# Patient Record
Sex: Male | Born: 1975 | Race: Black or African American | Hispanic: No | Marital: Single | Smoking: Former smoker
Health system: Southern US, Community
[De-identification: ages and names within clinical notes are randomized; demographics above are authoritative.]

## PROBLEM LIST (undated history)

## (undated) ENCOUNTER — Ambulatory Visit (HOSPITAL_COMMUNITY): Discharge status: Absent without leave | Payer: Self-pay

## (undated) DIAGNOSIS — M549 Dorsalgia, unspecified: Secondary | ICD-10-CM

## (undated) DIAGNOSIS — E119 Type 2 diabetes mellitus without complications: Secondary | ICD-10-CM

## (undated) DIAGNOSIS — I10 Essential (primary) hypertension: Secondary | ICD-10-CM

## (undated) HISTORY — DX: Essential (primary) hypertension: I10

---

## 2008-12-16 ENCOUNTER — Emergency Department (HOSPITAL_COMMUNITY): Admission: EM | Admit: 2008-12-16 | Discharge: 2008-12-16 | Payer: Self-pay | Admitting: Emergency Medicine

## 2009-10-05 ENCOUNTER — Emergency Department (HOSPITAL_COMMUNITY): Admission: EM | Admit: 2009-10-05 | Discharge: 2009-10-05 | Payer: Self-pay | Admitting: Emergency Medicine

## 2016-01-02 ENCOUNTER — Encounter (HOSPITAL_COMMUNITY): Payer: Self-pay

## 2016-01-02 ENCOUNTER — Inpatient Hospital Stay (HOSPITAL_COMMUNITY)
Admission: EM | Admit: 2016-01-02 | Discharge: 2016-01-05 | DRG: 581 | Disposition: A | Discharge status: Home / Self Care | Payer: Medicaid Other | Attending: Internal Medicine | Admitting: Internal Medicine

## 2016-01-02 DIAGNOSIS — L0231 Cutaneous abscess of buttock: Principal | ICD-10-CM | POA: Diagnosis present

## 2016-01-02 DIAGNOSIS — E876 Hypokalemia: Secondary | ICD-10-CM | POA: Diagnosis present

## 2016-01-02 DIAGNOSIS — Z87891 Personal history of nicotine dependence: Secondary | ICD-10-CM

## 2016-01-02 DIAGNOSIS — R739 Hyperglycemia, unspecified: Secondary | ICD-10-CM | POA: Diagnosis present

## 2016-01-02 DIAGNOSIS — E1165 Type 2 diabetes mellitus with hyperglycemia: Secondary | ICD-10-CM | POA: Diagnosis present

## 2016-01-02 DIAGNOSIS — Z87898 Personal history of other specified conditions: Secondary | ICD-10-CM

## 2016-01-02 DIAGNOSIS — Z833 Family history of diabetes mellitus: Secondary | ICD-10-CM

## 2016-01-02 DIAGNOSIS — E119 Type 2 diabetes mellitus without complications: Secondary | ICD-10-CM

## 2016-01-02 DIAGNOSIS — I10 Essential (primary) hypertension: Secondary | ICD-10-CM | POA: Diagnosis present

## 2016-01-02 HISTORY — DX: Type 2 diabetes mellitus without complications: E11.9

## 2016-01-02 LAB — CBG MONITORING, ED: Glucose-Capillary: 325 mg/dL — ABNORMAL HIGH (ref 65–99)

## 2016-01-02 MED ORDER — ACETAMINOPHEN 325 MG PO TABS
650.0000 mg | ORAL_TABLET | Freq: Once | ORAL | Status: AC | PRN
  Administered 2016-01-02: 650 mg via ORAL
  Filled 2016-01-02: qty 2

## 2016-01-02 NOTE — ED Triage Notes (Signed)
PT C/O AN ABSCESS TO THE RIGHT BUTTOCK X3 DAYS WITH MINIMAL DRAINAGE AND FEVER.

## 2016-01-03 ENCOUNTER — Encounter (HOSPITAL_COMMUNITY): Payer: Self-pay

## 2016-01-03 ENCOUNTER — Emergency Department (HOSPITAL_COMMUNITY): Payer: Medicaid Other

## 2016-01-03 DIAGNOSIS — R7309 Other abnormal glucose: Secondary | ICD-10-CM

## 2016-01-03 DIAGNOSIS — R739 Hyperglycemia, unspecified: Secondary | ICD-10-CM | POA: Diagnosis present

## 2016-01-03 DIAGNOSIS — I10 Essential (primary) hypertension: Secondary | ICD-10-CM | POA: Diagnosis present

## 2016-01-03 DIAGNOSIS — Z87898 Personal history of other specified conditions: Secondary | ICD-10-CM

## 2016-01-03 DIAGNOSIS — L0231 Cutaneous abscess of buttock: Secondary | ICD-10-CM | POA: Diagnosis present

## 2016-01-03 DIAGNOSIS — E876 Hypokalemia: Secondary | ICD-10-CM | POA: Diagnosis present

## 2016-01-03 DIAGNOSIS — E1165 Type 2 diabetes mellitus with hyperglycemia: Secondary | ICD-10-CM | POA: Diagnosis present

## 2016-01-03 DIAGNOSIS — Z87891 Personal history of nicotine dependence: Secondary | ICD-10-CM | POA: Diagnosis not present

## 2016-01-03 DIAGNOSIS — Z833 Family history of diabetes mellitus: Secondary | ICD-10-CM | POA: Diagnosis not present

## 2016-01-03 LAB — CBC WITH DIFFERENTIAL/PLATELET
BASOS PCT: 0 %
Basophils Absolute: 0 10*3/uL (ref 0.0–0.1)
Eosinophils Absolute: 0 10*3/uL (ref 0.0–0.7)
Eosinophils Relative: 0 %
HEMATOCRIT: 40.1 % (ref 39.0–52.0)
HEMOGLOBIN: 14.4 g/dL (ref 13.0–17.0)
LYMPHS ABS: 2.7 10*3/uL (ref 0.7–4.0)
LYMPHS PCT: 22 %
MCH: 29.9 pg (ref 26.0–34.0)
MCHC: 35.9 g/dL (ref 30.0–36.0)
MCV: 83.2 fL (ref 78.0–100.0)
MONO ABS: 1 10*3/uL (ref 0.1–1.0)
MONOS PCT: 8 %
NEUTROS ABS: 8.3 10*3/uL — AB (ref 1.7–7.7)
Neutrophils Relative %: 70 %
Platelets: 174 10*3/uL (ref 150–400)
RBC: 4.82 MIL/uL (ref 4.22–5.81)
RDW: 11.7 % (ref 11.5–15.5)
WBC: 12 10*3/uL — ABNORMAL HIGH (ref 4.0–10.5)

## 2016-01-03 LAB — BASIC METABOLIC PANEL
ANION GAP: 10 (ref 5–15)
BUN: 12 mg/dL (ref 6–20)
CALCIUM: 8.6 mg/dL — AB (ref 8.9–10.3)
CHLORIDE: 100 mmol/L — AB (ref 101–111)
CO2: 22 mmol/L (ref 22–32)
Creatinine, Ser: 0.81 mg/dL (ref 0.61–1.24)
GFR calc Af Amer: >60 mL/min (ref >60)
GFR calc non Af Amer: >60 mL/min (ref >60)
GLUCOSE: 250 mg/dL — AB (ref 65–99)
Potassium: 3.3 mmol/L — ABNORMAL LOW (ref 3.5–5.1)
Sodium: 132 mmol/L — ABNORMAL LOW (ref 135–145)

## 2016-01-03 LAB — GLUCOSE, CAPILLARY
GLUCOSE-CAPILLARY: 279 mg/dL — AB (ref 65–99)
GLUCOSE-CAPILLARY: 240 mg/dL — AB (ref 65–99)
GLUCOSE-CAPILLARY: 262 mg/dL — AB (ref 65–99)

## 2016-01-03 MED ORDER — FENTANYL CITRATE (PF) 100 MCG/2ML IJ SOLN
50.0000 ug | Freq: Once | INTRAMUSCULAR | Status: AC
  Administered 2016-01-03: 50 ug via INTRAVENOUS
  Filled 2016-01-03: qty 2

## 2016-01-03 MED ORDER — LIDOCAINE HCL 1 % IJ SOLN
INTRAMUSCULAR | Status: AC
  Administered 2016-01-03: 20 mL
  Filled 2016-01-03: qty 20

## 2016-01-03 MED ORDER — HYDROCODONE-ACETAMINOPHEN 5-325 MG PO TABS
1.0000 | ORAL_TABLET | ORAL | Status: DC | PRN
  Administered 2016-01-03 – 2016-01-04 (×4): 2 via ORAL
  Filled 2016-01-03 (×5): qty 2

## 2016-01-03 MED ORDER — PIPERACILLIN-TAZOBACTAM 3.375 G IVPB
3.3750 g | Freq: Once | INTRAVENOUS | Status: AC
  Administered 2016-01-03: 3.375 g via INTRAVENOUS
  Filled 2016-01-03: qty 50

## 2016-01-03 MED ORDER — ACETAMINOPHEN 650 MG RE SUPP: 650.0000 mg | Freq: Four times a day (QID) | RECTAL | Status: DC | PRN

## 2016-01-03 MED ORDER — VANCOMYCIN HCL IN DEXTROSE 1-5 GM/200ML-% IV SOLN
1000.0000 mg | Freq: Once | INTRAVENOUS | Status: AC
  Administered 2016-01-03: 1000 mg via INTRAVENOUS
  Filled 2016-01-03: qty 200

## 2016-01-03 MED ORDER — PIPERACILLIN-TAZOBACTAM 3.375 G IVPB
3.3750 g | Freq: Three times a day (TID) | INTRAVENOUS | Status: DC
  Administered 2016-01-03 – 2016-01-05 (×6): 3.375 g via INTRAVENOUS
  Filled 2016-01-03 (×6): qty 50

## 2016-01-03 MED ORDER — SODIUM CHLORIDE 0.9 % IV SOLN
INTRAVENOUS | Status: DC
  Administered 2016-01-03 – 2016-01-05 (×5): via INTRAVENOUS

## 2016-01-03 MED ORDER — SODIUM CHLORIDE 0.9 % IV BOLUS (SEPSIS)
1000.0000 mL | Freq: Once | INTRAVENOUS | Status: AC
  Administered 2016-01-03: 1000 mL via INTRAVENOUS

## 2016-01-03 MED ORDER — VANCOMYCIN HCL 10 G IV SOLR
2000.0000 mg | INTRAVENOUS | Status: AC
  Administered 2016-01-03: 2000 mg via INTRAVENOUS
  Filled 2016-01-03: qty 2000

## 2016-01-03 MED ORDER — LIVING WELL WITH DIABETES BOOK
Freq: Once | Status: AC
  Administered 2016-01-03: 13:00:00
  Filled 2016-01-03: qty 1

## 2016-01-03 MED ORDER — BUPIVACAINE HCL (PF) 0.5 % IJ SOLN
20.0000 mL | Freq: Once | INTRAMUSCULAR | Status: AC
  Administered 2016-01-03: 20 mL
  Filled 2016-01-03: qty 30

## 2016-01-03 MED ORDER — IOPAMIDOL (ISOVUE-300) INJECTION 61%
100.0000 mL | Freq: Once | INTRAVENOUS | Status: AC | PRN
  Administered 2016-01-03: 100 mL via INTRAVENOUS

## 2016-01-03 MED ORDER — INSULIN ASPART 100 UNIT/ML ~~LOC~~ SOLN
0.0000 [IU] | Freq: Three times a day (TID) | SUBCUTANEOUS | Status: DC
  Administered 2016-01-03: 3 [IU] via SUBCUTANEOUS
  Administered 2016-01-03: 5 [IU] via SUBCUTANEOUS
  Administered 2016-01-04: 2 [IU] via SUBCUTANEOUS
  Administered 2016-01-04: 3 [IU] via SUBCUTANEOUS
  Administered 2016-01-04 – 2016-01-05 (×2): 1 [IU] via SUBCUTANEOUS

## 2016-01-03 MED ORDER — ACETAMINOPHEN 325 MG PO TABS: 650.0000 mg | ORAL_TABLET | Freq: Four times a day (QID) | ORAL | Status: DC | PRN

## 2016-01-03 MED ORDER — VANCOMYCIN HCL IN DEXTROSE 1-5 GM/200ML-% IV SOLN
1000.0000 mg | Freq: Three times a day (TID) | INTRAVENOUS | Status: DC
  Administered 2016-01-03 – 2016-01-04 (×3): 1000 mg via INTRAVENOUS
  Filled 2016-01-03 (×4): qty 200

## 2016-01-03 NOTE — H&P (Signed)
History and Physical    Rodney Powell YNW:[id number] DOB: 1975-09-11 DOA: 01/02/2016  PCP: No PCP Per Patient. Patient has not had a PCP for 5 years. Patient coming from: Home  Chief Complaint: Abscess  HPI: Rodney Powell is a 40 y.o. male with medical history significant of tobacco abuse. Four days ago, patient reports noticing development of a painful abscess on his right buttock. He started to develop associated subjective fevers, chills and nausea. Symptoms continued to worsen so he came for evaluation. He has not tried anything to help with pain or symptoms.   ED Course: Vitals: Mildly elevated temperature. Vitals wnl and stable. Labs: Mildly hyponatremic and hypokalemic. Elevated wbc of 12k. Blood glucose of 325 Imaging: CT abdomen/pelvis significant for complex abscess containing gas Medications/Course: Patient started on vancomycin and zosyn. I&D performed in ED.   Review of Systems: Review of Systems  Constitutional: Positive for chills, fever and malaise/fatigue.  Respiratory: Negative for cough, sputum production, shortness of breath and wheezing.   Cardiovascular: Negative for chest pain (history of sharp chest pain for years).  Gastrointestinal: Positive for nausea. Negative for abdominal pain, blood in stool, constipation and diarrhea.  Skin:       Abscess  All other systems reviewed and are negative.   Past Medical History:  Diagnosis Date  . Diabetes mellitus without complication (HCC)    TYPE II    History reviewed. No pertinent surgical history.   reports that he has been smoking Cigarettes.  He has a 22.00 pack-year smoking history. He has never used smokeless tobacco. He reports that he drinks alcohol. He reports that he does not use drugs.  No Known Allergies  Family History  Problem Relation Age of Onset  . Diabetes Mother   . Diabetes Brother   . Diabetes Maternal Grandmother     Prior to Admission medications   Not on File    Physical  Exam: Vitals:   01/03/16 0402 01/03/16 0604 01/03/16 0736 01/03/16 0755  BP: 122/70 132/74 116/75 136/72  Pulse: 95 107 92 97  Resp: 18  16 18   Temp: 98.1 F (36.7 C)  98.3 F (36.8 C) 100.1 F (37.8 C)  TempSrc: Oral  Oral Oral  SpO2: 100% 97% 98% 98%  Weight:    95.7 kg (211 lb)  Height:    5\' 8"  (1.727 m)     Constitutional: NAD, calm, comfortable Eyes: PERRL, lids and conjunctivae normal ENMT: Mucous membranes are moist. Posterior pharynx clear of any exudate or lesions.Normal dentition.  Neck: normal, supple, no masses, no thyromegaly Respiratory: clear to auscultation bilaterally, no wheezing, no crackles. Normal respiratory effort. No accessory muscle use.  Cardiovascular: Regular rate and rhythm, no murmurs / rubs / gallops. No extremity edema. 2+ pedal pulses. No carotid bruits.  Abdomen: no tenderness, no masses palpated. No hepatosplenomegaly. Bowel sounds positive.  Musculoskeletal: no clubbing / cyanosis. No joint deformity upper and lower extremities. Good ROM, no contractures. Normal muscle tone.  Skin: Small incision on right buttock. Significant area of surrounding induration. Skin is not erythematous. Area is tender. Neurologic: CN 2-12 grossly intact. Sensation intact, DTR normal. Strength 5/5 in all 4.  Psychiatric: Normal judgment and insight. Alert and oriented x 3. Normal mood.   Labs on Admission: I have personally reviewed following labs and imaging studies  CBC:  Recent Labs Lab 01/03/16 0027  WBC 12.0*  NEUTROABS 8.3*  HGB 14.4  HCT 40.1  MCV 83.2  PLT 174   Basic Metabolic Panel:  Recent Labs Lab 01/03/16 0027  NA 132*  K 3.3*  CL 100*  CO2 22  GLUCOSE 250*  BUN 12  CREATININE 0.81  CALCIUM 8.6*   GFR: Estimated Creatinine Clearance: 134.6 mL/min (by C-G formula based on SCr of 0.81 mg/dL).  CBG:  Recent Labs Lab 01/02/16 2032  GLUCAP 325*   Radiological Exams on Admission: Ct Abdomen Pelvis W Contrast  Result Date:  01/03/2016 CLINICAL DATA:  40 year old male with fever and chills. Perirectal abscess. EXAM: CT ABDOMEN AND PELVIS WITH CONTRAST TECHNIQUE: Multidetector CT imaging of the abdomen and pelvis was performed using the standard protocol following bolus administration of intravenous contrast. CONTRAST:  100mL ISOVUE-300 IOPAMIDOL (ISOVUE-300) INJECTION 61% COMPARISON:  None. FINDINGS: The visualized lung bases are clear. No intra-abdominal free air or free fluid. Probable mild fatty infiltration of the liver. The gallbladder is predominantly collapsed. No calcified gallstone. The pancreas, spleen, adrenal glands, kidneys, visualized ureters, and urinary bladder appear unremarkable. The prostate and seminal vesicles are grossly unremarkable. There is moderate stool throughout the colon. No evidence of bowel obstruction or active inflammation. Normal appendix. The abdominal aorta and IVC appear unremarkable. No portal venous gas identified. There is no adenopathy. There is a small fat containing umbilical hernia. There is a 3.8 x 3.9 cm complex collection containing pockets of gas in the subcutaneous soft tissues of the right gluteal region most compatible with phlegmon as changes or early abscess formation. There is induration and inflammation of the surrounding soft tissues. The remainder of the soft tissues appear unremarkable. There is mild degenerative changes of the spine with disc desiccation at L3-L4. 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 No listhesis. No acute fracture. IMPRESSION: Focal complex collection containing pockets of gas and small amount of fluid in the subcutaneous soft tissues of the right gluteal region compatible with headache mild/early abscess formation. No other acute intra-abdominal pelvic pathology identified. Electronically Signed   By: Elgie CollardArash  Radparvar M.D.   On: 01/03/2016 02:37    EKG: None obtained  Assessment/Plan Principal Problem:   Abscess of right buttock Active  Problems:   Elevated blood sugar   History of chest pain  Abscess, right buttock S/p I&D. Concern for remaining purulent material. General surgery consulted. Antibiotics initiated. Vitals stable. -follow-up general surgery recommendations -continue antibiotics  Elevated blood sugar >200 random blood sugar. ?history of diabetes (patient states no official diagnosis). Significant family history -SSI -Hemoglobin A1c  History of chest pain Does not sound typical (sharp, random). Patient is higher risk. No current chest pain -baseline EKG -outpatient cardiology follow-up   DVT prophylaxis: SCDs Code Status: Full code Family Communication: None at bedside Disposition Plan: Likely discharge in 1-2 days Consults called: General surgery by ED Admission status: Inpatient, medical floor   Jacquelin Hawkingalph Nettey, MD [facility] Pager [phone]  If 7PM-7AM, please contact night-coverage [email or web address] Password [email or web address]  01/03/2016, 9:29 AM

## 2016-01-03 NOTE — Plan of Care (Signed)
40 year old male presents with buttock abscess. CT scan shows fluid collection with gas. ER physician did do incision and drainage. But states that there was copious amount of drainage. So patient will be admitted for IV antibiotics and ER physician will be also requesting surgical consult. Patient also has possibly new onset diabetes mellitus.  Rodney Powell.

## 2016-01-03 NOTE — Progress Notes (Addendum)
Pharmacy Antibiotic Note  Ferman HammingDwayne Whobrey is a 40 y.o. male admitted on 01/02/2016 with painful abscess of right buttocks.  S/p I&D and initial antibiotics given in ED, surgery consulted.  Pharmacy has been consulted for Vancomycin and Zosyn dosing.  CrCl > 100 ml/min WBC 12 Tm 100.1  Plan: Zosyn 3.375g IV Q8H infused over 4hrs.  Vancomycin 2000mg  IV once, then 1g IV q8h. Measure Vanc trough at steady state. Follow up renal fxn, culture results, and clinical course.   Height: 5\' 8"  (172.7 cm) Weight: 211 lb (95.7 kg) IBW/kg (Calculated) : 68.4  Temp (24hrs), Avg:99.1 F (37.3 C), Min:98.1 F (36.7 C), Max:100.1 F (37.8 C)   Recent Labs Lab 01/03/16 0027  WBC 12.0*  CREATININE 0.81    Estimated Creatinine Clearance: 134.6 mL/min (by C-G formula based on SCr of 0.81 mg/dL).    No Known Allergies  Antimicrobials this admission: 9/26 Zosyn >>  9/26 Vancomycin >>   Dose adjustments this admission:  Microbiology results: 9/26 BCx: sent 9/26 UCx: sent  Thank you for allowing pharmacy to be a part of this patient's care.  Lynann Beaverhristine Anmol Paschen PharmD, BCPS Pager [phone] 01/03/2016 12:54 PM

## 2016-01-03 NOTE — ED Notes (Signed)
C/o abscess to right posterior thigh/buttocks X3 days. Patient states fever and pain

## 2016-01-03 NOTE — Consult Note (Signed)
Reason for Consult:Right buttocks abscess Referring Physician: Dr. Daleen Bo knapp  Rodney Powell is an 40 y.o. male.  HPI: Pt presented to the ED with an abscess to the right buttocks with some drainage and fever that started about 3 days ago with fever and chills for 2 days.  NO PCP for about 3 years. Work up in the ED shows pt is hypertensive, Na 132, glucose 325, WBC 12 K, CT scan shows:  There is a small fat containing umbilical hernia. There is a 3.8 x 3.9 cm complex collection containing pockets of gas in the subcutaneous soft tissues of the right gluteal region most compatible with phlegmon as changes or early abscess formation. There is induration and inflammation of the surrounding soft tissues. The remainder of the soft tissues appear unremarkable.  Pt had an I&D of the site, has been admitted by Medicine for treatment and we are ask to see.   Past Medical History:  Diagnosis Date  . Diabetes mellitus without complication ([place])  Diagnosed as prediabetic 6 years ago, never treated.    TYPE II    History reviewed. No pertinent surgical history.  History reviewed. No pertinent family history.  Social History:  reports that he has never smoked. He has never used smokeless tobacco. He reports that he does not drink alcohol or use drugs. Tobacco:  1 PPD x 22 years ETOH:  + use,  Drugs:  None  Lives with son age 66 and girlfriend Allergies: No Known Allergies  Prior to Admission medications   Not on File    Continuous: . sodium chloride 100 mL/hr at 01/03/16 0402   PRN: Anti-infectives    Start     Dose/Rate Route Frequency Ordered Stop   01/03/16 0630  piperacillin-tazobactam (ZOSYN) IVPB 3.375 g     3.375 g 12.5 mL/hr over 240 Minutes Intravenous  Once 01/03/16 0615 01/03/16 0722   01/03/16 0315  vancomycin (VANCOCIN) IVPB 1000 mg/200 mL premix     1,000 mg 200 mL/hr over 60 Minutes Intravenous  Once 01/03/16 0310 01/03/16 0603      Results for orders placed or  performed during the hospital encounter of 01/02/16 (from the past 48 hour(s))  CBG monitoring, ED     Status: Abnormal   Collection Time: 01/02/16  8:32 PM  Result Value Ref Range   Glucose-Capillary 325 (H) 65 - 99 mg/dL  Basic metabolic panel     Status: Abnormal   Collection Time: 01/03/16 12:27 AM  Result Value Ref Range   Sodium 132 (L) 135 - 145 mmol/L   Potassium 3.3 (L) 3.5 - 5.1 mmol/L   Chloride 100 (L) 101 - 111 mmol/L   CO2 22 22 - 32 mmol/L   Glucose, Bld 250 (H) 65 - 99 mg/dL   BUN 12 6 - 20 mg/dL   Creatinine, Ser 0.81 0.61 - 1.24 mg/dL   Calcium 8.6 (L) 8.9 - 10.3 mg/dL   GFR calc non Af Amer >60 >60 mL/min   GFR calc Af Amer >60 >60 mL/min    Comment: (NOTE) The eGFR has been calculated using the CKD EPI equation. This calculation has not been validated in all clinical situations. eGFR's persistently <60 mL/min signify possible Chronic Kidney Disease.    Anion gap 10 5 - 15  CBC with Differential     Status: Abnormal   Collection Time: 01/03/16 12:27 AM  Result Value Ref Range   WBC 12.0 (H) 4.0 - 10.5 K/uL   RBC 4.82 4.22 -  5.81 MIL/uL   Hemoglobin 14.4 13.0 - 17.0 g/dL   HCT 40.1 39.0 - 52.0 %   MCV 83.2 78.0 - 100.0 fL   MCH 29.9 26.0 - 34.0 pg   MCHC 35.9 30.0 - 36.0 g/dL   RDW 11.7 11.5 - 15.5 %   Platelets 174 150 - 400 K/uL   Neutrophils Relative % 70 %   Neutro Abs 8.3 (H) 1.7 - 7.7 K/uL   Lymphocytes Relative 22 %   Lymphs Abs 2.7 0.7 - 4.0 K/uL   Monocytes Relative 8 %   Monocytes Absolute 1.0 0.1 - 1.0 K/uL   Eosinophils Relative 0 %   Eosinophils Absolute 0.0 0.0 - 0.7 K/uL   Basophils Relative 0 %   Basophils Absolute 0.0 0.0 - 0.1 K/uL    Ct Abdomen Pelvis W Contrast  Result Date: 01/03/2016 CLINICAL DATA:  40 year old male with fever and chills. Perirectal abscess. EXAM: CT ABDOMEN AND PELVIS WITH CONTRAST TECHNIQUE: Multidetector CT imaging of the abdomen and pelvis was performed using the standard protocol following bolus  administration of intravenous contrast. CONTRAST:  121m ISOVUE-300 IOPAMIDOL (ISOVUE-300) INJECTION 61% COMPARISON:  None. FINDINGS: The visualized lung bases are clear. No intra-abdominal free air or free fluid. Probable mild fatty infiltration of the liver. The gallbladder is predominantly collapsed. No calcified gallstone. The pancreas, spleen, adrenal glands, kidneys, visualized ureters, and urinary bladder appear unremarkable. The prostate and seminal vesicles are grossly unremarkable. There is moderate stool throughout the colon. No evidence of bowel obstruction or active inflammation. Normal appendix. The abdominal aorta and IVC appear unremarkable. No portal venous gas identified. There is no adenopathy. There is a small fat containing umbilical hernia. There is a 3.8 x 3.9 cm complex collection containing pockets of gas in the subcutaneous soft tissues of the right gluteal region most compatible with phlegmon as changes or early abscess formation. There is induration and inflammation of the surrounding soft tissues. The remainder of the soft tissues appear unremarkable. There is mild degenerative changes of the spine with disc desiccation at L3-L4. 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 No listhesis. No acute fracture. IMPRESSION: Focal complex collection containing pockets of gas and small amount of fluid in the subcutaneous soft tissues of the right gluteal region compatible with headache mild/early abscess formation. No other acute intra-abdominal pelvic pathology identified. Electronically Signed   By: AAnner CreteM.D.   On: 01/03/2016 02:37    Review of Systems  Constitutional: Positive for chills, fever and weight loss (20 lbs over 3 months, eating better.).  HENT: Negative.   Eyes: Negative.   Respiratory: Negative.   Cardiovascular: Positive for chest pain (Intermittent, not related to activity.  Less pain with lower weight.). Negative for palpitations, orthopnea, claudication, leg  swelling and PND.  Gastrointestinal: Negative.  Negative for heartburn, nausea and vomiting.  Genitourinary: Negative.   Musculoskeletal: Negative.   Skin: Negative.        Complains of pain left thigh knee to thigh, feels like each hair has splinters going into his skin.  Just left thigh  Neurological: Negative.   Endo/Heme/Allergies: Negative.   Psychiatric/Behavioral: Negative.    Blood pressure 132/74, pulse 107, temperature 98.1 F (36.7 C), temperature source Oral, resp. rate 18, height 5' 8"  (1.727 m), weight 104.3 kg (230 lb), SpO2 97 %. Physical Exam  Constitutional: He is oriented to person, place, and time. He appears well-developed and well-nourished. No distress.  HENT:  Head: Normocephalic and atraumatic.  Eyes: Right eye  exhibits no discharge. Left eye exhibits no discharge. No scleral icterus.  Neck: Normal range of motion. Neck supple. No JVD present. No tracheal deviation present. No thyromegaly present.  Cardiovascular: Normal rate, regular rhythm, normal heart sounds and intact distal pulses.   No murmur heard. Respiratory: Effort normal and breath sounds normal. No respiratory distress. He has no wheezes. He has no rales. He exhibits no tenderness.  GI: Soft. Bowel sounds are normal. He exhibits no distension and no mass. There is no tenderness. There is no rebound and no guarding.  Genitourinary:  Genitourinary Comments: Right buttocks has a <1cm I&D site draining allot of brown malodorous fluid.  Site is indurated and a little erythematous, about 3-4 cm in diameter.    Musculoskeletal: He exhibits edema and tenderness (tender left anterior thigh). He exhibits no deformity.  Lymphadenopathy:    He has no cervical adenopathy.  Neurological: He is alert and oriented to person, place, and time. A cranial nerve deficit is present.  Skin: Skin is warm and dry. No rash noted. He is not diaphoretic. No erythema. No pallor.  Psychiatric: He has a normal mood and affect. His  behavior is normal. Judgment and thought content normal.    Assessment/Plan: Right buttocks abscess - draining thru I&D done in ED AODM with no treatment Probable diabetic neuropathy left anterior thigh Intermittent chest pain   Plan: K pad/Sitz bath today, continue antibiotics, reexamine tomorrow.  NPO after MN.  We will decide on taking to OR tomorrow after 24 hours on abx.    Rodney Powell 01/03/2016, 7:19 AM

## 2016-01-03 NOTE — ED Provider Notes (Signed)
[facility]-EMERGENCY DEPT Provider Note   CSN: [id number] Arrival date & time: 01/02/16  1925 By signing my name below, I, Levon Hedger, attest that this documentation has been prepared under the direction and in the presence of Devoria Albe, MD . Electronically Signed: Levon Hedger, Scribe. 01/03/2016. 12:49 AM.   Time seen 12:32 AM  History   Chief Complaint Chief Complaint  Patient presents with  . Abscess   HPI Rodney Powell is a 40 y.o. male with hx of being borderline diabetic who presents to the Emergency Department complaining of lesion to right buttock onset three days ago. He notes associated fever, chills onset two days ago. Pt has not been followed by a PCP in about 3 years. He is not on any medication or ever taken medication for DM or HTN. Pt denies any tobacco use. He is an occasional drinker. Pt is a Production designer, theatre/television/film at [employer]. He denies drainage, increased thirst, increased urinary frequency. No medication taken PTA. No alleviating or modifying factors noted, other than it hurts and he can't sit on his right buttock. He reports one prior episode of an abscess about 5 years ago.    The history is provided by the patient. No language interpreter was used.     Past Medical History:  Diagnosis Date  . Diabetes mellitus without complication (HCC)    TYPE II    Patient Active Problem List   Diagnosis Date Noted  . Abscess of right buttock 01/03/2016   History reviewed. No pertinent surgical history.   Home Medications    Prior to Admission medications   Not on File    Family History History reviewed. No pertinent family history.  Social History Social History  Substance Use Topics  . Smoking status: Never Smoker  . Smokeless tobacco: Never Used  . Alcohol use No   Allergies   Review of patient's allergies indicates no known allergies.  Review of Systems Review of Systems  All other systems reviewed and are negative.  10 systems reviewed and all are negative for  acute change except as noted in the HPI.   Physical Exam Updated Vital Signs BP (!) 149/102 (BP Location: Right Arm)   Pulse 111   Temp 99.9 F (37.7 C) (Oral)   Resp 20   Ht 5\' 8"  (1.727 m)   Wt 230 lb (104.3 kg)   SpO2 100%   BMI 34.97 kg/m   Vital signs normal except for tachycardia and hypertension    Physical Exam  Constitutional: He is oriented to person, place, and time. He appears well-developed and well-nourished.  Non-toxic appearance. He does not appear ill. No distress.  HENT:  Head: Normocephalic and atraumatic.  Right Ear: External ear normal.  Left Ear: External ear normal.  Nose: Nose normal. No mucosal edema or rhinorrhea.  Mouth/Throat: Oropharynx is clear and moist and mucous membranes are normal. No dental abscesses or uvula swelling.  Dry mucus membranes.  Eyes: Conjunctivae and EOM are normal. Pupils are equal, round, and reactive to light.  Neck: Normal range of motion and full passive range of motion without pain. Neck supple.  Cardiovascular: Regular rhythm and normal heart sounds.  Tachycardia present.  Exam reveals no gallop and no friction rub.   No murmur heard. Pulmonary/Chest: Effort normal and breath sounds normal. No respiratory distress. He has no wheezes. He has no rhonchi. He has no rales. He exhibits no tenderness and no crepitus.  Abdominal: Soft. Normal appearance and bowel sounds are normal. He exhibits no  distension. There is no tenderness. There is no rebound and no guarding.  Musculoskeletal: Normal range of motion. He exhibits no edema or tenderness.  Neurological: He is alert and oriented to person, place, and time. He has normal strength. No cranial nerve deficit.  Skin: Skin is warm, dry and intact. No rash noted. No erythema. No pallor.     Induration to mid-right buttock medially, 4x7 cm in size that extends towards the perirectal area   Psychiatric: He has a normal mood and affect. His speech is normal and behavior is normal.  His mood appears not anxious.  Nursing note and vitals reviewed.  ED Treatments / Results  Labs (all labs ordered are listed, but only abnormal results are displayed) Results for orders placed or performed during the hospital encounter of 01/02/16  Basic metabolic panel  Result Value Ref Range   Sodium 132 (L) 135 - 145 mmol/L   Potassium 3.3 (L) 3.5 - 5.1 mmol/L   Chloride 100 (L) 101 - 111 mmol/L   CO2 22 22 - 32 mmol/L   Glucose, Bld 250 (H) 65 - 99 mg/dL   BUN 12 6 - 20 mg/dL   Creatinine, Ser 1.61 0.61 - 1.24 mg/dL   Calcium 8.6 (L) 8.9 - 10.3 mg/dL   GFR calc non Af Amer >60 >60 mL/min   GFR calc Af Amer >60 >60 mL/min   Anion gap 10 5 - 15  CBC with Differential  Result Value Ref Range   WBC 12.0 (H) 4.0 - 10.5 K/uL   RBC 4.82 4.22 - 5.81 MIL/uL   Hemoglobin 14.4 13.0 - 17.0 g/dL   HCT 09.6 04.5 - 40.9 %   MCV 83.2 78.0 - 100.0 fL   MCH 29.9 26.0 - 34.0 pg   MCHC 35.9 30.0 - 36.0 g/dL   RDW 81.1 91.4 - 78.2 %   Platelets 174 150 - 400 K/uL   Neutrophils Relative % 70 %   Neutro Abs 8.3 (H) 1.7 - 7.7 K/uL   Lymphocytes Relative 22 %   Lymphs Abs 2.7 0.7 - 4.0 K/uL   Monocytes Relative 8 %   Monocytes Absolute 1.0 0.1 - 1.0 K/uL   Eosinophils Relative 0 %   Eosinophils Absolute 0.0 0.0 - 0.7 K/uL   Basophils Relative 0 %   Basophils Absolute 0.0 0.0 - 0.1 K/uL  CBG monitoring, ED  Result Value Ref Range   Glucose-Capillary 325 (H) 65 - 99 mg/dL   Laboratory interpretation all normal except improving hyperglycemia, leukocytosis     EKG  EKG Interpretation None       Radiology Ct Abdomen Pelvis W Contrast  Result Date: 01/03/2016 CLINICAL DATA:  40 year old male with fever and chills. Perirectal abscess. EXAM: CT ABDOMEN AND PELVIS WITH CONTRAST TECHNIQUE: Multidetector CT imaging of the abdomen and pelvis was performed using the standard protocol following bolus administration of intravenous contrast. CONTRAST:  ISOVUE-300 IOPAMIDOL (ISOVUE-300)  INJECTION 61% COMPARISON:  None. FINDINGS: The visualized lung bases are clear. No intra-abdominal free air or free fluid. Probable mild fatty infiltration of the liver. The gallbladder is predominantly collapsed. No calcified gallstone. The pancreas, spleen, adrenal glands, kidneys, visualized ureters, and urinary bladder appear unremarkable. The prostate and seminal vesicles are grossly unremarkable. There is moderate stool throughout the colon. No evidence of bowel obstruction or active inflammation. Normal appendix. The abdominal aorta and IVC appear unremarkable. No portal venous gas identified. There is no adenopathy. There is a small fat containing umbilical hernia. There  is a 3.8 x 3.9 cm complex collection containing pockets of gas in the subcutaneous soft tissues of the right gluteal region most compatible with phlegmon as changes or early abscess formation. There is induration and inflammation of the surrounding soft tissues. The remainder of the soft tissues appear unremarkable. There is mild degenerative changes of the spine with disc desiccation at L3-L4. 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 No listhesis. No acute fracture. IMPRESSION: Focal complex collection containing pockets of gas and small amount of fluid in the subcutaneous soft tissues of the right gluteal region compatible with headache mild/early abscess formation. No other acute intra-abdominal pelvic pathology identified. Electronically Signed   By: Elgie CollardArash  Radparvar M.D.   On: 01/03/2016 02:37    Procedures Procedures (including critical care time)  Medications Ordered in ED Medications  0.9 %  sodium chloride infusion ( Intravenous New Bag/Given 01/03/16 0402)  piperacillin-tazobactam (ZOSYN) IVPB 3.375 g (not administered)  acetaminophen (TYLENOL) tablet 650 mg (650 mg Oral Given 01/02/16 2048)  sodium chloride 0.9 % bolus 1,000 mL (0 mLs Intravenous Stopped 01/03/16 0604)  fentaNYL (SUBLIMAZE) injection 50 mcg (50 mcg  Intravenous Given 01/03/16 0105)  bupivacaine (MARCAINE) 0.5 % injection 20 mL (20 mLs Infiltration Given 01/03/16 0108)  iopamidol (ISOVUE-300) 61 % injection 100 mL (100 mLs Intravenous Contrast Given 01/03/16 0135)  vancomycin (VANCOCIN) IVPB 1000 mg/200 mL premix (0 mg Intravenous Stopped 01/03/16 0603)  lidocaine (XYLOCAINE) 1 % (with pres) injection (20 mLs  Given 01/03/16 0600)  fentaNYL (SUBLIMAZE) injection 50 mcg (50 mcg Intravenous Given 01/03/16 0600)    Initial Impression / Assessment and Plan / ED Course  I have reviewed the triage vital signs and the nursing notes.  Pertinent labs & imaging results that were available during my care of the patient were reviewed by me and considered in my medical decision making (see chart for details).  Clinical Course  DIAGNOSTIC STUDIES:  Oxygen Saturation is 100% on RA, normal by my interpretation.    COORDINATION OF CARE:  12:43 AM Will order CBC and BMP. Discussed treatment plan with pt at bedside and pt agreed to plan. CT of the abdomen and pelvis was ordered to see if this abscess extends to the rectal/perirectal area. Patient was started on IV vancomycin.  After his CT resulted my nurse practitioner Manus RuddSchulz drain the abscess and so there was a lot of air and a lot of pockets and it had a copious amount of purulent drainage. With this in mind I talked to the patient and suggested he be admitted for IV antibiotics. Patient initially told me he wanted to be discharged home however he then changed his mind.  06:12 AM Dr Toniann FailKakrakandy, admit to hospitalist, request IV zosyn and consult surgery.  06:27 AM Dr Sheron NightingaleM Martin, surgery, will do consult, states will send his PA Will to see patient.   Final Clinical Impressions(s) / ED Diagnoses   Final diagnoses:  Abscess of buttock, right  New onset type 2 diabetes mellitus (HCC)    Plan admission  Devoria AlbeIva Tranika Scholler, MD, FACEP  I personally performed the services described in this documentation, which was  scribed in my presence. The recorded information has been reviewed and considered.   Devoria AlbeIva Xyler Terpening, MD, Concha PyoFACEP     Kasey Hansell, MD 01/03/16 0630

## 2016-01-03 NOTE — Progress Notes (Signed)
Inpatient Diabetes Program Recommendations  AACE/ADA: New Consensus Statement on Inpatient Glycemic Control (2015)  Target Ranges:  Prepandial:   less than 140 mg/dL      Peak postprandial:   less than 180 mg/dL (1-2 hours)      Critically ill patients:  140 - 180 mg/dL   Results for Rodney HammingHANKINS, Ida (MRN [id number]) as of 01/03/2016 10:10  Ref. Range 01/02/2016 20:32  Glucose-Capillary Latest Ref Range: 65 - 99 mg/dL 409325 (H)    Admit with: Abscess of R Buttock  History: "Borderline DM" per patient  Home DM Meds: None  Current Insulin Orders: Novolog Sensitive Correction Scale/ SSI (0-9 units) TID AC     -Current A1c pending.  Waiting for results to determine if this is indeed a new diagnosis of DM.  -Note Novolog SSI started at 12pm today.  -Spoke with pt and family member today about pt's elevated blood sugar levels.  Discussed with pt about what an A1c is and what it measures.  Explained to pt that the A1c results will help determine both: 1) If he has a definite diagnosis of DM and 2) How his glucose levels have been for the last 3 month period.  Patient stated he has family members with DM.  Discussed with pt and family that his sugars may have been running high at home and that the elevated blood sugars placed him at a higher risk for an infection/abscess or that the infection/abscess led to the elevated blood sugars.  Either way, I explained to pt that it is important to monitor his CBGs and control his CBGs to help with healing.  -Patient was given Living Well with DM booklet this AM.  RNs to review basic DM concepts with pt if given a definitive diagnosis of DM.  -Discussed with pt that we will be checking his CBGs frequently and giving him Novolog insulin if his CBGs are elevated.  Explained what Novolog insulin is and how it works.  Also discussed with pt that if the MDs diagnose him with DM that the nursing staff will begin providing basic DM education for him so he can learn  how to take care of his CBGs at home as well.  Encouraged pt to seek medical care with regular PCP after d/c.  Have consulted care management as well.    --Will follow patient during hospitalization--  Ambrose FinlandJeannine Johnston Alianna Wurster RN, MSN, CDE Diabetes Coordinator Inpatient Glycemic Control Team Team Pager: [phone] (8a-5p)

## 2016-01-03 NOTE — Care Management Note (Signed)
Case Management Note  Patient Details  Name: Rodney Powell MRN: [id number] Date of Birth: 07/18/1975  Subjective/Objective: 40 y/o m admitted w/R buttock abscess. From home, has support. Patient works.No pcp, no insurance. 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 Provided pcp listing-encouraged [facility], also provided w[facility] med list.   Continue to monitor for d/c needs.                 Action/Plan:d/c plan home.   Expected Discharge Date:   (unknown)               Expected Discharge Plan:  Home/Self Care  In-House Referral:     Discharge planning Services  CM Consult, [facility]igent [facility]  Post Acute Care Choice:    Choice offered to:     DME Arranged:    DME Agency:     HH Arranged:    HH Agency:     Status of Service:  In process, will continue to follow  If discussed at Long Length of Stay Meetings, dates discussed:    Additional Comments:  Lanier ClamMahabir, Tane Biegler, RN 01/03/2016, 4:10 PM

## 2016-01-04 ENCOUNTER — Inpatient Hospital Stay (HOSPITAL_COMMUNITY): Payer: Medicaid Other | Admitting: Anesthesiology

## 2016-01-04 ENCOUNTER — Encounter (HOSPITAL_COMMUNITY)
Admission: EM | Disposition: A | Discharge status: Home / Self Care | Payer: Self-pay | Source: Home / Self Care | Attending: Internal Medicine

## 2016-01-04 ENCOUNTER — Encounter (HOSPITAL_COMMUNITY): Payer: Self-pay | Admitting: Surgery

## 2016-01-04 HISTORY — PX: INCISION AND DRAINAGE PERIRECTAL ABSCESS: SHX1804

## 2016-01-04 LAB — CBC
HEMATOCRIT: 35.4 % — AB (ref 39.0–52.0)
HEMOGLOBIN: 12.6 g/dL — AB (ref 13.0–17.0)
MCH: 29.9 pg (ref 26.0–34.0)
MCHC: 35.6 g/dL (ref 30.0–36.0)
MCV: 84.1 fL (ref 78.0–100.0)
Platelets: 157 10*3/uL (ref 150–400)
RBC: 4.21 MIL/uL — ABNORMAL LOW (ref 4.22–5.81)
RDW: 11.9 % (ref 11.5–15.5)
WBC: 9 10*3/uL (ref 4.0–10.5)

## 2016-01-04 LAB — BASIC METABOLIC PANEL
ANION GAP: 5 (ref 5–15)
BUN: 8 mg/dL (ref 6–20)
CALCIUM: 8.1 mg/dL — AB (ref 8.9–10.3)
CO2: 26 mmol/L (ref 22–32)
Chloride: 107 mmol/L (ref 101–111)
Creatinine, Ser: 0.84 mg/dL (ref 0.61–1.24)
GFR calc Af Amer: >60 mL/min (ref >60)
Glucose, Bld: 203 mg/dL — ABNORMAL HIGH (ref 65–99)
POTASSIUM: 3.7 mmol/L (ref 3.5–5.1)
SODIUM: 138 mmol/L (ref 135–145)

## 2016-01-04 LAB — GLUCOSE, CAPILLARY
GLUCOSE-CAPILLARY: 220 mg/dL — AB (ref 65–99)
GLUCOSE-CAPILLARY: 166 mg/dL — AB (ref 65–99)
GLUCOSE-CAPILLARY: 148 mg/dL — AB (ref 65–99)
GLUCOSE-CAPILLARY: 155 mg/dL — AB (ref 65–99)
Glucose-Capillary: 166 mg/dL — ABNORMAL HIGH (ref 65–99)

## 2016-01-04 LAB — HEMOGLOBIN A1C
HEMOGLOBIN A1C: 11 % — AB (ref 4.8–5.6)
Mean Plasma Glucose: 269 mg/dL

## 2016-01-04 LAB — SURGICAL PCR SCREEN
MRSA, PCR: NEGATIVE
STAPHYLOCOCCUS AUREUS: NEGATIVE

## 2016-01-04 SURGERY — INCISION AND DRAINAGE, ABSCESS, PERIRECTAL
Anesthesia: Monitor Anesthesia Care | Site: Buttocks | Laterality: Right

## 2016-01-04 MED ORDER — FENTANYL CITRATE (PF) 100 MCG/2ML IJ SOLN
INTRAMUSCULAR | Status: AC
  Filled 2016-01-04: qty 2

## 2016-01-04 MED ORDER — PROPOFOL 10 MG/ML IV BOLUS
INTRAVENOUS | Status: AC
  Filled 2016-01-04: qty 20

## 2016-01-04 MED ORDER — HYDROMORPHONE HCL 1 MG/ML IJ SOLN: 1.0000 mg | INTRAMUSCULAR | Status: DC | PRN

## 2016-01-04 MED ORDER — ONDANSETRON HCL 4 MG/2ML IJ SOLN
INTRAMUSCULAR | Status: DC | PRN
  Administered 2016-01-04: 4 mg via INTRAVENOUS

## 2016-01-04 MED ORDER — ONDANSETRON HCL 4 MG/2ML IJ SOLN
INTRAMUSCULAR | Status: AC
  Filled 2016-01-04: qty 2

## 2016-01-04 MED ORDER — 0.9 % SODIUM CHLORIDE (POUR BTL) OPTIME
TOPICAL | Status: DC | PRN
  Administered 2016-01-04: 1000 mL

## 2016-01-04 MED ORDER — PROPOFOL 10 MG/ML IV BOLUS
INTRAVENOUS | Status: DC | PRN
  Administered 2016-01-04: 200 mg via INTRAVENOUS

## 2016-01-04 MED ORDER — PROMETHAZINE HCL 25 MG/ML IJ SOLN: 6.2500 mg | INTRAMUSCULAR | Status: DC | PRN

## 2016-01-04 MED ORDER — HYDROMORPHONE HCL 1 MG/ML IJ SOLN
INTRAMUSCULAR | Status: AC
  Administered 2016-01-04: 0.5 mg via INTRAVENOUS
  Filled 2016-01-04: qty 1

## 2016-01-04 MED ORDER — LIDOCAINE 2% (20 MG/ML) 5 ML SYRINGE
INTRAMUSCULAR | Status: DC | PRN
  Administered 2016-01-04: 80 mg via INTRAVENOUS

## 2016-01-04 MED ORDER — LIDOCAINE 2% (20 MG/ML) 5 ML SYRINGE
INTRAMUSCULAR | Status: AC
  Filled 2016-01-04: qty 5

## 2016-01-04 MED ORDER — MIDAZOLAM HCL 2 MG/2ML IJ SOLN
INTRAMUSCULAR | Status: AC
  Filled 2016-01-04: qty 2

## 2016-01-04 MED ORDER — HYDROCODONE-ACETAMINOPHEN 5-325 MG PO TABS
1.0000 | ORAL_TABLET | ORAL | Status: DC | PRN
  Administered 2016-01-04 – 2016-01-05 (×3): 2 via ORAL
  Filled 2016-01-04 (×4): qty 2

## 2016-01-04 MED ORDER — HYDROMORPHONE HCL 1 MG/ML IJ SOLN
0.2500 mg | INTRAMUSCULAR | Status: DC | PRN
  Administered 2016-01-04 (×2): 0.5 mg via INTRAVENOUS

## 2016-01-04 MED ORDER — LACTATED RINGERS IV SOLN
INTRAVENOUS | Status: DC
  Administered 2016-01-04: 13:00:00 via INTRAVENOUS

## 2016-01-04 MED ORDER — DEXAMETHASONE SODIUM PHOSPHATE 10 MG/ML IJ SOLN
INTRAMUSCULAR | Status: AC
  Filled 2016-01-04: qty 1

## 2016-01-04 MED ORDER — MIDAZOLAM HCL 5 MG/5ML IJ SOLN
INTRAMUSCULAR | Status: DC | PRN
  Administered 2016-01-04: 2 mg via INTRAVENOUS

## 2016-01-04 MED ORDER — FENTANYL CITRATE (PF) 100 MCG/2ML IJ SOLN
INTRAMUSCULAR | Status: DC | PRN
  Administered 2016-01-04: 50 ug via INTRAVENOUS
  Administered 2016-01-04 (×2): 25 ug via INTRAVENOUS

## 2016-01-04 MED ORDER — LACTATED RINGERS IV SOLN
INTRAVENOUS | Status: DC | PRN
  Administered 2016-01-04: 13:00:00 via INTRAVENOUS

## 2016-01-04 SURGICAL SUPPLY — 27 items
BLADE SURG 15 STRL LF DISP TIS (BLADE) ×1 IMPLANT
BLADE SURG 15 STRL SS (BLADE) ×2
BRIEF STRETCH FOR OB PAD LRG (UNDERPADS AND DIAPERS) ×3 IMPLANT
COVER SURGICAL LIGHT HANDLE (MISCELLANEOUS) ×3 IMPLANT
DRAIN PENROSE 18X1/2 LTX STRL (DRAIN) IMPLANT
DRSG PAD ABDOMINAL 8X10 ST (GAUZE/BANDAGES/DRESSINGS) ×3 IMPLANT
ELECT PENCIL ROCKER SW 15FT (MISCELLANEOUS) ×3 IMPLANT
ELECT REM PT RETURN 9FT ADLT (ELECTROSURGICAL) ×3
ELECTRODE REM PT RTRN 9FT ADLT (ELECTROSURGICAL) ×1 IMPLANT
GAUZE PACKING IODOFORM 1X5 (MISCELLANEOUS) ×3 IMPLANT
GAUZE PACKING IODOFORM 2 (PACKING) ×3 IMPLANT
GAUZE SPONGE 4X4 12PLY STRL (GAUZE/BANDAGES/DRESSINGS) ×3 IMPLANT
GLOVE SURG ORTHO 8.0 STRL STRW (GLOVE) ×3 IMPLANT
GOWN STRL REUS W/TWL XL LVL3 (GOWN DISPOSABLE) ×9 IMPLANT
KIT BASIN OR (CUSTOM PROCEDURE TRAY) ×3 IMPLANT
LUBRICANT JELLY K Y 4OZ (MISCELLANEOUS) ×3 IMPLANT
PACK LITHOTOMY IV (CUSTOM PROCEDURE TRAY) ×3 IMPLANT
PACKING VAGINAL (PACKING) IMPLANT
PAD ABD 8X10 STRL (GAUZE/BANDAGES/DRESSINGS) ×3 IMPLANT
SOL PREP PROV IODINE SCRUB 4OZ (MISCELLANEOUS) ×3 IMPLANT
SPONGE LAP 18X18 X RAY DECT (DISPOSABLE) ×3 IMPLANT
SUT ETHILON 2 0 PS N (SUTURE) IMPLANT
SWAB COLLECTION DEVICE MRSA (MISCELLANEOUS) IMPLANT
TOWEL OR 17X26 10 PK STRL BLUE (TOWEL DISPOSABLE) ×3 IMPLANT
TOWEL OR NON WOVEN STRL DISP B (DISPOSABLE) ×3 IMPLANT
UNDERPAD 30X30 INCONTINENT (UNDERPADS AND DIAPERS) ×3 IMPLANT
YANKAUER SUCT BULB TIP 10FT TU (MISCELLANEOUS) ×3 IMPLANT

## 2016-01-04 NOTE — Transfer of Care (Signed)
Immediate Anesthesia Transfer of Care Note  Patient: Rodney Powell  Procedure(s) Performed: Procedure(s): IRRIGATION AND DEBRIDEMENT RIGHT BUTTOCKS  ABSCESS (Right)  Patient Location: PACU  Anesthesia Type:General  Level of Consciousness:  sedated, patient cooperative and responds to stimulation  Airway & Oxygen Therapy:Patient Spontanous Breathing and Patient connected to face mask oxgen  Post-op Assessment:  Report given to PACU RN and Post -op Vital signs reviewed and stable  Post vital signs:  Reviewed and stable  Last Vitals:  Vitals:   01/03/16 2102 01/04/16 0608  BP: 128/84 123/71  Pulse: 94 88  Resp: 20 18  Temp: 36.9 C 37.2 C    Complications: No apparent anesthesia complications

## 2016-01-04 NOTE — Anesthesia Postprocedure Evaluation (Signed)
Anesthesia Post Note  Patient: Rodney Powell  Procedure(s) Performed: Procedure(s) (LRB): IRRIGATION AND DEBRIDEMENT RIGHT BUTTOCKS  ABSCESS (Right)  Patient location during evaluation: PACU Anesthesia Type: General Level of consciousness: awake Pain management: pain level controlled Respiratory status: spontaneous breathing Cardiovascular status: stable Anesthetic complications: no    Last Vitals:  Vitals:   01/04/16 1430 01/04/16 1445  BP: 136/85 127/77  Pulse: 88 94  Resp: 18 16  Temp:  37.1 C    Last Pain:  Vitals:   01/04/16 1430  TempSrc:   PainSc: Asleep                 EDWARDS,Willard Madrigal

## 2016-01-04 NOTE — Progress Notes (Signed)
Patient ID: Rodney Powell, male   DOB: 01/07/1976, 40 y.o.   MRN: [id number]    PROGRESS NOTE    Rodney Powell  BMW:[id number] DOB: 05/21/1975 DOA: 01/02/2016  PCP: No PCP Per Patient   Brief Narrative:  40 y.o. male with medical history significant of tobacco abuse. Four days ago, patient reports noticing development of a painful abscess on his right buttock. He started to develop associated subjective fevers, chills and nausea. Symptoms continued to worsen so he came for evaluation. He has not tried anything to help with pain or symptoms.   Assessment & Plan:   Right buttocks abscess - initial draining thru I&D done while in ED but still large with no improvement  - continue ABX vanc and zosyn day #3 - surgery team following, plan to take to OR today  - keep NPO, continue IVF  Hypokalemia - supplemented and WNL   Hyperglycemia - A1C pending - monitor on SSI  DVT prophylaxis: SCD's Code Status: Full  Family Communication: Patient at bedside  Disposition Plan: Home once off ABX IV  Consultants:   Surgery   Procedures:   I&D  Antimicrobials:   Vnac 9/25 -->  Zosyn 9/25 -->   Subjective: Still in pain, 8/10. Does not want pain medicine.   Objective: Vitals:   01/03/16 0755 01/03/16 1501 01/03/16 2102 01/04/16 0608  BP: 136/72 129/81 128/84 123/71  Pulse: 97 91 94 88  Resp: 18 18 20 18   Temp: 100.1 F (37.8 C) 99 F (37.2 C) 98.4 F (36.9 C) 99 F (37.2 C)  TempSrc: Oral Oral Oral Oral  SpO2: 98% 98% 100% 100%  Weight: 95.7 kg (211 lb)     Height: 5\' 8"  (1.727 m)       Intake/Output Summary (Last 24 hours) at 01/04/16 1020 Last data filed at 01/04/16 0618  Gross per 24 hour  Intake             3440 ml  Output                0 ml  Net             3440 ml   Filed Weights   01/02/16 2024 01/03/16 0755  Weight: 104.3 kg (230 lb) 95.7 kg (211 lb)    Examination:  General exam: Appears calm and comfortable  Respiratory system: Clear to  auscultation. Respiratory effort normal. Cardiovascular system: S1 & S2 heard, RRR. No JVD, murmurs, rubs, gallops or clicks. No pedal edema. Gastrointestinal system: Abdomen is nondistended, soft and nontender. No organomegaly or masses felt. Normal bowel sounds heard. Central nervous system: Alert and oriented. No focal neurological deficits.  Data Reviewed: I have personally reviewed following labs and imaging studies  CBC:  Recent Labs Lab 01/03/16 0027 01/04/16 0534  WBC 12.0* 9.0  NEUTROABS 8.3*  --   HGB 14.4 12.6*  HCT 40.1 35.4*  MCV 83.2 84.1  PLT 174 157   Basic Metabolic Panel:  Recent Labs Lab 01/03/16 0027 01/04/16 0534  NA 132* 138  K 3.3* 3.7  CL 100* 107  CO2 22 26  GLUCOSE 250* 203*  BUN 12 8  CREATININE 0.81 0.84  CALCIUM 8.6* 8.1*   CBG:  Recent Labs Lab 01/02/16 2032 01/03/16 1221 01/03/16 1619 01/03/16 2058 01/04/16 0747  GLUCAP 325* 279* 240* 262* 220*   Radiology Studies: Ct Abdomen Pelvis W Contrast  Addendum Date: 01/03/2016   ADDENDUM REPORT: 01/03/2016 21:02 ADDENDUM: Please note there is a typographical error  in the impression of the report. The correct impression should read: Focal complex collection containing pockets of gas and small amount of fluid in the subcutaneous soft tissues of the right gluteal region compatible with phlegmon/early abscess formation. Electronically Signed   By: Elgie CollardArash  Radparvar M.D.   On: 01/03/2016 21:02   Result Date: 01/03/2016 CLINICAL DATA:  40 year old male with fever and chills. Perirectal abscess. EXAM: CT ABDOMEN AND PELVIS WITH CONTRAST TECHNIQUE: Multidetector CT imaging of the abdomen and pelvis was performed using the standard protocol following bolus administration of intravenous contrast. CONTRAST:  100mL ISOVUE-300 IOPAMIDOL (ISOVUE-300) INJECTION 61% COMPARISON:  None. FINDINGS: The visualized lung bases are clear. No intra-abdominal free air or free fluid. Probable mild fatty infiltration of  the liver. The gallbladder is predominantly collapsed. No calcified gallstone. The pancreas, spleen, adrenal glands, kidneys, visualized ureters, and urinary bladder appear unremarkable. The prostate and seminal vesicles are grossly unremarkable. There is moderate stool throughout the colon. No evidence of bowel obstruction or active inflammation. Normal appendix. The abdominal aorta and IVC appear unremarkable. No portal venous gas identified. There is no adenopathy. There is a small fat containing umbilical hernia. There is a 3.8 x 3.9 cm complex collection containing pockets of gas in the subcutaneous soft tissues of the right gluteal region most compatible with phlegmon as changes or early abscess formation. There is induration and inflammation of the surrounding soft tissues. The remainder of the soft tissues appear unremarkable. There is mild degenerative changes of the spine with disc desiccation at L3-L4. 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 No listhesis. No acute fracture. IMPRESSION: Focal complex collection containing pockets of gas and small amount of fluid in the subcutaneous soft tissues of the right gluteal region compatible with headache mild/early abscess formation. No other acute intra-abdominal pelvic pathology identified. Electronically Signed: By: Elgie CollardArash  Radparvar M.D. On: 01/03/2016 02:37      Scheduled Meds: . insulin aspart  0-9 Units Subcutaneous TID WC  . piperacillin-tazobactam (ZOSYN)  IV  3.375 g Intravenous Q8H  . vancomycin  1,000 mg Intravenous Q8H   Continuous Infusions: . sodium chloride 100 mL/hr at 01/04/16 0107     LOS: 1 day    Time spent: 20 minutes    Debbora PrestoMAGICK-Balen Woolum, MD [facility] Pager [phone]  If 7PM-7AM, please contact night-coverage [email or web address] Password [email or web address] 01/04/2016, 10:20 AM

## 2016-01-04 NOTE — H&P (View-Only) (Signed)
Reason for Consult:Right buttocks abscess Referring Physician: Dr. Daleen Bo knapp  Rodney Powell is an 40 y.o. male.  HPI: Pt presented to the ED with an abscess to the right buttocks with some drainage and fever that started about 3 days ago with fever and chills for 2 days.  NO PCP for about 3 years. Work up in the ED shows pt is hypertensive, Na 132, glucose 325, WBC 12 K, CT scan shows:  There is a small fat containing umbilical hernia. There is a 3.8 x 3.9 cm complex collection containing pockets of gas in the subcutaneous soft tissues of the right gluteal region most compatible with phlegmon as changes or early abscess formation. There is induration and inflammation of the surrounding soft tissues. The remainder of the soft tissues appear unremarkable.  Pt had an I&D of the site, has been admitted by Medicine for treatment and we are ask to see.   Past Medical History:  Diagnosis Date  . Diabetes mellitus without complication ([place])  Diagnosed as prediabetic 6 years ago, never treated.    TYPE II    History reviewed. No pertinent surgical history.  History reviewed. No pertinent family history.  Social History:  reports that he has never smoked. He has never used smokeless tobacco. He reports that he does not drink alcohol or use drugs. Tobacco:  1 PPD x 22 years ETOH:  + use,  Drugs:  None  Lives with son age 37 and girlfriend Allergies: No Known Allergies  Prior to Admission medications   Not on File    Continuous: . sodium chloride 100 mL/hr at 01/03/16 0402   PRN: Anti-infectives    Start     Dose/Rate Route Frequency Ordered Stop   01/03/16 0630  piperacillin-tazobactam (ZOSYN) IVPB 3.375 g     3.375 g 12.5 mL/hr over 240 Minutes Intravenous  Once 01/03/16 0615 01/03/16 0722   01/03/16 0315  vancomycin (VANCOCIN) IVPB 1000 mg/200 mL premix     1,000 mg 200 mL/hr over 60 Minutes Intravenous  Once 01/03/16 0310 01/03/16 0603      Results for orders placed or  performed during the hospital encounter of 01/02/16 (from the past 48 hour(s))  CBG monitoring, ED     Status: Abnormal   Collection Time: 01/02/16  8:32 PM  Result Value Ref Range   Glucose-Capillary 325 (H) 65 - 99 mg/dL  Basic metabolic panel     Status: Abnormal   Collection Time: 01/03/16 12:27 AM  Result Value Ref Range   Sodium 132 (L) 135 - 145 mmol/L   Potassium 3.3 (L) 3.5 - 5.1 mmol/L   Chloride 100 (L) 101 - 111 mmol/L   CO2 22 22 - 32 mmol/L   Glucose, Bld 250 (H) 65 - 99 mg/dL   BUN 12 6 - 20 mg/dL   Creatinine, Ser 0.81 0.61 - 1.24 mg/dL   Calcium 8.6 (L) 8.9 - 10.3 mg/dL   GFR calc non Af Amer >60 >60 mL/min   GFR calc Af Amer >60 >60 mL/min    Comment: (NOTE) The eGFR has been calculated using the CKD EPI equation. This calculation has not been validated in all clinical situations. eGFR's persistently <60 mL/min signify possible Chronic Kidney Disease.    Anion gap 10 5 - 15  CBC with Differential     Status: Abnormal   Collection Time: 01/03/16 12:27 AM  Result Value Ref Range   WBC 12.0 (H) 4.0 - 10.5 K/uL   RBC 4.82 4.22 -  5.81 MIL/uL   Hemoglobin 14.4 13.0 - 17.0 g/dL   HCT 40.1 39.0 - 52.0 %   MCV 83.2 78.0 - 100.0 fL   MCH 29.9 26.0 - 34.0 pg   MCHC 35.9 30.0 - 36.0 g/dL   RDW 11.7 11.5 - 15.5 %   Platelets 174 150 - 400 K/uL   Neutrophils Relative % 70 %   Neutro Abs 8.3 (H) 1.7 - 7.7 K/uL   Lymphocytes Relative 22 %   Lymphs Abs 2.7 0.7 - 4.0 K/uL   Monocytes Relative 8 %   Monocytes Absolute 1.0 0.1 - 1.0 K/uL   Eosinophils Relative 0 %   Eosinophils Absolute 0.0 0.0 - 0.7 K/uL   Basophils Relative 0 %   Basophils Absolute 0.0 0.0 - 0.1 K/uL    Ct Abdomen Pelvis W Contrast  Result Date: 01/03/2016 CLINICAL DATA:  40 year old male with fever and chills. Perirectal abscess. EXAM: CT ABDOMEN AND PELVIS WITH CONTRAST TECHNIQUE: Multidetector CT imaging of the abdomen and pelvis was performed using the standard protocol following bolus  administration of intravenous contrast. CONTRAST:  172m ISOVUE-300 IOPAMIDOL (ISOVUE-300) INJECTION 61% COMPARISON:  None. FINDINGS: The visualized lung bases are clear. No intra-abdominal free air or free fluid. Probable mild fatty infiltration of the liver. The gallbladder is predominantly collapsed. No calcified gallstone. The pancreas, spleen, adrenal glands, kidneys, visualized ureters, and urinary bladder appear unremarkable. The prostate and seminal vesicles are grossly unremarkable. There is moderate stool throughout the colon. No evidence of bowel obstruction or active inflammation. Normal appendix. The abdominal aorta and IVC appear unremarkable. No portal venous gas identified. There is no adenopathy. There is a small fat containing umbilical hernia. There is a 3.8 x 3.9 cm complex collection containing pockets of gas in the subcutaneous soft tissues of the right gluteal region most compatible with phlegmon as changes or early abscess formation. There is induration and inflammation of the surrounding soft tissues. The remainder of the soft tissues appear unremarkable. There is mild degenerative changes of the spine with disc desiccation at L3-L4. 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 No listhesis. No acute fracture. IMPRESSION: Focal complex collection containing pockets of gas and small amount of fluid in the subcutaneous soft tissues of the right gluteal region compatible with headache mild/early abscess formation. No other acute intra-abdominal pelvic pathology identified. Electronically Signed   By: AAnner CreteM.D.   On: 01/03/2016 02:37    Review of Systems  Constitutional: Positive for chills, fever and weight loss (20 lbs over 3 months, eating better.).  HENT: Negative.   Eyes: Negative.   Respiratory: Negative.   Cardiovascular: Positive for chest pain (Intermittent, not related to activity.  Less pain with lower weight.). Negative for palpitations, orthopnea, claudication, leg  swelling and PND.  Gastrointestinal: Negative.  Negative for heartburn, nausea and vomiting.  Genitourinary: Negative.   Musculoskeletal: Negative.   Skin: Negative.        Complains of pain left thigh knee to thigh, feels like each hair has splinters going into his skin.  Just left thigh  Neurological: Negative.   Endo/Heme/Allergies: Negative.   Psychiatric/Behavioral: Negative.    Blood pressure 132/74, pulse 107, temperature 98.1 F (36.7 C), temperature source Oral, resp. rate 18, height 5' 8"  (1.727 m), weight 104.3 kg (230 lb), SpO2 97 %. Physical Exam  Constitutional: He is oriented to person, place, and time. He appears well-developed and well-nourished. No distress.  HENT:  Head: Normocephalic and atraumatic.  Eyes: Right eye  exhibits no discharge. Left eye exhibits no discharge. No scleral icterus.  Neck: Normal range of motion. Neck supple. No JVD present. No tracheal deviation present. No thyromegaly present.  Cardiovascular: Normal rate, regular rhythm, normal heart sounds and intact distal pulses.   No murmur heard. Respiratory: Effort normal and breath sounds normal. No respiratory distress. He has no wheezes. He has no rales. He exhibits no tenderness.  GI: Soft. Bowel sounds are normal. He exhibits no distension and no mass. There is no tenderness. There is no rebound and no guarding.  Genitourinary:  Genitourinary Comments: Right buttocks has a <1cm I&D site draining allot of brown malodorous fluid.  Site is indurated and a little erythematous, about 3-4 cm in diameter.    Musculoskeletal: He exhibits edema and tenderness (tender left anterior thigh). He exhibits no deformity.  Lymphadenopathy:    He has no cervical adenopathy.  Neurological: He is alert and oriented to person, place, and time. A cranial nerve deficit is present.  Skin: Skin is warm and dry. No rash noted. He is not diaphoretic. No erythema. No pallor.  Psychiatric: He has a normal mood and affect. His  behavior is normal. Judgment and thought content normal.    Assessment/Plan: Right buttocks abscess - draining thru I&D done in ED AODM with no treatment Probable diabetic neuropathy left anterior thigh Intermittent chest pain   Plan: K pad/Sitz bath today, continue antibiotics, reexamine tomorrow.  NPO after MN.  We will decide on taking to OR tomorrow after 24 hours on abx.    Rodney Powell 01/03/2016, 7:19 AM

## 2016-01-04 NOTE — Anesthesia Preprocedure Evaluation (Addendum)
Anesthesia Evaluation  Patient identified by MRN, date of birth, ID band  Reviewed: Allergy & Precautions, H&P , NPO status   Airway Mallampati: II  TM Distance: >3 FB     Dental  (+) Teeth Intact, Dental Advidsory Given   Pulmonary Current Smoker,    breath sounds clear to auscultation       Cardiovascular negative cardio ROS   Rhythm:Regular Rate:Normal     Neuro/Psych    GI/Hepatic   Endo/Other  diabetes, Type 2  Renal/GU      Musculoskeletal   Abdominal   Peds  Hematology   Anesthesia Other Findings   Reproductive/Obstetrics                           Anesthesia Physical Anesthesia Plan  ASA: II  Anesthesia Plan: General and General LMA   Post-op Pain Management:    Induction:   Airway Management Planned: LMA  Additional Equipment:   Intra-op Plan:   Post-operative Plan: Extubation in OR  Informed Consent:   Dental advisory given  Plan Discussed with: CRNA and Anesthesiologist  Anesthesia Plan Comments:        Anesthesia Quick Evaluation

## 2016-01-04 NOTE — Progress Notes (Signed)
Inpatient Diabetes Program Recommendations  AACE/ADA: New Consensus Statement on Inpatient Glycemic Control (2015)  Target Ranges:  Prepandial:   less than 140 mg/dL      Peak postprandial:   less than 180 mg/dL (1-2 hours)      Critically ill patients:  140 - 180 mg/dL   Results for Ferman HammingHANKINS, Corrin (MRN [id number]) as of 01/04/2016 10:41  Ref. Range 01/03/2016 12:21 01/03/2016 16:19 01/03/2016 20:58  Glucose-Capillary Latest Ref Range: 65 - 99 mg/dL 409279 (H) 811240 (H) 914262 (H)   Results for Ferman HammingHANKINS, Lin (MRN [id number]) as of 01/04/2016 10:41  Ref. Range 01/04/2016 07:47  Glucose-Capillary Latest Ref Range: 65 - 99 mg/dL 086220 (H)    Admit with: Abscess of R Buttock  History: "Borderline DM" per patient  Home DM Meds: None  Current Insulin Orders: Novolog Sensitive Correction Scale/ SSI (0-9 units) TID AC      -Patient NPO this AM for I&D with surgical team.  -Hemoglobin A1c still pending.  -Glucose levels still >200 mg/dl.     MD- Please consider the following in-hospital insulin adjustments to help with healing:  1. Increase Novolog Correction Scale/ SSI to Moderate scale (0-15 units) TID AC + HS  2. Start low dose basal insulin: Lantus 10 units daily (0.1 units/kg dosing)      --Will follow patient during hospitalization--  Ambrose FinlandJeannine Johnston Aylanie Cubillos RN, MSN, CDE Diabetes Coordinator Inpatient Glycemic Control Team Team Pager: [phone] (8a-5p)

## 2016-01-04 NOTE — Brief Op Note (Signed)
01/02/2016 - 01/04/2016  1:41 PM  PATIENT:  Rodney Powell  40 y.o. male  PRE-OPERATIVE DIAGNOSIS:  Right Buttocks Abcess  POST-OPERATIVE DIAGNOSIS:  Right Buttocks Abcess  PROCEDURE:  Procedure(s): IRRIGATION AND DEBRIDEMENT RIGHT BUTTOCKS  ABSCESS (Right)  SURGEON:  Surgeon(s) and Role:    * Darnell Levelodd Donnell Wion, MD - Primary  ANESTHESIA:   general  EBL:  Total I/O In: 800 [I.V.:800] Out: 5 [Blood:5]  BLOOD ADMINISTERED:none  DRAINS: none   LOCAL MEDICATIONS USED:  NONE  SPECIMEN:  No Specimen  DISPOSITION OF SPECIMEN:  N/A  COUNTS:  YES  TOURNIQUET:  * No tourniquets in log *  DICTATION: .Other Dictation: Dictation Number [id number]  PLAN OF CARE: Admit for overnight observation  PATIENT DISPOSITION:  PACU - hemodynamically stable.   Delay start of Pharmacological VTE agent (>24hrs) due to surgical blood loss or risk of bleeding: yes  Velora Hecklerodd M. Aryan Bello, MD, [facility] Office: [phone]

## 2016-01-04 NOTE — Progress Notes (Signed)
Subjective: No improvement with the Sitz bath, K pad, and antibiotics.  Still draining from the I&D site done in the ED.  I think the area is larger and drainage is white purulent material this AM.    Objective: Vital signs in last 24 hours: Temp:  [98.4 F (36.9 C)-99 F (37.2 C)] 99 F (37.2 C) (09/27 0608) Pulse Rate:  [88-94] 88 (09/27 0608) Resp:  [18-20] 18 (09/27 0608) BP: (123-129)/(71-84) 123/71 (09/27 0608) SpO2:  [98 %-100 %] 100 % (09/27 0608)  240 PO 3250 IV fluids Voided x 4 recorded Afebrile, after 7 AM, VSS WBC is better, Glucose is 203 Intake/Output from previous day: 09/26 0701 - 09/27 0700 In: 3490 [P.O.:240; I.V.:2200; IV Piggyback:1050] Out: -  Intake/Output this shift: No intake/output data recorded.  General appearance: alert, cooperative and mild distress Skin: Right buttocks, 10 x 6 cm area indurated, fluctuant, tetnder/painful, draining white purulent material.     Lab Results:   Recent Labs  01/03/16 0027 01/04/16 0534  WBC 12.0* 9.0  HGB 14.4 12.6*  HCT 40.1 35.4*  PLT 174 157    BMET  Recent Labs  01/03/16 0027 01/04/16 0534  NA 132* 138  K 3.3* 3.7  CL 100* 107  CO2 22 26  GLUCOSE 250* 203*  BUN 12 8  CREATININE 0.81 0.84  CALCIUM 8.6* 8.1*   PT/INR No results for input(s): LABPROT, INR in the last 72 hours.  No results for input(s): AST, ALT, ALKPHOS, BILITOT, PROT, ALBUMIN in the last 168 hours.   Lipase  No results found for: LIPASE   Studies/Results: Ct Abdomen Pelvis W Contrast  Addendum Date: 01/03/2016   ADDENDUM REPORT: 01/03/2016 21:02 ADDENDUM: Please note there is a typographical error in the impression of the report. The correct impression should read: Focal complex collection containing pockets of gas and small amount of fluid in the subcutaneous soft tissues of the right gluteal region compatible with phlegmon/early abscess formation. Electronically Signed   By: Elgie Collard M.D.   On: 01/03/2016  21:02   Result Date: 01/03/2016 CLINICAL DATA:  40 year old male with fever and chills. Perirectal abscess. EXAM: CT ABDOMEN AND PELVIS WITH CONTRAST TECHNIQUE: Multidetector CT imaging of the abdomen and pelvis was performed using the standard protocol following bolus administration of intravenous contrast. CONTRAST:  ISOVUE-300 IOPAMIDOL (ISOVUE-300) INJECTION 61% COMPARISON:  None. FINDINGS: The visualized lung bases are clear. No intra-abdominal free air or free fluid. Probable mild fatty infiltration of the liver. The gallbladder is predominantly collapsed. No calcified gallstone. The pancreas, spleen, adrenal glands, kidneys, visualized ureters, and urinary bladder appear unremarkable. The prostate and seminal vesicles are grossly unremarkable. There is moderate stool throughout the colon. No evidence of bowel obstruction or active inflammation. Normal appendix. The abdominal aorta and IVC appear unremarkable. No portal venous gas identified. There is no adenopathy. There is a small fat containing umbilical hernia. There is a 3.8 x 3.9 cm complex collection containing pockets of gas in the subcutaneous soft tissues of the right gluteal region most compatible with phlegmon as changes or early abscess formation. There is induration and inflammation of the surrounding soft tissues. The remainder of the soft tissues appear unremarkable. There is mild degenerative changes of the spine with disc desiccation at L3-L4. 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 No listhesis. No acute fracture. IMPRESSION: Focal complex collection containing pockets of gas and small amount of fluid in the subcutaneous soft tissues of the right gluteal region compatible with headache  mild/early abscess formation. No other acute intra-abdominal pelvic pathology identified. Electronically Signed: By: Elgie CollardArash  Radparvar M.D. On: 01/03/2016 02:37    Medications: . insulin aspart  0-9 Units Subcutaneous TID WC  .  piperacillin-tazobactam (ZOSYN)  IV  3.375 g Intravenous Q8H  . vancomycin  1,000 mg Intravenous Q8H   . sodium chloride 100 mL/hr at 01/04/16 0107    Assessment/Plan Right buttocks abscess - draining thru I&D done in ED - area looks larger today. AODM with no treatment Probable diabetic neuropathy left anterior thigh Intermittent chest pain FEN:  IV fluids/NPO ID:  Day 3 Vancomycin/Zosyn - no cultures  DVT: SCD's ordered   Plan:  I have posted for I&D of right buttocks abscess.  Pt is agreeable to this, risk and benefits discussed.          LOS: 1 day    Chatham Howington 01/04/2016 [phone]

## 2016-01-04 NOTE — Anesthesia Procedure Notes (Signed)
Procedure Name: LMA Insertion Date/Time: 01/04/2016 1:11 PM Performed by: Jhonnie GarnerMARSHALL, Keelyn Monjaras M Pre-anesthesia Checklist: Patient identified, Emergency Drugs available, Suction available and Patient being monitored Patient Re-evaluated:Patient Re-evaluated prior to inductionOxygen Delivery Method: Circle system utilized Preoxygenation: Pre-oxygenation with 100% oxygen Intubation Type: IV induction Ventilation: Mask ventilation without difficulty LMA: LMA inserted LMA Size: 5.0 Number of attempts: 1 Placement Confirmation: positive ETCO2 and breath sounds checked- equal and bilateral Tube secured with: Tape Dental Injury: Teeth and Oropharynx as per pre-operative assessment

## 2016-01-04 NOTE — Interval H&P Note (Signed)
History and Physical Interval Note:  01/04/2016 12:33 PM  Rodney Powell  has presented today for surgery, with the diagnosis of Right Buttocks Abcess.  The various methods of treatment have been discussed with the patient and family. After consideration of risks, benefits and other options for treatment, the patient has consented to    Procedure(s): IRRIGATION AND DEBRIDEMENT RIGHT BUTTOCKS  ABSCESS (Right) as a surgical intervention .    The patient's history has been reviewed, patient examined, no change in status, stable for surgery.  I have reviewed the patient's chart and labs.  Questions were answered to the patient's satisfaction.    Velora Hecklerodd M. Latavius Capizzi, MD, [facility] Office: [phone]    Antonia Jicha MJudie Petit

## 2016-01-05 ENCOUNTER — Encounter (HOSPITAL_COMMUNITY): Payer: Self-pay | Admitting: Surgery

## 2016-01-05 LAB — BASIC METABOLIC PANEL
ANION GAP: 6 (ref 5–15)
BUN: 6 mg/dL (ref 6–20)
CALCIUM: 8.3 mg/dL — AB (ref 8.9–10.3)
CO2: 27 mmol/L (ref 22–32)
CREATININE: 0.8 mg/dL (ref 0.61–1.24)
Chloride: 106 mmol/L (ref 101–111)
Glucose, Bld: 133 mg/dL — ABNORMAL HIGH (ref 65–99)
Potassium: 3.9 mmol/L (ref 3.5–5.1)
SODIUM: 139 mmol/L (ref 135–145)

## 2016-01-05 LAB — CBC
HEMATOCRIT: 36 % — AB (ref 39.0–52.0)
Hemoglobin: 12.9 g/dL — ABNORMAL LOW (ref 13.0–17.0)
MCH: 29.3 pg (ref 26.0–34.0)
MCHC: 35.8 g/dL (ref 30.0–36.0)
MCV: 81.6 fL (ref 78.0–100.0)
PLATELETS: 179 10*3/uL (ref 150–400)
RBC: 4.41 MIL/uL (ref 4.22–5.81)
RDW: 11.8 % (ref 11.5–15.5)
WBC: 8.6 10*3/uL (ref 4.0–10.5)

## 2016-01-05 LAB — GLUCOSE, CAPILLARY
GLUCOSE-CAPILLARY: 121 mg/dL — AB (ref 65–99)
Glucose-Capillary: 185 mg/dL — ABNORMAL HIGH (ref 65–99)

## 2016-01-05 LAB — VANCOMYCIN, TROUGH: VANCOMYCIN TR: 13 ug/mL — AB (ref 15–20)

## 2016-01-05 MED ORDER — METFORMIN HCL 500 MG PO TABS: 500.0000 mg | ORAL_TABLET | Freq: Two times a day (BID) | ORAL | 1 refills | Status: DC

## 2016-01-05 MED ORDER — VANCOMYCIN HCL 10 G IV SOLR
1250.0000 mg | Freq: Three times a day (TID) | INTRAVENOUS | Status: DC
  Administered 2016-01-05: 1250 mg via INTRAVENOUS
  Filled 2016-01-05 (×3): qty 1250

## 2016-01-05 MED ORDER — LIVING WELL WITH DIABETES BOOK
Status: AC
  Administered 2016-01-05: 14:00:00
  Filled 2016-01-05: qty 1

## 2016-01-05 MED ORDER — AMOXICILLIN-POT CLAVULANATE 875-125 MG PO TABS: 1.0000 | ORAL_TABLET | Freq: Two times a day (BID) | ORAL | 0 refills | Status: DC

## 2016-01-05 MED ORDER — HYDROCODONE-ACETAMINOPHEN 5-325 MG PO TABS: 1.0000 | ORAL_TABLET | ORAL | 0 refills | Status: DC | PRN

## 2016-01-05 MED ORDER — BLOOD GLUCOSE MONITOR KIT: PACK | 0 refills | Status: DC

## 2016-01-05 NOTE — Progress Notes (Signed)
Pharmacy Antibiotic Note  Rodney Powell is a 40 y.o. male admitted on 01/02/2016 with painful abscess of right buttocks.  Pharmacy has been consulted for Vancomycin dosing.  Plan: Change vancomycin to 1250mg  IV every 8 hours.  Goal trough 15-20 mcg/mL.  Height: 5\' 8"  (172.7 cm) Weight: 211 lb (95.7 kg) IBW/kg (Calculated) : 68.4  Temp (24hrs), Avg:98.5 F (36.9 C), Min:98 F (36.7 C), Max:98.8 F (37.1 C)   Recent Labs Lab 01/03/16 0027 01/04/16 0534 01/05/16 0535  WBC 12.0* 9.0 8.6  CREATININE 0.81 0.84 0.80  VANCOTROUGH  --   --  13*    Estimated Creatinine Clearance: 136.3 mL/min (by C-G formula based on SCr of 0.8 mg/dL).    No Known Allergies  Antimicrobials this admission: 9/26 Zosyn>>  9/26 Vancomycin >>   Dose adjustments this admission: 9/28 0530 VT = ___13__ on vanc 1g q8h--prior doses charted correctly   Microbiology results: 9/26 BCx: sent 9/26 UCx: sent  Thank you for allowing pharmacy to be a part of this patient's care.  Aleene DavidsonGrimsley Jr, Lumina Gitto Crowford 01/05/2016 6:10 AM

## 2016-01-05 NOTE — Progress Notes (Signed)
Patient ID: Rodney Powell, male   DOB: 1976/03/28, 40 y.o.   MRN: [id number]  [facility] Surgery Progress Note  1 Day Post-Op  Subjective: Doing well. Pain decreased from yesterday.  Objective: Vital signs in last 24 hours: Temp:  [98 F (36.7 C)-98.8 F (37.1 C)] 98.3 F (36.8 C) (09/28 0848) Pulse Rate:  [66-95] 80 (09/28 0848) Resp:  [16-22] 16 (09/28 0458) BP: (120-139)/(76-99) 139/98 (09/28 0848) SpO2:  [93 %-100 %] 100 % (09/28 0848) Last BM Date: 01/04/16  Intake/Output from previous day: 09/27 0701 - 09/28 0700 In: 3713.3 [I.V.:3363.3; IV Piggyback:350] Out: 5 [Blood:5] Intake/Output this shift: No intake/output data recorded.  PE: Gen:  Alert, NAD, pleasant Right buttock wound with no drainage -- packing removed  Lab Results:   Recent Labs  01/04/16 0534 01/05/16 0535  WBC 9.0 8.6  HGB 12.6* 12.9*  HCT 35.4* 36.0*  PLT 157 179   BMET  Recent Labs  01/04/16 0534 01/05/16 0535  NA 138 139  K 3.7 3.9  CL 107 106  CO2 26 27  GLUCOSE 203* 133*  BUN 8 6  CREATININE 0.84 0.80  CALCIUM 8.1* 8.3*   PT/INR No results for input(s): LABPROT, INR in the last 72 hours. CMP     Component Value Date/Time   NA 139 01/05/2016 0535   K 3.9 01/05/2016 0535   CL 106 01/05/2016 0535   CO2 27 01/05/2016 0535   GLUCOSE 133 (H) 01/05/2016 0535   BUN 6 01/05/2016 0535   CREATININE 0.80 01/05/2016 0535   CALCIUM 8.3 (L) 01/05/2016 0535   GFRNONAA >60 01/05/2016 0535   GFRAA >60 01/05/2016 0535   Lipase  No results found for: LIPASE     Studies/Results: No results found.  Anti-infectives: Anti-infectives    Start     Dose/Rate Route Frequency Ordered Stop   01/05/16 0615  vancomycin (VANCOCIN) 1,250 mg in sodium chloride 0.9 % 250 mL IVPB     1,250 mg 166.7 mL/hr over 90 Minutes Intravenous Every 8 hours 01/05/16 0609     01/03/16 2200  vancomycin (VANCOCIN) IVPB 1000 mg/200 mL premix  Status:  Discontinued     1,000 mg 200 mL/hr over 60  Minutes Intravenous Every 8 hours 01/03/16 1435 01/05/16 0608   01/03/16 1600  piperacillin-tazobactam (ZOSYN) IVPB 3.375 g     3.375 g 12.5 mL/hr over 240 Minutes Intravenous Every 8 hours 01/03/16 1533     01/03/16 1300  vancomycin (VANCOCIN) 2,000 mg in sodium chloride 0.9 % 500 mL IVPB     2,000 mg 250 mL/hr over 120 Minutes Intravenous STAT 01/03/16 1258 01/03/16 1554   01/03/16 0630  piperacillin-tazobactam (ZOSYN) IVPB 3.375 g     3.375 g 12.5 mL/hr over 240 Minutes Intravenous  Once 01/03/16 0615 01/03/16 0722   01/03/16 0315  vancomycin (VANCOCIN) IVPB 1000 mg/200 mL premix     1,000 mg 200 mL/hr over 60 Minutes Intravenous  Once 01/03/16 0310 01/03/16 0603       Assessment/Plan S/p IRRIGATION AND DEBRIDEMENT RIGHT BUTTOCKS  ABSCESS (right) 01/04/16 Dr. Gerrit Friends - POD 1 - packing removed. Patient to perform BID sitz baths. After bath loosely pack saline soaked 4x4 gauze and apply ABD pad.  ID - vanc 9/26>>, zosyn 9/26>> FEN - regular VTE - SCD's  Plan - Continue IV antibiotics while admitted. BID sitz baths, after bath loosely pack saline soaked 4x4 gauze and apply ABD pad. Recommend PO augmentin x1 week upon discharge. Patient will follow-up with Dr.  Gerkin in about 2 weeks OP.   LOS: 2 days    Edson SnowballBROOKE A MILLER , [facility] HospitalA-C [facility] 01/05/2016, 9:27 AM Pager: [phone] Consults: [phone] Mon-Fri 7:00 am-4:30 pm Sat-Sun 7:00 am-11:30 am

## 2016-01-05 NOTE — Op Note (Signed)
NAMTheressa Stamps:  Amara, Wyman              ACCOUNT NO.:  [id number]  MEDICAL RECORD NO.:  [id number]  LOCATION:  1413                         FACILITY[facility]  [facility]  PHYSICIAN:  Velora Hecklerodd M Zalan Shidler, MD      DATE OF BIRTH:  [birth year]  DATE OF PROCEDURE:  01/04/2016                              OPERATIVE REPORT   PREOPERATIVE DIAGNOSIS:  Right buttock abscess.  POSTOPERATIVE DIAGNOSIS:  Right buttock abscess.  PROCEDURE:  Incision and drainage and open packing of right buttock abscess.  SURGEON:  Velora Hecklerodd M Whitleigh Garramone, MD  ANESTHESIA:  General.  ESTIMATED BLOOD LOSS:  Minimal.  PREPARATION:  Betadine.  COMPLICATIONS:  None.  INDICATIONS:  The patient is a 40 year old male admitted to the Medical Service with right buttock abscess.  The patient has type 2 diabetes. He was treated with intravenous antibiotics.  General Surgery was consulted.  The patient failed to improve over 24 hours and was therefore prepared and brought to the operating room for debridement.  BODY OF REPORT:  [facility]  The patient was brought to the operating room, placed in a supine position on the operating room table.  Following administration of general anesthesia, the patient was positioned in lithotomy and prepped and draped in the usual aseptic fashion.  After ascertaining that an adequate level of anesthesia had been achieved, the fistulous wound in the mid right buttock was explored with a Kelly clamp.  An elliptical incision was made with the electrocautery to excise the fistulous tract.  Approximately a 3 x 2 cm ellipse of skin was completely excised and discarded.  The abscess cavity measures approximately 5 cm in greatest dimension.  Loculations were broken down with blunt dissection.  Cavity was irrigated copiously with warm saline. Hemostasis was achieved with the electrocautery.  Cavity was then packed with 1-inch iodoform gauze packing.  Dry gauze dressings  and ABD pad were placed.  The patient was taken out of lithotomy and awakened from anesthesia.  The patient was transported to the recovery room in stable condition.  The patient tolerated the procedure well.   Velora Hecklerodd M. Raylinn Kosar, MD, [facility] Office: [phone]   TMG/MEDQ  D:  01/04/2016  T:  01/05/2016  Job:  [id number]

## 2016-01-05 NOTE — Discharge Summary (Signed)
Physician Discharge Summary  Rodney Powell AXK:[id number] DOB: [birth year] DOA: 01/02/2016  PCP: No PCP Per Patient  Admit date: 01/02/2016 Discharge date: 01/05/2016  Recommendations for Outpatient Follow-up:  1. Pt will need to follow up with PCP in 2 weeks post discharge 2. Please obtain BMP to evaluate electrolytes and kidney function 3. Please also check CBC to evaluate Hg and Hct levels 4. Continue Augmentin for 7 more days post discharge   Discharge Diagnoses:  Principal Problem:   Abscess of right buttock Active Problems:   Elevated blood sugar   History of chest pain  Discharge Condition: Stable  Diet recommendation: Heart healthy diet discussed in details   Brief Narrative:  40 y.o.malewith medical history significant of tobacco abuse. Four days ago, patient reports noticing development of a painful abscess on his right buttock. He started to develop associated subjective fevers, chills and nausea. Symptoms continued to worsen so he came for evaluation. He has not tried anything to help with pain or symptoms.   Assessment & Plan:   Right buttocks abscess - initial draining thru I&D done while in ED but still large with no improvement  - continued ABX vanc and zosyn since admission, pt underwent I&D, tolerated this well - cleared for discharge from surgery standpoint, will change ABX to Augmentin to complete therapy   Hypokalemia - supplemented   New diabetes mellitus  - A1C 11, discussed with pt at length and he is rather reluctant to start insulin right away - I started him on metformin and advised dietary changes and pt was agreeable to this plan with quick follow up with PCP for further recommendations - I have also provided script for glucose monitor and advised pt to have blood sugar levels checked regularly and to report any CBG > 300 to his PCP as initiation on Insulin may be needed   DVT prophylaxis: SCD's Code Status: Full  Family Communication:  Patient at bedside  Disposition Plan: Home   Consultants:   Surgery   Procedures:   I&D  Antimicrobials:   Vnac 9/25 -->  Zosyn 9/25 -->  Procedures/Studies: Ct Abdomen Pelvis W Contrast  Addendum Date: 01/03/2016   ADDENDUM REPORT: 01/03/2016 21:02 ADDENDUM: Please note there is a typographical error in the impression of the report. The correct impression should read: Focal complex collection containing pockets of gas and small amount of fluid in the subcutaneous soft tissues of the right gluteal region compatible with phlegmon/early abscess formation. Electronically Signed   By: Anner Crete M.D.   On: 01/03/2016 21:02   Result Date: 01/03/2016 CLINICAL DATA:  40 year old male with fever and chills. Perirectal abscess. EXAM: CT ABDOMEN AND PELVIS WITH CONTRAST TECHNIQUE: Multidetector CT imaging of the abdomen and pelvis was performed using the standard protocol following bolus administration of intravenous contrast. CONTRAST:  180m ISOVUE-300 IOPAMIDOL (ISOVUE-300) INJECTION 61% COMPARISON:  None. FINDINGS: The visualized lung bases are clear. No intra-abdominal free air or free fluid. Probable mild fatty infiltration of the liver. The gallbladder is predominantly collapsed. No calcified gallstone. The pancreas, spleen, adrenal glands, kidneys, visualized ureters, and urinary bladder appear unremarkable. The prostate and seminal vesicles are grossly unremarkable. There is moderate stool throughout the colon. No evidence of bowel obstruction or active inflammation. Normal appendix. The abdominal aorta and IVC appear unremarkable. No portal venous gas identified. There is no adenopathy. There is a small fat containing umbilical hernia. There is a 3.8 x 3.9 cm complex collection containing pockets of gas in the subcutaneous soft  tissues of the right gluteal region most compatible with phlegmon as changes or early abscess formation. There is induration and inflammation of the  surrounding soft tissues. The remainder of the soft tissues appear unremarkable. There is mild degenerative changes of the spine with disc desiccation at L3-L4. 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 No listhesis. No acute fracture. IMPRESSION: Focal complex collection containing pockets of gas and small amount of fluid in the subcutaneous soft tissues of the right gluteal region compatible with headache mild/early abscess formation. No other acute intra-abdominal pelvic pathology identified. Electronically Signed: By: Anner Crete M.D. On: 01/03/2016 02:37      Discharge Exam: Vitals:   01/05/16 0458 01/05/16 0848  BP: 136/83 (!) 139/98  Pulse: 66 80  Resp: 16   Temp: 98.6 F (37 C) 98.3 F (36.8 C)   Vitals:   01/04/16 2133 01/04/16 2135 01/05/16 0458 01/05/16 0848  BP: (!) 124/99 120/76 136/83 (!) 139/98  Pulse: 95 80 66 80  Resp: 16  16   Temp: 98.6 F (37 C)  98.6 F (37 C) 98.3 F (36.8 C)  TempSrc: Oral  Oral Oral  SpO2: 100%  100% 100%  Weight:      Height:        General: Pt is alert, follows commands appropriately, not in acute distress Cardiovascular: Regular rate and rhythm, S1/S2 +, no rubs, no gallops Respiratory: Clear to auscultation bilaterally, no wheezing, no crackles, no rhonchi Abdominal: Soft, non tender, non distended, bowel sounds +, no guarding   Discharge Instructions  Discharge Instructions    Diet - low sodium heart healthy    Complete by:  As directed    Increase activity slowly    Complete by:  As directed        Medication List    TAKE these medications   amoxicillin-clavulanate 875-125 MG tablet Commonly known as:  AUGMENTIN Take 1 tablet by mouth 2 (two) times daily.   blood glucose meter kit and supplies Kit Dispense based on patient and insurance preference. Use up to four times daily as directed. (FOR ICD-9 250.00, 250.01).   HYDROcodone-acetaminophen 5-325 MG tablet Commonly known as:  NORCO/VICODIN Take 1-2  tablets by mouth every 4 (four) hours as needed for moderate pain.   metFORMIN 500 MG tablet Commonly known as:  GLUCOPHAGE Take 1 tablet (500 mg total) by mouth 2 (two) times daily with a meal.      Follow-up Information    Rodney Regal, MD. Go today.   Specialty:  General Surgery Why:  Your appointment is 01/20/2016 at 10:15am. Please arrive 30 minutes prior to appointment to fill out necessary paperwork. Contact information: [address] [place] [address] [phone]        Faye Ramsay, MD .   Specialty:  Internal Medicine Why:  call my cell [phone] Contact information: [address] [place] [address] [phone]            The results of significant diagnostics from this hospitalization (including imaging, microbiology, ancillary and laboratory) are listed below for reference.     Microbiology: Recent Results (from the past 240 hour(s))  Culture, blood (routine x 2)     Status: None (Preliminary result)   Collection Time: 01/03/16 12:25 AM  Result Value Ref Range Status   Specimen Description BLOOD LEFT ARM  Final   Special Requests BOTTLES DRAWN AEROBIC AND ANAEROBIC  10CC  Final   Culture  Final    NO GROWTH < 24 HOURS Performed at [facility]    Report Status PENDING  Incomplete  Culture, blood (routine x 2)     Status: None (Preliminary result)   Collection Time: 01/03/16 12:34 PM  Result Value Ref Range Status   Specimen Description BLOOD RIGHT ARM  Final   Special Requests BOTTLES DRAWN AEROBIC AND ANAEROBIC 10 CC EA  Final   Culture   Final    NO GROWTH < 24 HOURS Performed at [facility]    Report Status PENDING  Incomplete  Surgical PCR screen     Status: None   Collection Time: 01/04/16 10:48 AM  Result Value Ref Range Status   MRSA, PCR NEGATIVE NEGATIVE Final   Staphylococcus aureus NEGATIVE NEGATIVE Final    Comment:        The Xpert SA Assay (FDA approved for  NASAL specimens in patients over 40 years of age), is one component of a comprehensive surveillance program.  Test performance has been validated by [facility] for patients greater than or equal to 39 year old. It is not intended to diagnose infection nor to guide or monitor treatment.      Labs: Basic Metabolic Panel:  Recent Labs Lab 01/03/16 0027 01/04/16 0534 01/05/16 0535  NA 132* 138 139  K 3.3* 3.7 3.9  CL 100* 107 106  CO2 _0 GLUCOSE 250* 203* 133*  BUN _1 CREATININE 0.81 0.84 0.80  CALCIUM 8.6* 8.1* 8.3*   CBC:  Recent Labs Lab 01/03/16 0027 01/04/16 0534 01/05/16 0535  WBC 12.0* 9.0 8.6  NEUTROABS 8.3*  --   --   HGB 14.4 12.6* 12.9*  HCT 40.1 35.4* 36.0*  MCV 83.2 84.1 81.6  PLT 174 157 179   CBG:  Recent Labs Lab 01/04/16 1141 01/04/16 1352 01/04/16 1711 01/04/16 2158 01/05/16 0736  GLUCAP 166* 166* 148* 155* 121*   SIGNED: Time coordinating discharge:  30 minutes  MAGICK-Sharie Amorin, MD  [facility] 01/05/2016, 10:12 AM Pager [phone]  If 7PM-7AM, please contact night-coverage [email or web address] Password [email or web address]

## 2016-01-05 NOTE — Discharge Instructions (Addendum)
Take a sitz bath twice daily. After bath loosely pack saline soaked 4x4 gauze and apply ABD pad.    How to Take a Sitz Bath A sitz bath is a warm water bath that is taken while you are sitting down. The water should only come up to your hips and should cover your buttocks. Your health care provider may recommend a sitz bath to help you:   Clean the lower part of your body, including your genital area.  With itching.  With pain.  With sore muscles or muscles that tighten or spasm. HOW TO TAKE A SITZ BATH Take 3-4 sitz baths per day or as told by your health care provider. 1. Partially fill a bathtub with warm water. You will only need the water to be deep enough to cover your hips and buttocks when you are sitting in it. 2. If your health care provider told you to put medicine in the water, follow the directions exactly. 3. Sit in the water and open the tub drain a little. 4. Turn on the warm water again to keep the tub at the correct level. Keep the water running constantly. 5. Soak in the water for 15-20 minutes or as told by your health care provider. 6. After the sitz bath, pat the affected area dry first. Do not rub it. 7. Be careful when you stand up after the sitz bath because you may feel dizzy. SEEK MEDICAL CARE IF:  Your symptoms get worse. Do not continue with sitz baths if your symptoms get worse.  You have new symptoms. Do not continue with sitz baths until you talk with your health care provider.   This information is not intended to replace advice given to you by your health care provider. Make sure you discuss any questions you have with your health care provider.   Document Released: 12/17/2003 Document Revised: 08/10/2014 Document Reviewed: 03/24/2014 Elsevier Interactive Patient Education 2016 Elsevier Inc.   Blood Glucose Monitoring, Adult Monitoring your blood glucose (also know as blood sugar) helps you to manage your diabetes. It also helps you and your  health care provider monitor your diabetes and determine how well your treatment plan is working. WHY SHOULD YOU MONITOR YOUR BLOOD GLUCOSE?  It can help you understand how food, exercise, and medicine affect your blood glucose.  It allows you to know what your blood glucose is at any given moment. You can quickly tell if you are having low blood glucose (hypoglycemia) or high blood glucose (hyperglycemia).  It can help you and your health care provider know how to adjust your medicines.  It can help you understand how to manage an illness or adjust medicine for exercise. WHEN SHOULD YOU TEST? Your health care provider will help you decide how often you should check your blood glucose. This may depend on the type of diabetes you have, your diabetes control, or the types of medicines you are taking. Be sure to write down all of your blood glucose readings so that this information can be reviewed with your health care provider. See below for examples of testing times that your health care provider may suggest. Type 1 Diabetes  Test at least 2 times per day if your diabetes is well controlled, if you are using an insulin pump, or if you perform multiple daily injections.  If your diabetes is not well controlled or if you are sick, you may need to test more often.  It is a good idea to also test:  Before every insulin injection.  Before and after exercise.  Between meals and 2 hours after a meal.  Occasionally between 2:00 a.m. and 3:00 a.m. Type 2 Diabetes  If you are taking insulin, test at least 2 times per day. However, it is best to test before every insulin injection.  If you take medicines by mouth (orally), test 2 times a day.  If you are on a controlled diet, test once a day.  If your diabetes is not well controlled or if you are sick, you may need to monitor more often. HOW TO MONITOR YOUR BLOOD GLUCOSE Supplies Needed  Blood glucose meter.  Test strips for your meter.  Each meter has its own strips. You must use the strips that go with your own meter.  A pricking needle (lancet).  A device that holds the lancet (lancing device).  A journal or log book to write down your results. Procedure  Wash your hands with soap and water. Alcohol is not preferred.  Prick the side of your finger (not the tip) with the lancet.  Gently milk the finger until a small drop of blood appears.  Follow the instructions that come with your meter for inserting the test strip, applying blood to the strip, and using your blood glucose meter. Other Areas to Get Blood for Testing Some meters allow you to use other areas of your body (other than your finger) to test your blood. These areas are called alternative sites. The most common alternative sites are:  The forearm.  The thigh.  The back area of the lower leg.  The palm of the hand. The blood flow in these areas is slower. Therefore, the blood glucose values you get may be delayed, and the numbers are different from what you would get from your fingers. Do not use alternative sites if you think you are having hypoglycemia. Your reading will not be accurate. Always use a finger if you are having hypoglycemia. Also, if you cannot feel your lows (hypoglycemia unawareness), always use your fingers for your blood glucose checks. ADDITIONAL TIPS FOR GLUCOSE MONITORING  Do not reuse lancets.  Always carry your supplies with you.  All blood glucose meters have a 24-hour "hotline" number to call if you have questions or need help.  Adjust (calibrate) your blood glucose meter with a control solution after finishing a few boxes of strips. BLOOD GLUCOSE RECORD KEEPING It is a good idea to keep a daily record or log of your blood glucose readings. Most glucose meters, if not all, keep your glucose records stored in the meter. Some meters come with the ability to download your records to your home computer. Keeping a record of your  blood glucose readings is especially helpful if you are wanting to look for patterns. Make notes to go along with the blood glucose readings because you might forget what happened at that exact time. Keeping good records helps you and your health care provider to work together to achieve good diabetes management.    This information is not intended to replace advice given to you by your health care provider. Make sure you discuss any questions you have with your health care provider.   Document Released: 03/29/2003 Document Revised: 04/16/2014 Document Reviewed: 08/18/2012 Elsevier Interactive Patient Education [employer]

## 2016-01-05 NOTE — Care Management Note (Signed)
Case Management Note  Patient Details  Name: Ferman HammingDwayne Popp MRN: [id number] Date of Birth: 06/26/1975  Subjective/Objective:Called [facility] for [facility]cp appt-they have no appts available. Patient has pcp listing & can make own appts. Patient voiced understanding.Patient works(unable to qualify for med asst) & meds are on [employer] [facility]-he can afford.                    Action/Plan:d/c plan home.   Expected Discharge Date:   (unknown)               Expected Discharge Plan:  Home/Self Care  In-House Referral:     Discharge planning Services  CM Consult, Indigent Health Clinic  Post Acute Care Choice:    Choice offered to:     DME Arranged:    DME Agency:     HH Arranged:    HH Agency:     Status of Service:  Completed, signed off  If discussed at [employer] Length of Stay Meetings, dates discussed:    Additional Comments:  Lanier ClamMahabir, Jw Covin, RN 01/05/2016, 10:21 AM

## 2016-01-05 NOTE — Progress Notes (Addendum)
Inpatient Diabetes Program Recommendations  AACE/ADA: New Consensus Statement on Inpatient Glycemic Control (2015)  Target Ranges:  Prepandial:   less than 140 mg/dL      Peak postprandial:   less than 180 mg/dL (1-2 hours)      Critically ill patients:  140 - 180 mg/dL   Lab Results  Component Value Date   GLUCAP 185 (H) 01/05/2016   HGBA1C 11.0 (H) 01/03/2016    Review of Glycemic Control  Spoke with patient as a follow up from 9/26. New diagnosis of DM with A1c 11%. Informed patient on A1c level. Spoke with patient about diet and exercise. Patient is familiar with diabetes and how to check his glucose due to family history. Spoke with patient about the affordable [employer] meter and strips. Discussed with the patient to ask CM about where to get his medications either [facility] or [facility]. Discussed with patient about following up with a PCP. Discussed how and when to check blood sugars. Patient reports MD telling him he will be discharged on 2 oral medications. I spoke with him about metformin and some of the GI side effects. Spoke with him about taking his medications with food. Patient does not have any questions at this time related to DM.  Thanks,  Christena DeemShannon Mckinlee Dunk RN, MSN, [facility] Inpatient Diabetes Coordinator Team Pager [phone] (8a-5p)

## 2016-01-08 LAB — CULTURE, BLOOD (ROUTINE X 2)
Culture: NO GROWTH
Culture: NO GROWTH

## 2016-01-09 MED FILL — AMOX-CLAV 875-125 MG TABLET: 875-125 | 7 days supply | Qty: 14 | Fill #0

## 2016-01-09 MED FILL — TRUE METRIX TEST STRIP: 25 days supply | Qty: 100 | Fill #0

## 2016-01-09 MED FILL — !TRUE METRIX BLOOD GLUCOSE: 1 days supply | Qty: 1 | Fill #0

## 2016-01-09 MED FILL — TRUEplus LANCETS 28G MISC: 25 days supply | Qty: 100 | Fill #0

## 2016-01-09 MED FILL — metFORMIN HCL 500 MG TABS: 500 | 30 days supply | Qty: 60 | Fill #0

## 2019-01-19 ENCOUNTER — Ambulatory Visit (HOSPITAL_COMMUNITY)
Admission: EM | Admit: 2019-01-19 | Discharge: 2019-01-19 | Disposition: A | Discharge status: Home / Self Care | Payer: Self-pay | Attending: Family Medicine | Admitting: Family Medicine

## 2019-01-19 ENCOUNTER — Other Ambulatory Visit: Payer: Self-pay

## 2019-01-19 ENCOUNTER — Encounter (HOSPITAL_COMMUNITY): Payer: Self-pay

## 2019-01-19 DIAGNOSIS — B349 Viral infection, unspecified: Secondary | ICD-10-CM | POA: Insufficient documentation

## 2019-01-19 DIAGNOSIS — Z20828 Contact with and (suspected) exposure to other viral communicable diseases: Secondary | ICD-10-CM | POA: Insufficient documentation

## 2019-01-19 DIAGNOSIS — Z20822 Contact with and (suspected) exposure to covid-19: Secondary | ICD-10-CM

## 2019-01-19 LAB — POCT RAPID STREP A: Streptococcus, Group A Screen (Direct): NEGATIVE

## 2019-01-19 NOTE — Discharge Instructions (Signed)
May take Tylenol for pain and fever Drink plenty of fluids Quarantine until the COVID test is available     Person Under Monitoring Name: Rodney Powell  Location[place] [address] [place] [id number]   Infection Prevention Recommendations for Individuals Confirmed to have, or Being Evaluated for, 2019 Novel Coronavirus (COVID-19) Infection Who Receive Care at Home  Individuals who are confirmed to have, or are being evaluated for, COVID-19 should follow the prevention steps below until a healthcare provider or local or state health department says they can return to normal activities.  Stay home except to get medical care You should restrict activities outside your home, except for getting medical care. Do not go to work, school, or public areas, and do not use public transportation or taxis.  Call ahead before visiting your doctor Before your medical appointment, call the healthcare provider and tell them that you have, or are being evaluated for, COVID-19 infection. This will help the healthcare providers office take steps to keep other people from getting infected. Ask your healthcare provider to call the local or state health department.  Monitor your symptoms Seek prompt medical attention if your illness is worsening (e.g., difficulty breathing). Before going to your medical appointment, call the healthcare provider and tell them that you have, or are being evaluated for, COVID-19 infection. Ask your healthcare provider to call the local or state health department.  Wear a facemask You should wear a facemask that covers your nose and mouth when you are in the same room with other people and when you visit a healthcare provider. People who live with or visit you should also wear a facemask while they are in the same room with you.  Separate yourself from other people in your home As much as possible, you should stay in a different room from other people in your home.  Also, you should use a separate bathroom, if available.  Avoid sharing household items You should not share dishes, drinking glasses, cups, eating utensils, towels, bedding, or other items with other people in your home. After using these items, you should wash them thoroughly with soap and water.  Cover your coughs and sneezes Cover your mouth and nose with a tissue when you cough or sneeze, or you can cough or sneeze into your sleeve. Throw used tissues in a lined trash can, and immediately wash your hands with soap and water for at least 20 seconds or use an alcohol-based hand rub.  Wash your [employer]hands Wash your hands often and thoroughly with soap and water for at least 20 seconds. You can use an alcohol-based hand sanitizer if soap and water are not available and if your hands are not visibly dirty. Avoid touching your eyes, nose, and mouth with unwashed hands.   Prevention Steps for Caregivers and Household Members of Individuals Confirmed to have, or Being Evaluated for, COVID-19 Infection Being Cared for in the Home  If you live with, or provide care at home for, a person confirmed to have, or being evaluated for, COVID-19 infection please follow these guidelines to prevent infection:  Follow healthcare providers instructions Make sure that you understand and can help the patient follow any healthcare provider instructions for all care.  Provide for the patients basic needs You should help the patient with basic needs in the home and provide support for getting groceries, prescriptions, and other personal needs.  Monitor the patients symptoms If they are getting sicker, call his or her medical provider and tell  them that the patient has, or is being evaluated for, COVID-19 infection. This will help the healthcare providers office take steps to keep other people from getting infected. Ask the healthcare provider to call the local or state health department.  Limit the  number of people who have contact with the patient If possible, have only one caregiver for the patient. Other household members should stay in another home or place of residence. If this is not possible, they should stay in another room, or be separated from the patient as much as possible. Use a separate bathroom, if available. Restrict visitors who do not have an essential need to be in the home.  Keep older adults, very young children, and other sick people away from the patient Keep older adults, very young children, and those who have compromised immune systems or chronic health conditions away from the patient. This includes people with chronic heart, lung, or kidney conditions, diabetes, and cancer.  Ensure good ventilation Make sure that shared spaces in the home have good air flow, such as from an air conditioner or an opened window, weather permitting.  Wash your hands often Wash your hands often and thoroughly with soap and water for at least 20 seconds. You can use an alcohol based hand sanitizer if soap and water are not available and if your hands are not visibly dirty. Avoid touching your eyes, nose, and mouth with unwashed hands. Use disposable paper towels to dry your hands. If not available, use dedicated cloth towels and replace them when they become wet.  Wear a facemask and gloves Wear a disposable facemask at all times in the room and gloves when you touch or have contact with the patients blood, body fluids, and/or secretions or excretions, such as sweat, saliva, sputum, nasal mucus, vomit, urine, or feces.  Ensure the mask fits over your nose and mouth tightly, and do not touch it during use. Throw out disposable facemasks and gloves after using them. Do not reuse. Wash your hands immediately after removing your facemask and gloves. If your personal clothing becomes contaminated, carefully remove clothing and launder. Wash your hands after handling contaminated  clothing. Place all used disposable facemasks, gloves, and other waste in a lined container before disposing them with other household waste. Remove gloves and wash your hands immediately after handling these items.  Do not share dishes, glasses, or other household items with the patient Avoid sharing household items. You should not share dishes, drinking glasses, cups, eating utensils, towels, bedding, or other items with a patient who is confirmed to have, or being evaluated for, COVID-19 infection. After the person uses these items, you should wash them thoroughly with soap and water.  Wash laundry thoroughly Immediately remove and wash clothes or bedding that have blood, body fluids, and/or secretions or excretions, such as sweat, saliva, sputum, nasal mucus, vomit, urine, or feces, on them. Wear gloves when handling laundry from the patient. Read and follow directions on labels of laundry or clothing items and detergent. In general, wash and dry with the warmest temperatures recommended on the label.  Clean all areas the individual has used often Clean all touchable surfaces, such as counters, tabletops, doorknobs, bathroom fixtures, toilets, phones, keyboards, tablets, and bedside tables, every day. Also, clean any surfaces that may have blood, body fluids, and/or secretions or excretions on them. Wear gloves when cleaning surfaces the patient has come in contact with. Use a diluted bleach solution (e.g., dilute bleach with 1 part bleach and  10 parts water) or a household disinfectant with a label that says EPA-registered for coronaviruses. To make a bleach solution at home, add 1 tablespoon of bleach to 1 quart (4 cups) of water. For a larger supply, add  cup of bleach to 1 gallon (16 cups) of water. Read labels of cleaning products and follow recommendations provided on product labels. Labels contain instructions for safe and effective use of the cleaning product including precautions you  should take when applying the product, such as wearing gloves or eye protection and making sure you have good ventilation during use of the product. Remove gloves and wash hands immediately after cleaning.  Monitor yourself for signs and symptoms of illness Caregivers and household members are considered close contacts, should monitor their health, and will be asked to limit movement outside of the home to the extent possible. Follow the monitoring steps for close contacts listed on the symptom monitoring form.   ? If you have additional questions, contact your local health department or call the epidemiologist on call at [phone] (available 24/7). ? This guidance is subject to change. For the most up-to-date guidance from [facility], please refer to their website: TripMetro.hu

## 2019-01-19 NOTE — ED Notes (Signed)
Strep throat culture ordered and completed. I sent to main lab for processing.

## 2019-01-19 NOTE — ED Provider Notes (Signed)
[facility]    CSN: [id number] Arrival date & time: 01/19/19  1435      History   Chief Complaint Chief Complaint  Patient presents with  . Headache  . Sore Throat  . Generalized Body Aches  . Cough    HPI Rodney Powell is a 43 y.o. male.   HPI  Patient is here with generalized body aches, headache, sore throat, fatigue, and nonproductive cough for 7 days.  At times he thought he might be wheezing.  His cough makes his chest feel "tight".  He went last week and had a coronavirus test.  It was negative.  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  He states he is around "2000 people a day".  No verified coronavirus exposure. He states that he has not gotten better over time.  Discussed that he should have another coronavirus test This is done today. Past Medical History:  Diagnosis Date  . Diabetes mellitus without complication ([place])    TYPE II    Patient Active Problem List   Diagnosis Date Noted  . Abscess of right buttock 01/03/2016  . Elevated blood sugar 01/03/2016  . History of chest pain 01/03/2016    Past Surgical History:  Procedure Laterality Date  . INCISION AND DRAINAGE PERIRECTAL ABSCESS Right 01/04/2016   Procedure: IRRIGATION AND DEBRIDEMENT RIGHT BUTTOCKS  ABSCESS;  Surgeon: Armandina Gemma, MD;  Location: [facility];  Service: General;  Laterality: Right;       Home Medications    Prior to Admission medications   Medication Sig Start Date End Date Taking? Authorizing Provider  blood glucose meter kit and supplies KIT Dispense based on patient and insurance preference. Use up to four times daily as directed. (FOR ICD-9 250.00, 250.01). 01/05/16   Theodis Blaze, MD  metFORMIN (GLUCOPHAGE) 500 MG tablet Take 1 tablet (500 mg total) by mouth 2 (two) times daily with a meal. 01/05/16   Theodis Blaze, MD    Family History Family History  Problem Relation Age of Onset  . Diabetes Mother   . Diabetes Brother   . Diabetes Maternal Grandmother      Social History Social History   Tobacco Use  . Smoking status: Current Every Day Smoker    Packs/day: 1.00    Years: 22.00    Pack years: 22.00    Types: Cigarettes  . Smokeless tobacco: Never Used  Substance Use Topics  . Alcohol use: Yes  . Drug use: No     Allergies   Patient has no known allergies.   Review of Systems Review of Systems  Constitutional: Positive for fatigue. Negative for chills and fever.  HENT: Positive for rhinorrhea and sore throat. Negative for ear pain.   Eyes: Negative for pain and visual disturbance.  Respiratory: Positive for cough and shortness of breath.   Cardiovascular: Negative for chest pain and palpitations.  Gastrointestinal: Negative for abdominal pain and vomiting.  Genitourinary: Negative for dysuria and hematuria.  Musculoskeletal: Negative for arthralgias and back pain.  Skin: Negative for color change and rash.  Neurological: Positive for headaches. Negative for seizures and syncope.  All other systems reviewed and are negative.    Physical Exam Triage Vital Signs ED Triage Vitals  Enc Vitals Group     BP 01/19/19 1537 138/81     Pulse Rate 01/19/19 1537 68     Resp 01/19/19 1537 16     Temp 01/19/19 1537 97.8 F (36.6 C)     Temp  Source 01/19/19 1537 Temporal     SpO2 01/19/19 1537 100 %     Weight --      Height --      Head Circumference --      Peak Flow --      Pain Score 01/19/19 1547 6     Pain Loc --      Pain Edu? --      Excl. in [place]? --    No data found.  Updated Vital Signs BP 138/81 (BP Location: Left Arm)   Pulse 68   Temp 97.8 F (36.6 C) (Temporal)   Resp 16   SpO2 100%        Physical Exam Constitutional:      General: He is not in acute distress.    Appearance: He is well-developed.     Comments: Overweight.  Appears tired  HENT:     Head: Normocephalic and atraumatic.     Mouth/Throat:     Mouth: Mucous membranes are moist.     Pharynx: No posterior oropharyngeal erythema.  Eyes:      Conjunctiva/sclera: Conjunctivae normal.     Pupils: Pupils are equal, round, and reactive to light.  Neck:     Musculoskeletal: Normal range of motion.  Cardiovascular:     Rate and Rhythm: Normal rate and regular rhythm.     Heart sounds: Normal heart sounds.  Pulmonary:     Effort: Pulmonary effort is normal. No respiratory distress.     Breath sounds: Normal breath sounds. No wheezing or rales.     Comments: Lungs are clear Abdominal:     General: There is no distension.     Palpations: Abdomen is soft.  Musculoskeletal: Normal range of motion.  Skin:    General: Skin is warm and dry.  Neurological:     Mental Status: He is alert.  Psychiatric:        Mood and Affect: Mood normal.        Behavior: Behavior normal.      UC Treatments / Results  Labs (all labs ordered are listed, but only abnormal results are displayed) Labs Reviewed  NOVEL CORONAVIRUS, NAA (HOSP ORDER, SEND-OUT TO REF LAB; TAT 18-24 HRS)  CULTURE, GROUP A STREP [facility])  POCT RAPID STREP A    EKG   Radiology No results found.  Procedures Procedures (including critical care time)  Medications Ordered in UC Medications - No data to display  Initial Impression / Assessment and Plan / UC Course  I have reviewed the triage vital signs and the nursing notes.  Pertinent labs & imaging results that were available during my care of the patient were reviewed by me and considered in my medical decision making (see chart for details).     Viral infection.  Impossible to know if it is coronavirus or influenza.  Treatment is the same.  Rest fluids Tylenol.  New test is performed.  Samule Dry is important.  Call for problems Final Clinical Impressions(s) / UC Diagnoses   Final diagnoses:  Viral illness  Suspected COVID-19 virus infection     Discharge Instructions      May take Tylenol for pain and fever Drink plenty of fluids Quarantine until the COVID test is available     Person Under  Monitoring Name: Rodney Powell  Location: [address] [place] [address]   Infection Prevention Recommendations for Individuals Confirmed to have, or Being Evaluated for, 2019 Novel Coronavirus (COVID-19) Infection Who Receive Care at  Home  Individuals who are confirmed to have, or are being evaluated for, COVID-19 should follow the prevention steps below until a healthcare provider or local or state health department says they can return to normal activities.  Stay home except to get medical care You should restrict activities outside your home, except for getting medical care. Do not go to work, school, or public areas, and do not use public transportation or taxis.  Call ahead before visiting your doctor Before your medical appointment, call the healthcare provider and tell them that you have, or are being evaluated for, COVID-19 infection. This will help the healthcare provider's office take steps to keep other people from getting infected. Ask your healthcare provider to call the local or state health department.  Monitor your symptoms Seek prompt medical attention if your illness is worsening (e.g., difficulty breathing). Before going to your medical appointment, call the healthcare provider and tell them that you have, or are being evaluated for, COVID-19 infection. Ask your healthcare provider to call the local or state health department.  Wear a facemask You should wear a facemask that covers your nose and mouth when you are in the same room with other people and when you visit a healthcare provider. People who live with or visit you should also wear a facemask while they are in the same room with you.  Separate yourself from other people in your home As much as possible, you should stay in a different room from other people in your home. Also, you should use a separate bathroom, if available.  Avoid sharing household items You should not share dishes, drinking  glasses, cups, eating utensils, towels, bedding, or other items with other people in your home. After using these items, you should wash them thoroughly with soap and water.  Cover your coughs and sneezes Cover your mouth and nose with a tissue when you cough or sneeze, or you can cough or sneeze into your sleeve. Throw used tissues in a lined trash can, and immediately wash your hands with soap and water for at least 20 seconds or use an alcohol-based hand rub.  Wash your [employer] [employer] your hands often and thoroughly with soap and water for at least 20 seconds. You can use an alcohol-based hand sanitizer if soap and water are not available and if your hands are not visibly dirty. Avoid touching your eyes, nose, and mouth with unwashed hands.   Prevention Steps for Caregivers and Household Members of Individuals Confirmed to have, or Being Evaluated for, COVID-19 Infection Being Cared for in the Home  If you live with, or provide care at home for, a person confirmed to have, or being evaluated for, COVID-19 infection please follow these guidelines to prevent infection:  Follow healthcare provider's instructions Make sure that you understand and can help the patient follow any healthcare provider instructions for all care.  Provide for the patient's basic needs You should help the patient with basic needs in the home and provide support for getting groceries, prescriptions, and other personal needs.  Monitor the patient's symptoms If they are getting sicker, call his or her medical provider and tell them that the patient has, or is being evaluated for, COVID-19 infection. This will help the healthcare provider's office take steps to keep other people from getting infected. Ask the healthcare provider to call the local or state health department.  Limit the number of people who have contact with the patient  If possible, have only one caregiver  for the patient.  Other household members  should stay in another home or place of residence. If this is not possible, they should stay  in another room, or be separated from the patient as much as possible. Use a separate bathroom, if available.  Restrict visitors who do not have an essential need to be in the home.  Keep older adults, very young children, and other sick people away from the patient Keep older adults, very young children, and those who have compromised immune systems or chronic health conditions away from the patient. This includes people with chronic heart, lung, or kidney conditions, diabetes, and cancer.  Ensure good ventilation Make sure that shared spaces in the home have good air flow, such as from an air conditioner or an opened window, weather permitting.  Wash your hands often  Wash your hands often and thoroughly with soap and water for at least 20 seconds. You can use an alcohol based hand sanitizer if soap and water are not available and if your hands are not visibly dirty.  Avoid touching your eyes, nose, and mouth with unwashed hands.  Use disposable paper towels to dry your hands. If not available, use dedicated cloth towels and replace them when they become wet.  Wear a facemask and gloves  Wear a disposable facemask at all times in the room and gloves when you touch or have contact with the patient's blood, body fluids, and/or secretions or excretions, such as sweat, saliva, sputum, nasal mucus, vomit, urine, or feces.  Ensure the mask fits over your nose and mouth tightly, and do not touch it during use.  Throw out disposable facemasks and gloves after using them. Do not reuse.  Wash your hands immediately after removing your facemask and gloves.  If your personal clothing becomes contaminated, carefully remove clothing and launder. Wash your hands after handling contaminated clothing.  Place all used disposable facemasks, gloves, and other waste in a lined container before disposing them  with other household waste.  Remove gloves and wash your hands immediately after handling these items.  Do not share dishes, glasses, or other household items with the patient  Avoid sharing household items. You should not share dishes, drinking glasses, cups, eating utensils, towels, bedding, or other items with a patient who is confirmed to have, or being evaluated for, COVID-19 infection.  After the person uses these items, you should wash them thoroughly with soap and water.  Wash laundry thoroughly  Immediately remove and wash clothes or bedding that have blood, body fluids, and/or secretions or excretions, such as sweat, saliva, sputum, nasal mucus, vomit, urine, or feces, on them.  Wear gloves when handling laundry from the patient.  Read and follow directions on labels of laundry or clothing items and detergent. In general, wash and dry with the warmest temperatures recommended on the label.  Clean all areas the individual has used often  Clean all touchable surfaces, such as counters, tabletops, doorknobs, bathroom fixtures, toilets, phones, keyboards, tablets, and bedside tables, every day. Also, clean any surfaces that may have blood, body fluids, and/or secretions or excretions on them.  Wear gloves when cleaning surfaces the patient has come in contact with.  Use a diluted bleach solution (e.g., dilute bleach with 1 part bleach and 10 parts water) or a household disinfectant with a label that says EPA-registered for coronaviruses. To make a bleach solution at home, add 1 tablespoon of bleach to 1 quart (4 cups) of water. For a larger supply,  add  cup of bleach to 1 gallon (16 cups) of water.  Read labels of cleaning products and follow recommendations provided on product labels. Labels contain instructions for safe and effective use of the cleaning product including precautions you should take when applying the product, such as wearing gloves or eye protection and making sure  you have good ventilation during use of the product.  Remove gloves and wash hands immediately after cleaning.  Monitor yourself for signs and symptoms of illness Caregivers and household members are considered close contacts, should monitor their health, and will be asked to limit movement outside of the home to the extent possible. Follow the monitoring steps for close contacts listed on the symptom monitoring form.   ? If you have additional questions, contact your local health department or call the epidemiologist on call at [phone] (available 24/7). ? This guidance is subject to change. For the most up-to-date guidance from [facility], please refer to their website: YouBlogs.pl    ED Prescriptions    None     PDMP not reviewed this encounter.   Raylene Everts, MD 01/19/19 2032

## 2019-01-19 NOTE — ED Triage Notes (Signed)
Pt presents with generalized body ache, headache, sore throat, fatigue, and ongoing non productive cough X 7 days with no relief.

## 2019-01-19 NOTE — ED Notes (Signed)
Patient states tested negative for Covid on Thursday 10/8

## 2019-01-22 LAB — CULTURE, GROUP A STREP (THRC)

## 2019-01-22 LAB — NOVEL CORONAVIRUS, NAA (HOSP ORDER, SEND-OUT TO REF LAB; TAT 18-24 HRS): SARS-CoV-2, NAA: NOT DETECTED

## 2019-06-24 ENCOUNTER — Other Ambulatory Visit: Payer: Self-pay

## 2019-06-24 ENCOUNTER — Encounter (HOSPITAL_BASED_OUTPATIENT_CLINIC_OR_DEPARTMENT_OTHER): Payer: Self-pay | Admitting: Emergency Medicine

## 2019-06-24 ENCOUNTER — Emergency Department (HOSPITAL_BASED_OUTPATIENT_CLINIC_OR_DEPARTMENT_OTHER)
Admission: EM | Admit: 2019-06-24 | Discharge: 2019-06-24 | Disposition: A | Discharge status: Home / Self Care | Payer: Self-pay | Attending: Emergency Medicine | Admitting: Emergency Medicine

## 2019-06-24 DIAGNOSIS — F17210 Nicotine dependence, cigarettes, uncomplicated: Secondary | ICD-10-CM | POA: Insufficient documentation

## 2019-06-24 DIAGNOSIS — M5442 Lumbago with sciatica, left side: Secondary | ICD-10-CM | POA: Insufficient documentation

## 2019-06-24 HISTORY — DX: Dorsalgia, unspecified: M54.9

## 2019-06-24 MED ORDER — IBUPROFEN 400 MG PO TABS
600.0000 mg | ORAL_TABLET | Freq: Once | ORAL | Status: AC
  Administered 2019-06-24: 600 mg via ORAL
  Filled 2019-06-24: qty 1

## 2019-06-24 MED ORDER — LIDOCAINE 5 % EX PTCH
1.0000 | MEDICATED_PATCH | CUTANEOUS | Status: DC
  Administered 2019-06-24: 1 via TRANSDERMAL
  Filled 2019-06-24: qty 1

## 2019-06-24 MED ORDER — PREDNISONE 10 MG (21) PO TBPK: ORAL_TABLET | ORAL | 0 refills | Status: DC

## 2019-06-24 MED ORDER — PREDNISONE 50 MG PO TABS
60.0000 mg | ORAL_TABLET | Freq: Once | ORAL | Status: AC
  Administered 2019-06-24: 60 mg via ORAL
  Filled 2019-06-24: qty 1

## 2019-06-24 MED ORDER — IBUPROFEN 600 MG PO TABS: 600.0000 mg | ORAL_TABLET | Freq: Four times a day (QID) | ORAL | 0 refills | Status: DC | PRN

## 2019-06-24 MED ORDER — METHOCARBAMOL 750 MG PO TABS: 750.0000 mg | ORAL_TABLET | Freq: Two times a day (BID) | ORAL | 0 refills | Status: DC | PRN

## 2019-06-24 MED ORDER — LIDOCAINE 5 % EX PTCH: 1.0000 | MEDICATED_PATCH | CUTANEOUS | 0 refills | Status: DC

## 2019-06-24 NOTE — ED Triage Notes (Signed)
Lower back pain. Hx of back pain.PT ambulatory to triage. Pain started last week. Denies n/t.

## 2019-06-24 NOTE — ED Provider Notes (Signed)
[facility] Provider Note   CSN: [id number] Arrival date & time: 06/24/19  1944     History Chief Complaint  Patient presents with  . Back Pain    Rodney Powell is a 44 y.o. male.  HPI      Rodney Powell is a 44 y.o. male, with a history of back pain, presenting to the ED with low back pain beginning about a year ago after a lifting injury.  Increased physical requirements at work the last three weeks has worsened his pain. Pain is described as a soreness and tightness, moderate in intensity, radiating down the back of the left leg. He states he does not take any medications for any condition. Denies fever/chills, N/V/C/D, abdominal pain, chest pain, shortness of breath, urinary complaints, changes in bowel/bladder function, saddle anesthesias, trauma/falls, or any other complaints.    Past Medical History:  Diagnosis Date  . Back pain     Patient Active Problem List   Diagnosis Date Noted  . Abscess of right buttock 01/03/2016  . Elevated blood sugar 01/03/2016  . History of chest pain 01/03/2016    Past Surgical History:  Procedure Laterality Date  . INCISION AND DRAINAGE PERIRECTAL ABSCESS Right 01/04/2016   Procedure: IRRIGATION AND DEBRIDEMENT RIGHT BUTTOCKS  ABSCESS;  Surgeon: Armandina Gemma, MD;  Location: [facility];  Service: General;  Laterality: Right;       Family History  Problem Relation Age of Onset  . Diabetes Mother   . Diabetes Brother   . Diabetes Maternal Grandmother     Social History   Tobacco Use  . Smoking status: Current Every Day Smoker    Packs/day: 1.00    Years: 22.00    Pack years: 22.00    Types: Cigarettes  . Smokeless tobacco: Never Used  Substance Use Topics  . Alcohol use: Yes  . Drug use: No    Home Medications Prior to Admission medications   Medication Sig Start Date End Date Taking? Authorizing Provider  blood glucose meter kit and supplies KIT Dispense based on patient and insurance  preference. Use up to four times daily as directed. (FOR ICD-9 250.00, 250.01). 01/05/16   Theodis Blaze, MD  ibuprofen (ADVIL) 600 MG tablet Take 1 tablet (600 mg total) by mouth every 6 (six) hours as needed. 06/24/19   Jalayiah Bibian C, PA-C  lidocaine (LIDODERM) 5 % Place 1 patch onto the skin daily. Remove & Discard patch within 12 hours or as directed by MD 06/24/19   Arlean Hopping C, PA-C  metFORMIN (GLUCOPHAGE) 500 MG tablet Take 1 tablet (500 mg total) by mouth 2 (two) times daily with a meal. 01/05/16   Theodis Blaze, MD  methocarbamol (ROBAXIN) 750 MG tablet Take 1 tablet (750 mg total) by mouth 2 (two) times daily as needed for muscle spasms (or muscle tightness). 06/24/19   Lurline Caver C, PA-C  predniSONE (STERAPRED UNI-PAK 21 TAB) 10 MG (21) TBPK tablet Take 6 tabs (6m) day 1, 5 tabs (561m day 2, 4 tabs (4042mday 3, 3 tabs (74m22may 4, 2 tabs (20mg15my 5, and 1 tab (10mg)58m 6. 06/24/19   Tacha Manni C, PA-C    Allergies    Patient has no known allergies.  Review of Systems   Review of Systems  Constitutional: Negative for chills, diaphoresis and fever.  Respiratory: Negative for shortness of breath.   Cardiovascular: Negative for chest pain.  Gastrointestinal: Negative for abdominal pain, constipation, diarrhea,  nausea and vomiting.  Genitourinary: Negative for dysuria, frequency and hematuria.  Musculoskeletal: Positive for back pain.  Neurological: Negative for dizziness, syncope, weakness, light-headedness and numbness.  All other systems reviewed and are negative.   Physical Exam Updated Vital Signs BP (!) 160/106 (BP Location: Left Arm)   Pulse 76   Temp 98.2 F (36.8 C) (Oral)   Resp 20   Ht 5' 8"  (1.727 m)   Wt 95.3 kg   SpO2 98%   BMI 31.93 kg/m   Physical Exam Vitals and nursing note reviewed.  Constitutional:      General: He is not in acute distress.    Appearance: He is well-developed. He is not diaphoretic.  HENT:     Head: Normocephalic and atraumatic.      Mouth/Throat:     Mouth: Mucous membranes are moist.     Pharynx: Oropharynx is clear.  Eyes:     Conjunctiva/sclera: Conjunctivae normal.  Cardiovascular:     Rate and Rhythm: Normal rate and regular rhythm.     Pulses: Normal pulses.          Radial pulses are 2+ on the right side and 2+ on the left side.       Posterior tibial pulses are 2+ on the right side and 2+ on the left side.     Heart sounds: Normal heart sounds.     Comments: Tactile temperature in the extremities appropriate and equal bilaterally. Pulmonary:     Effort: Pulmonary effort is normal. No respiratory distress.     Breath sounds: Normal breath sounds.  Abdominal:     Palpations: Abdomen is soft.     Tenderness: There is no abdominal tenderness. There is no guarding.  Musculoskeletal:     Cervical back: Neck supple.     Right lower leg: No edema.     Left lower leg: No edema.     Comments: Tenderness in the left lumbar musculature and buttocks. Normal motor function intact in all extremities. No midline spinal tenderness.   Lymphadenopathy:     Cervical: No cervical adenopathy.  Skin:    General: Skin is warm and dry.  Neurological:     Mental Status: He is alert.     Comments: Sensation grossly intact to light touch in the lower extremities bilaterally. No saddle anesthesias. Strength 5/5 in the bilateral lower extremities. No noted gait deficit. Coordination intact.  Psychiatric:        Mood and Affect: Mood and affect normal.        Speech: Speech normal.        Behavior: Behavior normal.     ED Results / Procedures / Treatments   Labs (all labs ordered are listed, but only abnormal results are displayed) Labs Reviewed - No data to display  EKG None  Radiology No results found.  Procedures Procedures (including critical care time)  Medications Ordered in ED Medications  lidocaine (LIDODERM) 5 % 1 patch (1 patch Transdermal Patch Applied 06/24/19 2048)  ibuprofen (ADVIL) tablet  600 mg (600 mg Oral Given 06/24/19 2048)  predniSONE (DELTASONE) tablet 60 mg (60 mg Oral Given 06/24/19 2048)    ED Course  I have reviewed the triage vital signs and the nursing notes.  Pertinent labs & imaging results that were available during my care of the patient were reviewed by me and considered in my medical decision making (see chart for details).    MDM Rules/Calculators/A&P  Patient presents with acute on chronic lower back pain.  Symptoms are suggestive of sciatica.  No focal neurologic deficits or symptoms/findings suggestive of neurosurgical emergency, such as cauda equina. The patient was given instructions for home care as well as return precautions. Patient voices understanding of these instructions, accepts the plan, and is comfortable with discharge.  Patient's blood pressure noted to be higher than normal here today.  He does not take medication for this.  Doubt hypertensive emergency.  Recommended patient follow-up with a primary care provider on this matter.  Resources given.  Final Clinical Impression(s) / ED Diagnoses Final diagnoses:  Acute left-sided low back pain with left-sided sciatica    Rx / DC Orders ED Discharge Orders         Ordered    predniSONE (STERAPRED UNI-PAK 21 TAB) 10 MG (21) TBPK tablet     06/24/19 2048    lidocaine (LIDODERM) 5 %  Every 24 hours     06/24/19 2048    methocarbamol (ROBAXIN) 750 MG tablet  2 times daily PRN     06/24/19 2048    ibuprofen (ADVIL) 600 MG tablet  Every 6 hours PRN     06/24/19 2048           Lorayne Bender, PA-C 06/24/19 2055    Margette Fast, MD 06/25/19 1244

## 2019-06-24 NOTE — Discharge Instructions (Addendum)
  Take it easy, but do not lay around too much as this may make any stiffness worse.  Antiinflammatory medications: Take 600 mg of ibuprofen every 6 hours or 440 mg (over the counter dose) to 500 mg (prescription dose) of naproxen every 12 hours for the next 3 days. After this time, these medications may be used as needed for pain. Take these medications with food to avoid upset stomach. Choose only one of these medications, do not take them together. Acetaminophen (generic for Tylenol): Should you continue to have additional pain while taking the ibuprofen or naproxen, you may add in acetaminophen as needed. Your daily total maximum amount of acetaminophen from all sources should be limited to 4000mg /day for persons without liver problems, or 2000mg /day for those with liver problems. Methocarbamol: Methocarbamol (generic for Robaxin) is a muscle relaxer and can help relieve stiff muscles or muscle spasms.  Do not drive or perform other dangerous activities while taking this medication as it can cause drowsiness as well as changes in reaction time and judgement. Lidocaine patches: These are available via either prescription or over-the-counter. The over-the-counter option may be more economical one and are likely just as effective. There are multiple over-the-counter brands, such as Salonpas. Prednisone: Take the prednisone, as prescribed, until finished. If you are a diabetic, please know prednisone can raise your blood sugar temporarily. Ice: May apply ice to the area over the next 24 hours for 15 minutes at a time to reduce pain, inflammation, and swelling, if present. Exercises: Be sure to perform the attached exercises starting with three times a week and working up to performing them daily. This is an essential part of preventing long term problems.  Follow up: Follow up with a primary care provider or orthopedic specialist for any future management of these complaints. Be sure to follow up within 7-10  days. Return: Return to the ED should symptoms worsen.  For prescription assistance, may try using prescription discount sites or apps, such as goodrx.com  Your blood pressure was noted to be higher than normal today.  Please follow-up with your primary care provider for any further management of this issue.

## 2019-08-13 ENCOUNTER — Encounter (HOSPITAL_BASED_OUTPATIENT_CLINIC_OR_DEPARTMENT_OTHER): Payer: Self-pay | Admitting: *Deleted

## 2019-08-13 ENCOUNTER — Emergency Department (HOSPITAL_BASED_OUTPATIENT_CLINIC_OR_DEPARTMENT_OTHER)
Admission: EM | Admit: 2019-08-13 | Discharge: 2019-08-13 | Disposition: A | Discharge status: Home / Self Care | Payer: Self-pay | Attending: Emergency Medicine | Admitting: Emergency Medicine

## 2019-08-13 ENCOUNTER — Other Ambulatory Visit: Payer: Self-pay

## 2019-08-13 DIAGNOSIS — Z7984 Long term (current) use of oral hypoglycemic drugs: Secondary | ICD-10-CM | POA: Insufficient documentation

## 2019-08-13 DIAGNOSIS — R112 Nausea with vomiting, unspecified: Secondary | ICD-10-CM | POA: Insufficient documentation

## 2019-08-13 DIAGNOSIS — R197 Diarrhea, unspecified: Secondary | ICD-10-CM | POA: Insufficient documentation

## 2019-08-13 DIAGNOSIS — J069 Acute upper respiratory infection, unspecified: Secondary | ICD-10-CM | POA: Insufficient documentation

## 2019-08-13 DIAGNOSIS — F17210 Nicotine dependence, cigarettes, uncomplicated: Secondary | ICD-10-CM | POA: Insufficient documentation

## 2019-08-13 DIAGNOSIS — E119 Type 2 diabetes mellitus without complications: Secondary | ICD-10-CM | POA: Insufficient documentation

## 2019-08-13 DIAGNOSIS — E860 Dehydration: Secondary | ICD-10-CM | POA: Insufficient documentation

## 2019-08-13 HISTORY — DX: Type 2 diabetes mellitus without complications: E11.9

## 2019-08-13 LAB — CBC WITH DIFFERENTIAL/PLATELET
Abs Immature Granulocytes: 0.01 10*3/uL (ref 0.00–0.07)
Basophils Absolute: 0 10*3/uL (ref 0.0–0.1)
Basophils Relative: 0 %
Eosinophils Absolute: 0.1 10*3/uL (ref 0.0–0.5)
Eosinophils Relative: 2 %
HCT: 43.2 % (ref 39.0–52.0)
Hemoglobin: 15.3 g/dL (ref 13.0–17.0)
Immature Granulocytes: 0 %
Lymphocytes Relative: 57 %
Lymphs Abs: 2.7 10*3/uL (ref 0.7–4.0)
MCH: 30.7 pg (ref 26.0–34.0)
MCHC: 35.4 g/dL (ref 30.0–36.0)
MCV: 86.6 fL (ref 80.0–100.0)
Monocytes Absolute: 0.4 10*3/uL (ref 0.1–1.0)
Monocytes Relative: 7 %
Neutro Abs: 1.6 10*3/uL — ABNORMAL LOW (ref 1.7–7.7)
Neutrophils Relative %: 34 %
Platelets: 154 10*3/uL (ref 150–400)
RBC: 4.99 MIL/uL (ref 4.22–5.81)
RDW: 12.3 % (ref 11.5–15.5)
WBC: 4.8 10*3/uL (ref 4.0–10.5)
nRBC: 0 % (ref 0.0–0.2)

## 2019-08-13 LAB — URINALYSIS, ROUTINE W REFLEX MICROSCOPIC
Bilirubin Urine: NEGATIVE
Glucose, UA: 100 mg/dL — AB
Ketones, ur: NEGATIVE mg/dL
Leukocytes,Ua: NEGATIVE
Nitrite: NEGATIVE
Protein, ur: 30 mg/dL — AB
Specific Gravity, Urine: 1.025 (ref 1.005–1.030)
pH: 6 (ref 5.0–8.0)

## 2019-08-13 LAB — BASIC METABOLIC PANEL
Anion gap: 6 (ref 5–15)
BUN: 12 mg/dL (ref 6–20)
CO2: 27 mmol/L (ref 22–32)
Calcium: 8.6 mg/dL — ABNORMAL LOW (ref 8.9–10.3)
Chloride: 104 mmol/L (ref 98–111)
Creatinine, Ser: 0.92 mg/dL (ref 0.61–1.24)
GFR calc Af Amer: >60 mL/min (ref >60)
GFR calc non Af Amer: >60 mL/min (ref >60)
Glucose, Bld: 248 mg/dL — ABNORMAL HIGH (ref 70–99)
Potassium: 4.2 mmol/L (ref 3.5–5.1)
Sodium: 137 mmol/L (ref 135–145)

## 2019-08-13 LAB — URINALYSIS, MICROSCOPIC (REFLEX): Bacteria, UA: NONE SEEN

## 2019-08-13 MED ORDER — SODIUM CHLORIDE 0.9 % IV BOLUS
1000.0000 mL | Freq: Once | INTRAVENOUS | Status: AC
  Administered 2019-08-13: 1000 mL via INTRAVENOUS

## 2019-08-13 MED ORDER — ONDANSETRON HCL 4 MG/2ML IJ SOLN
4.0000 mg | Freq: Once | INTRAMUSCULAR | Status: AC
  Administered 2019-08-13: 01:00:00 4 mg via INTRAVENOUS
  Filled 2019-08-13: qty 2

## 2019-08-13 MED ORDER — ONDANSETRON 8 MG PO TBDP: 8.0000 mg | ORAL_TABLET | Freq: Three times a day (TID) | ORAL | 0 refills | Status: DC | PRN

## 2019-08-13 MED ORDER — MUCINEX DM MAXIMUM STRENGTH 60-1200 MG PO TB12: 1.0000 | ORAL_TABLET | Freq: Two times a day (BID) | ORAL | 0 refills | Status: DC

## 2019-08-13 MED ORDER — OXYMETAZOLINE HCL 0.05 % NA SOLN
2.0000 | Freq: Two times a day (BID) | NASAL | Status: DC | PRN
  Administered 2019-08-13: 01:00:00 2 via NASAL
  Filled 2019-08-13: qty 30

## 2019-08-13 MED ORDER — SODIUM CHLORIDE 0.9 % IV BOLUS: 1000.0000 mL | Freq: Once | INTRAVENOUS | Status: DC

## 2019-08-13 NOTE — ED Triage Notes (Signed)
N/V/D x 4-5 days. Also reports URI symptoms x 2 days.

## 2019-08-13 NOTE — ED Provider Notes (Signed)
[facility] Provider Note: Georgena Spurling, MD, FACEP  CSN: [id number] MRN: [id number] ARRIVAL: 08/13/19 at [place]: [id number]   CHIEF COMPLAINT  Vomiting   HISTORY OF PRESENT ILLNESS  08/13/19 12:44 AM Masato Asch is a 44 y.o. male with a 4-day history of nausea, vomiting and diarrhea.  The vomiting has been fairly persistent but the diarrhea is resolving.  He denies any significant associated abdominal pain.  He feels weak and dehydrated.  He has also had nasal congestion and puffy eyes with cough.  He feels like he cannot breathe through his nose especially at night and this causes him to gag in his sleep.  He complains of a "cold sore" to his left lower lip.   Past Medical History:  Diagnosis Date  . Back pain   . Diabetes [facility])     Past Surgical History:  Procedure Laterality Date  . INCISION AND DRAINAGE PERIRECTAL ABSCESS Right 01/04/2016   Procedure: IRRIGATION AND DEBRIDEMENT RIGHT BUTTOCKS  ABSCESS;  Surgeon: Armandina Gemma, MD;  Location: [facility];  Service: General;  Laterality: Right;    Family History  Problem Relation Age of Onset  . Diabetes Mother   . Diabetes Brother   . Diabetes Maternal Grandmother     Social History   Tobacco Use  . Smoking status: Current Every Day Smoker    Packs/day: 1.00    Years: 22.00    Pack years: 22.00    Types: Cigarettes  . Smokeless tobacco: Never Used  Substance Use Topics  . Alcohol use: Yes  . Drug use: No    Prior to Admission medications   Medication Sig Start Date End Date Taking? Authorizing Provider  blood glucose meter kit and supplies KIT Dispense based on patient and insurance preference. Use up to four times daily as directed. (FOR ICD-9 250.00, 250.01). 01/05/16   Theodis Blaze, MD  Dextromethorphan-guaiFENesin [facility] DM MAXIMUM STRENGTH) 60-1200 MG TB12 Take 1 tablet by mouth in the morning and at bedtime. 08/13/19   Railee Bonillas, MD  metFORMIN (GLUCOPHAGE) 500 MG tablet Take 1 tablet (500  mg total) by mouth 2 (two) times daily with a meal. 01/05/16   Theodis Blaze, MD  ondansetron (ZOFRAN ODT) 8 MG disintegrating tablet Take 1 tablet (8 mg total) by mouth every 8 (eight) hours as needed for nausea or vomiting. 08/13/19   Lean Fayson, Jenny Reichmann, MD    Allergies Patient has no known allergies.   REVIEW OF SYSTEMS  Negative except as noted here or in the History of Present Illness.   PHYSICAL EXAMINATION  Initial Vital Signs Blood pressure (!) 161/98, pulse 78, temperature 98.6 F (37 C), temperature source Oral, resp. rate 18, height 5' 8"  (1.727 m), weight 93 kg, SpO2 100 %.  Examination General: Well-developed, well-nourished male in no acute distress; appearance consistent with age of record HENT: normocephalic; atraumatic; shallow ulceration of left lower lip; nasal congestion; no pharyngeal erythema or exudate Eyes: pupils equal, round and reactive to light; extraocular muscles intact Neck: supple Heart: regular rate and rhythm Lungs: clear to auscultation bilaterally Abdomen: soft; nondistended; nontender; bowel sounds present Extremities: No deformity; full range of motion; trace edema of lower legs Neurologic: Awake, alert and oriented; motor function intact in all extremities and symmetric; no facial droop Skin: Warm and dry Psychiatric: Normal mood and affect   RESULTS  Summary of this visit's results, reviewed and interpreted by myself:   EKG Interpretation  Date/Time:    Ventricular Rate:  PR Interval:    QRS Duration:   QT Interval:    QTC Calculation:   R Axis:     Text Interpretation:        Laboratory Studies: Results for orders placed or performed during the hospital encounter of 08/13/19 (from the past 24 hour(s))  Basic metabolic panel     Status: Abnormal   Collection Time: 08/13/19 12:55 AM  Result Value Ref Range   Sodium 137 135 - 145 mmol/L   Potassium 4.2 3.5 - 5.1 mmol/L   Chloride 104 98 - 111 mmol/L   CO2 27 22 - 32 mmol/L    Glucose, Bld 248 (H) 70 - 99 mg/dL   BUN 12 6 - 20 mg/dL   Creatinine, Ser 0.92 0.61 - 1.24 mg/dL   Calcium 8.6 (L) 8.9 - 10.3 mg/dL   GFR calc non Af Amer >60 >60 mL/min   GFR calc Af Amer >60 >60 mL/min   Anion gap 6 5 - 15  CBC with Differential/Platelet     Status: Abnormal   Collection Time: 08/13/19 12:55 AM  Result Value Ref Range   WBC 4.8 4.0 - 10.5 K/uL   RBC 4.99 4.22 - 5.81 MIL/uL   Hemoglobin 15.3 13.0 - 17.0 g/dL   HCT 43.2 39.0 - 52.0 %   MCV 86.6 80.0 - 100.0 fL   MCH 30.7 26.0 - 34.0 pg   MCHC 35.4 30.0 - 36.0 g/dL   RDW 12.3 11.5 - 15.5 %   Platelets 154 150 - 400 K/uL   nRBC 0.0 0.0 - 0.2 %   Neutrophils Relative % 34 %   Neutro Abs 1.6 (L) 1.7 - 7.7 K/uL   Lymphocytes Relative 57 %   Lymphs Abs 2.7 0.7 - 4.0 K/uL   Monocytes Relative 7 %   Monocytes Absolute 0.4 0.1 - 1.0 K/uL   Eosinophils Relative 2 %   Eosinophils Absolute 0.1 0.0 - 0.5 K/uL   Basophils Relative 0 %   Basophils Absolute 0.0 0.0 - 0.1 K/uL   Immature Granulocytes 0 %   Abs Immature Granulocytes 0.01 0.00 - 0.07 K/uL  Urinalysis, Routine w reflex microscopic     Status: Abnormal   Collection Time: 08/13/19  2:23 AM  Result Value Ref Range   Color, Urine YELLOW YELLOW   APPearance CLEAR CLEAR   Specific Gravity, Urine 1.025 1.005 - 1.030   pH 6.0 5.0 - 8.0   Glucose, UA 100 (A) NEGATIVE mg/dL   Hgb urine dipstick TRACE (A) NEGATIVE   Bilirubin Urine NEGATIVE NEGATIVE   Ketones, ur NEGATIVE NEGATIVE mg/dL   Protein, ur 30 (A) NEGATIVE mg/dL   Nitrite NEGATIVE NEGATIVE   Leukocytes,Ua NEGATIVE NEGATIVE  Urinalysis, Microscopic (reflex)     Status: None   Collection Time: 08/13/19  2:23 AM  Result Value Ref Range   RBC / HPF 0-5 0 - 5 RBC/hpf   WBC, UA 0-5 0 - 5 WBC/hpf   Bacteria, UA NONE SEEN NONE SEEN   Squamous Epithelial / LPF 0-5 0 - 5   Hyaline Casts, UA PRESENT    Granular Casts, UA PRESENT    Imaging Studies: No results found.  ED COURSE and MDM  Nursing notes,  initial and subsequent vitals signs, including pulse oximetry, reviewed and interpreted by myself.  Vitals:   08/13/19 0042  BP: (!) 161/98  Pulse: 78  Resp: 18  Temp: 98.6 F (37 C)  TempSrc: Oral  SpO2: 100%  Weight: 93 kg  Height: 5' 8"  (1.727 m)   Medications  oxymetazoline (AFRIN) 0.05 % nasal spray 2 spray (2 sprays Each Nare Given 08/13/19 0104)  sodium chloride 0.9 % bolus 1,000 mL (has no administration in time range)  sodium chloride 0.9 % bolus 1,000 mL ( Intravenous Stopped 08/13/19 0205)  ondansetron (ZOFRAN) injection 4 mg (4 mg Intravenous Given 08/13/19 0104)   Patient given 2 L of normal saline bolus.  Sugar is elevated but patient does have a history of hyperglycemia and is currently on Metformin.  Patient given Afrin (3 days limit) for nasal congestion.  We will also treat with Mucinex DM.   PROCEDURES  Procedures   ED DIAGNOSES     ICD-10-CM   1. Nausea, vomiting and diarrhea  R11.2    R19.7   2. Dehydration  E86.0   3. Viral URI with cough  J06.9        Kegan Mckeithan, Jenny Reichmann, MD 08/13/19 [phone]

## 2019-08-13 NOTE — ED Notes (Signed)
PT UNABLE TO GIVE URINE SAMPLE AT THIS TIME. NO EMESIS OR DIARRHEA SINCE ARRIVAL.

## 2019-08-13 NOTE — ED Notes (Signed)
ED Provider at bedside. 

## 2019-08-13 NOTE — ED Notes (Signed)
PT tolerated po fluids. No emesis or diarrhea since arrival.

## 2020-03-02 ENCOUNTER — Other Ambulatory Visit: Payer: Self-pay

## 2020-03-02 ENCOUNTER — Encounter (HOSPITAL_BASED_OUTPATIENT_CLINIC_OR_DEPARTMENT_OTHER): Payer: Self-pay

## 2020-03-02 ENCOUNTER — Emergency Department (HOSPITAL_BASED_OUTPATIENT_CLINIC_OR_DEPARTMENT_OTHER)
Admission: EM | Admit: 2020-03-02 | Discharge: 2020-03-03 | Disposition: A | Discharge status: Home / Self Care | Payer: Self-pay | Attending: Emergency Medicine | Admitting: Emergency Medicine

## 2020-03-02 DIAGNOSIS — L299 Pruritus, unspecified: Secondary | ICD-10-CM | POA: Insufficient documentation

## 2020-03-02 DIAGNOSIS — H1033 Unspecified acute conjunctivitis, bilateral: Secondary | ICD-10-CM | POA: Insufficient documentation

## 2020-03-02 DIAGNOSIS — Z7984 Long term (current) use of oral hypoglycemic drugs: Secondary | ICD-10-CM | POA: Insufficient documentation

## 2020-03-02 DIAGNOSIS — F17290 Nicotine dependence, other tobacco product, uncomplicated: Secondary | ICD-10-CM | POA: Insufficient documentation

## 2020-03-02 DIAGNOSIS — E119 Type 2 diabetes mellitus without complications: Secondary | ICD-10-CM | POA: Insufficient documentation

## 2020-03-02 NOTE — ED Triage Notes (Signed)
Pt c/o left eye drainage/itching started yesterday-right eye drainage/itching started today-NAD-steady gait

## 2020-03-03 MED ORDER — TETRACAINE HCL 0.5 % OP SOLN
OPHTHALMIC | Status: AC
  Filled 2020-03-03: qty 4

## 2020-03-03 MED ORDER — FLUORESCEIN SODIUM 1 MG OP STRP
ORAL_STRIP | OPHTHALMIC | Status: AC
  Filled 2020-03-03: qty 1

## 2020-03-03 MED ORDER — ERYTHROMYCIN 5 MG/GM OP OINT
TOPICAL_OINTMENT | Freq: Three times a day (TID) | OPHTHALMIC | Status: DC
  Filled 2020-03-03: qty 3.5

## 2020-03-03 MED ORDER — FLUORESCEIN SODIUM 1 MG OP STRP: 1.0000 | ORAL_STRIP | Freq: Once | OPHTHALMIC | Status: AC

## 2020-03-03 MED ORDER — TETRACAINE HCL 0.5 % OP SOLN: 2.0000 [drp] | Freq: Once | OPHTHALMIC | Status: AC

## 2020-03-03 NOTE — ED Notes (Signed)
ED Provider at bedside. 

## 2020-03-03 NOTE — ED Provider Notes (Signed)
[facility] DEPARTMENT Provider Note   CSN: [id number] Arrival date & time: 03/02/20  2214     History Chief Complaint  Patient presents with  . Eye Drainage    Rodney Powell is a 44 y.o. male.  The history is provided by the patient.  Eye Problem Location:  Both eyes Quality:  Aching Severity:  Mild Duration:  2 days Timing:  Intermittent Progression:  Worsening Chronicity:  New Context: not chemical exposure, not contact lens problem, not direct trauma and not foreign body   Relieved by:  Nothing Worsened by:  Nothing Associated symptoms: crusting, discharge and itching   Associated symptoms: no blurred vision and no decreased vision   Patient reports left eye swelling and drainage and now has in his right eye.  Does not wear contact lenses.  No visual loss     Past Medical History:  Diagnosis Date  . Back pain   . Diabetes [facility])     Patient Active Problem List   Diagnosis Date Noted  . Abscess of right buttock 01/03/2016  . Elevated blood sugar 01/03/2016  . History of chest pain 01/03/2016    Past Surgical History:  Procedure Laterality Date  . INCISION AND DRAINAGE PERIRECTAL ABSCESS Right 01/04/2016   Procedure: IRRIGATION AND DEBRIDEMENT RIGHT BUTTOCKS  ABSCESS;  Surgeon: Armandina Gemma, MD;  Location: [facility];  Service: General;  Laterality: Right;       Family History  Problem Relation Age of Onset  . Diabetes Mother   . Diabetes Brother   . Diabetes Maternal Grandmother     Social History   Tobacco Use  . Smoking status: Current Every Day Smoker    Packs/day: 1.00    Years: 22.00    Pack years: 22.00    Types: Cigars  . Smokeless tobacco: Never Used  Vaping Use  . Vaping Use: Never used  Substance Use Topics  . Alcohol use: Not Currently  . Drug use: No    Home Medications Prior to Admission medications   Medication Sig Start Date End Date Taking? Authorizing Provider  blood glucose meter kit and supplies KIT  Dispense based on patient and insurance preference. Use up to four times daily as directed. (FOR ICD-9 250.00, 250.01). 01/05/16   Theodis Blaze, MD  Dextromethorphan-guaiFENesin [facility] DM MAXIMUM STRENGTH) 60-1200 MG TB12 Take 1 tablet by mouth in the morning and at bedtime. 08/13/19   Molpus, John, MD  metFORMIN (GLUCOPHAGE) 500 MG tablet Take 1 tablet (500 mg total) by mouth 2 (two) times daily with a meal. 01/05/16   Theodis Blaze, MD  ondansetron (ZOFRAN ODT) 8 MG disintegrating tablet Take 1 tablet (8 mg total) by mouth every 8 (eight) hours as needed for nausea or vomiting. 08/13/19   Molpus, Jenny Reichmann, MD    Allergies    Patient has no known allergies.  Review of Systems   Review of Systems  Constitutional: Negative for fever.  Eyes: Positive for discharge and itching. Negative for blurred vision.    Physical Exam Updated Vital Signs BP (!) 142/96   Pulse 88   Temp 98.7 F (37.1 C) (Oral)   Resp 18   Ht 1.727 m (_0 )   Wt 89.4 kg   SpO2 100%   BMI 29.95 kg/m   Physical Exam CONSTITUTIONAL: Well developed/well nourished HEAD: Normocephalic/atraumatic EYES: EOMI/PERRL, bilateral conjunctival erythema.  Small amount of discharge noted.  No proptosis.  No corneal abrasions are noted ENMT: Mucous membranes moist NECK:  supple no meningeal signs LUNGS: no apparent distress ABDOMEN: soft NEURO: Pt is awake/alert/appropriate, moves all extremitiesx4.  No facial droop.   EXTREMITIES: full ROM SKIN: warm, color normal PSYCH: no abnormalities of mood noted, alert and oriented to situation    ED Results / Procedures / Treatments   Labs (all labs ordered are listed, but only abnormal results are displayed) Labs Reviewed - No data to display  EKG None  Radiology No results found.  Procedures Procedures   Medications Ordered in ED Medications  erythromycin ophthalmic ointment ( Both Eyes Given 03/03/20 0050)  tetracaine (PONTOCAINE) 0.5 % ophthalmic solution 2 drop (  Both Eyes Given 03/03/20 0008)  fluorescein ophthalmic strip 1 strip ( Both Eyes Given 03/03/20 0009)    ED Course  I have reviewed the triage vital signs and the nursing notes.    MDM Rules/Calculators/A&P                          Patient very well-appearing.  No visual deficits.  No corneal abrasions or ulcers noted Will treat for bacterial conjunctivitis Final Clinical Impression(s) / ED Diagnoses Final diagnoses:  Acute bacterial conjunctivitis of both eyes    Rx / DC Orders ED Discharge Orders    None       Ripley Fraise, MD 03/03/20 0106

## 2020-03-07 ENCOUNTER — Encounter (HOSPITAL_BASED_OUTPATIENT_CLINIC_OR_DEPARTMENT_OTHER): Payer: Self-pay | Admitting: Emergency Medicine

## 2020-03-07 ENCOUNTER — Other Ambulatory Visit: Payer: Self-pay

## 2020-03-07 DIAGNOSIS — E119 Type 2 diabetes mellitus without complications: Secondary | ICD-10-CM | POA: Insufficient documentation

## 2020-03-07 DIAGNOSIS — H1033 Unspecified acute conjunctivitis, bilateral: Secondary | ICD-10-CM | POA: Insufficient documentation

## 2020-03-07 DIAGNOSIS — F17290 Nicotine dependence, other tobacco product, uncomplicated: Secondary | ICD-10-CM | POA: Insufficient documentation

## 2020-03-07 DIAGNOSIS — Z7984 Long term (current) use of oral hypoglycemic drugs: Secondary | ICD-10-CM | POA: Insufficient documentation

## 2020-03-07 NOTE — ED Triage Notes (Signed)
Patient presents with complaints of bilateral eye irritation and redness and swelling; seen here 5 days ago for same; states continuing to use prescribed ointment. Denies drainage at this time.

## 2020-03-08 ENCOUNTER — Emergency Department (HOSPITAL_BASED_OUTPATIENT_CLINIC_OR_DEPARTMENT_OTHER)
Admission: EM | Admit: 2020-03-08 | Discharge: 2020-03-08 | Disposition: A | Discharge status: Home / Self Care | Payer: Self-pay | Attending: Emergency Medicine | Admitting: Emergency Medicine

## 2020-03-08 DIAGNOSIS — H1033 Unspecified acute conjunctivitis, bilateral: Secondary | ICD-10-CM

## 2020-03-08 MED ORDER — OFLOXACIN 0.3 % OP SOLN
1.0000 [drp] | Freq: Four times a day (QID) | OPHTHALMIC | Status: DC
  Administered 2020-03-08: 1 [drp] via OPHTHALMIC

## 2020-03-08 NOTE — ED Provider Notes (Signed)
[facility] DE[facility] Provider Note: Georgena Spurling, MD, FACEP  CSN: [id number] MRN: [id number] ARRIVAL: 03/07/20 at 2311 ROOM: [facility]   HISTORY OF PRESENT ILLNESS  03/08/20 1:22 AM Rodney Powell is a 44 y.o. male of bilateral eye redness and irritation.  He was seen on 03/02/2020 and treated with erythromycin ointment.  He has had some improvement but some symptoms persist.  Symptoms are currently mild to moderate. He was having photophobia but that has resolved.  He works in [employer], research (medical) and is not exposed to ultraviolet light.   Past Medical History:  Diagnosis Date  . Back pain   . Diabetes [facility])     Past Surgical History:  Procedure Laterality Date  . INCISION AND DRAINAGE PERIRECTAL ABSCESS Right 01/04/2016   Procedure: IRRIGATION AND DEBRIDEMENT RIGHT BUTTOCKS  ABSCESS;  Surgeon: Armandina Gemma, MD;  Location: [facility];  Service: General;  Laterality: Right;    Family History  Problem Relation Age of Onset  . Diabetes Mother   . Diabetes Brother   . Diabetes Maternal Grandmother     Social History   Tobacco Use  . Smoking status: Current Every Day Smoker    Packs/day: 1.00    Years: 22.00    Pack years: 22.00    Types: Cigars  . Smokeless tobacco: Never Used  Vaping Use  . Vaping Use: Never used  Substance Use Topics  . Alcohol use: Not Currently  . Drug use: No    Prior to Admission medications   Medication Sig Start Date End Date Taking? Authorizing Provider  blood glucose meter kit and supplies KIT Dispense based on patient and insurance preference. Use up to four times daily as directed. (FOR ICD-9 250.00, 250.01). 01/05/16   Theodis Blaze, MD  metFORMIN (GLUCOPHAGE) 500 MG tablet Take 1 tablet (500 mg total) by mouth 2 (two) times daily with a meal. 01/05/16 03/08/20  Theodis Blaze, MD    Allergies Patient has no known allergies.   REVIEW OF SYSTEMS  Negative except as noted here or in the History of Present  Illness.   PHYSICAL EXAMINATION  Initial Vital Signs Blood pressure (!) 143/97, pulse 88, temperature 99.2 F (37.3 C), temperature source Oral, resp. rate 18, height _0  (1.727 m), weight 89.4 kg, SpO2 98 %.  Examination General: Well-developed, well-nourished male in no acute distress; appearance consistent with age of record HENT: normocephalic; atraumatic Eyes: pupils equal, round and reactive to light; extraocular muscles intact; mild conjunctival injection bilaterally without exudate Neck: supple Heart: regular rate and rhythm Lungs: clear to auscultation bilaterally Abdomen: soft; nondistended; nontender; bowel sounds present Extremities: No deformity; full range of motion Neurologic: Awake, alert and oriented; motor function intact in all extremities and symmetric; no facial droop Skin: Warm and dry Psychiatric: Normal mood and affect   RESULTS  Summary of this visit's results, reviewed and interpreted by myself:   EKG Interpretation  Date/Time:    Ventricular Rate:    PR Interval:    QRS Duration:   QT Interval:    QTC Calculation:   R Axis:     Text Interpretation:        Laboratory Studies: No results found for this or any previous visit (from the past 24 hour(s)). Imaging Studies: No results found.  ED COURSE and MDM  Nursing notes, initial and subsequent vitals signs, including pulse oximetry, reviewed and interpreted by myself.  Vitals:   03/07/20 2340 03/07/20 2343  BP:  (!) 143/97  Pulse:  88  Resp:  18  Temp:  99.2 F (37.3 C)  TempSrc:  Oral  SpO2:  98%  Weight: 89.4 kg   Height: _0  (1.727 m)    Medications  ofloxacin (OCUFLOX) 0.3 % ophthalmic solution 1 drop (has no administration in time range)    We will switch from erythromycin ointment to fluoroquinolone drops and refer to ophthalmology.  PROCEDURES  Procedures   ED DIAGNOSES     ICD-10-CM   1. Acute conjunctivitis of both eyes, unspecified acute conjunctivitis type   H10.33        Kiron Osmun, Jenny Reichmann, MD 03/08/20 [phone]

## 2020-05-06 ENCOUNTER — Ambulatory Visit (INDEPENDENT_AMBULATORY_CARE_PROVIDER_SITE_OTHER): Payer: Self-pay | Admitting: Family Medicine

## 2020-05-06 ENCOUNTER — Other Ambulatory Visit: Payer: Self-pay

## 2020-05-06 DIAGNOSIS — M545 Low back pain, unspecified: Secondary | ICD-10-CM

## 2020-05-06 DIAGNOSIS — G8929 Other chronic pain: Secondary | ICD-10-CM

## 2020-05-06 NOTE — Progress Notes (Signed)
Office Visit Note   Patient: Rodney Powell           Date of Birth: 1975-11-27           MRN: [id number] Visit Date: 05/06/2020 Requested by: No referring provider defined for this encounter. PCP: Patient, No Pcp Per  Subjective: Chief Complaint  Patient presents with  . Lower Back - Pain    Has intermittent pain in the lower back x 8 years, after a slip & fall. Back then, he had N/T in the legs - went to PT and it helped. 2 years ago while at work, he heard/felt a pop in the lower back - excruciating pain after this. Had 6 days of PT and was released to work. Pain since then and worsening. Has had episodes of shooting pains down the legs when he first stands up and legs give way.    HPI: He is here with low back and bilateral leg pain.  Intermittent pain over the past 8 years after slipping and falling.  He went to physical therapy initially and it helped, but then 2 years ago while at work he felt and heard something pop in his lower back and had excruciating pain.  He went to physical therapy for 6 months and then was released to work again.  Since then it has gotten progressively worse, he has missed multiple days of work because of his pains and he recently lost his job.  He states that 3 or 4 days/week when he stands up, his legs give way causing him to fall.  Denies bowel or bladder dysfunction.  He had x-rays taken recently but I do not have those available for review.                ROS:   All other systems were reviewed and are negative.  Objective: Vital Signs: There were no vitals taken for this visit.  Physical Exam:  General:  Alert and oriented, in no acute distress. Pulm:  Breathing unlabored. Psy:  Normal mood, congruent affect.  Low back: He has some tenderness to palpation in the midline over the L5-S1 level.  Straight leg raise is equivocal, lower extremity strength and reflexes are normal.   Imaging: No results found.  Assessment & Plan: 1.  Chronic low  back pain with bilateral giving way of legs, question lumbar stenosis. -We will refer him for MRI scan to further evaluate.  Based on results, could contemplate surgical consult or possibly epidural injection.     Procedures: No procedures performed        PMFS History: Patient Active Problem List   Diagnosis Date Noted  . Abscess of right buttock 01/03/2016  . Elevated blood sugar 01/03/2016  . History of chest pain 01/03/2016   Past Medical History:  Diagnosis Date  . Back pain   . Diabetes (HCC)     Family History  Problem Relation Age of Onset  . Diabetes Mother   . Diabetes Brother   . Diabetes Maternal Grandmother     Past Surgical History:  Procedure Laterality Date  . INCISION AND DRAINAGE PERIRECTAL ABSCESS Right 01/04/2016   Procedure: IRRIGATION AND DEBRIDEMENT RIGHT BUTTOCKS  ABSCESS;  Surgeon: Darnell Level, MD;  Location: [facility];  Service: General;  Laterality: Right;   Social History   Occupational History  . Not on file  Tobacco Use  . Smoking status: Current Every Day Smoker    Packs/day: 1.00    Years: 22.00  Pack years: 22.00    Types: Cigars  . Smokeless tobacco: Never Used  Vaping Use  . Vaping Use: Never used  Substance and Sexual Activity  . Alcohol use: Not Currently  . Drug use: No  . Sexual activity: Not on file

## 2020-06-03 ENCOUNTER — Emergency Department (HOSPITAL_COMMUNITY): Admit: 2020-06-03 | Payer: Self-pay | Source: Home / Self Care

## 2020-06-03 ENCOUNTER — Ambulatory Visit (HOSPITAL_COMMUNITY)
Admission: RE | Admit: 2020-06-03 | Discharge: 2020-06-03 | Disposition: A | Discharge status: Home / Self Care | Payer: Self-pay | Source: Ambulatory Visit | Attending: Family Medicine | Admitting: Family Medicine

## 2020-06-03 ENCOUNTER — Other Ambulatory Visit: Payer: Self-pay

## 2020-06-03 DIAGNOSIS — G8929 Other chronic pain: Secondary | ICD-10-CM | POA: Insufficient documentation

## 2020-06-03 DIAGNOSIS — M545 Low back pain, unspecified: Secondary | ICD-10-CM | POA: Insufficient documentation

## 2020-06-06 ENCOUNTER — Telehealth: Payer: Self-pay | Admitting: Family Medicine

## 2020-06-06 NOTE — Telephone Encounter (Signed)
MRI shows severe narrowing of the nerve openings at L3-4 due to shifting of the vertebrae and a disc protrusion.  There is another disc bulge at L5-S1, but it does not cause nerve impingement.  Treatment options include any of the following:  - Try physical therapy again. - Referral for an epidural steroid injection. - Referral to spine surgeon.

## 2020-06-06 NOTE — Telephone Encounter (Signed)
Left voice mail to call me back

## 2020-06-07 NOTE — Telephone Encounter (Signed)
Pt called back for his results and advised him of the message below, he states he's not sure which treatment option he would like to try so he would like Dr. Prince Rome to make a recommendation. Pt would like a CB to discuss further.   [phone]

## 2020-06-07 NOTE — Telephone Encounter (Signed)
Lmom #2 for pt to call us back to get his results

## 2020-06-07 NOTE — Telephone Encounter (Signed)
I called and made him an appt with Dr. Prince Rome on 06/13/20 @ 340pm, to review MRI and discuss options

## 2020-06-13 ENCOUNTER — Other Ambulatory Visit: Payer: Self-pay

## 2020-06-13 ENCOUNTER — Ambulatory Visit (INDEPENDENT_AMBULATORY_CARE_PROVIDER_SITE_OTHER): Payer: Self-pay | Admitting: Family Medicine

## 2020-06-13 DIAGNOSIS — G8929 Other chronic pain: Secondary | ICD-10-CM

## 2020-06-13 DIAGNOSIS — M545 Low back pain, unspecified: Secondary | ICD-10-CM

## 2020-06-13 NOTE — Progress Notes (Signed)
   Office Visit Note   Patient: Rodney Powell           Date of Birth: [birth year]           MRN: [id number] Visit Date: 06/13/2020 Requested by: Lavada Mesi, MD [address] [place],  [place] [address] PCP: Lavada Mesi, MD  Subjective: Chief Complaint  Patient presents with  . Lower Back - Follow-up, Pain    MRI review and discuss treatment options. No change in symptoms since last office visit. Some days are better than others, though.    HPI: He is here to discuss MRI results.  No change in his symptoms.              ROS:   All other systems were reviewed and are negative.  Objective: Vital Signs: There were no vitals taken for this visit.  Physical Exam:  General:  Alert and oriented, in no acute distress. Pulm:  Breathing unlabored. Psy:  Normal mood, congruent affect.  No exam done today.  Imaging: MRI was reviewed in detail on computer with the patient.  Primary finding is L3-4 anterolisthesis with disc protrusion resulting in moderate to severe by foraminal narrowing.   Assessment & Plan: 1.  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 Alvester Morin for epidural steroid injection.  If this gives only partial relief, then we will try chiropractic.  If that does not help, then potentially surgical consult.     Procedures: No procedures performed        PMFS History: Patient Active Problem List   Diagnosis Date Noted  . Abscess of right buttock 01/03/2016  . Elevated blood sugar 01/03/2016  . History of chest pain 01/03/2016   Past Medical History:  Diagnosis Date  . Back pain   . Diabetes (HCC)     Family History  Problem Relation Age of Onset  . Diabetes Mother   . Diabetes Brother   . Diabetes Maternal Grandmother     Past Surgical History:  Procedure Laterality Date  . INCISION AND DRAINAGE PERIRECTAL ABSCESS Right 01/04/2016   Procedure: IRRIGATION AND DEBRIDEMENT RIGHT BUTTOCKS  ABSCESS;   Surgeon: Darnell Level, MD;  Location: [facility];  Service: General;  Laterality: Right;   Social History   Occupational History  . Not on file  Tobacco Use  . Smoking status: Current Every Day Smoker    Packs/day: 1.00    Years: 22.00    Pack years: 22.00    Types: Cigars  . Smokeless tobacco: Never Used  Vaping Use  . Vaping Use: Never used  Substance and Sexual Activity  . Alcohol use: Not Currently  . Drug use: No  . Sexual activity: Not on file

## 2021-07-23 IMAGING — MR MR LUMBAR SPINE W/O CM
4 of 5 series · 19 of 48 positions shown · non-contrast
Comparison: 01/03/2016.

CLINICAL DATA: Lumbar radiculopathy, no red flags

EXAM:
MRI LUMBAR SPINE WITHOUT CONTRAST
TECHNIQUE: Multiplanar, multisequence MR imaging of the lumbar spine was
performed. No intravenous contrast was administered.

[Series 3: T2 · sagittal · 4.0mm · 0.55mm/px · 6 of 14 slices shown (1 of 2)]
[im 1/14]
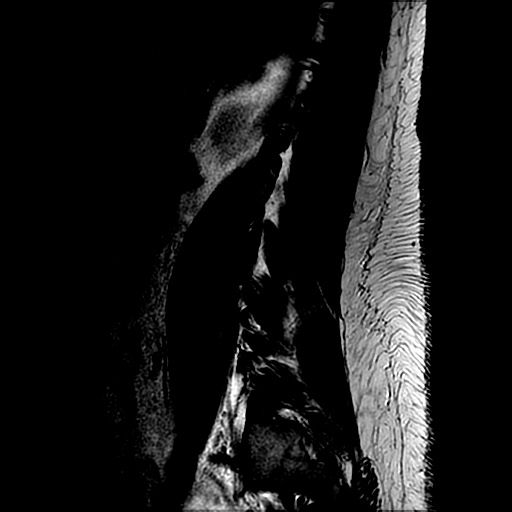
[im 3/14]
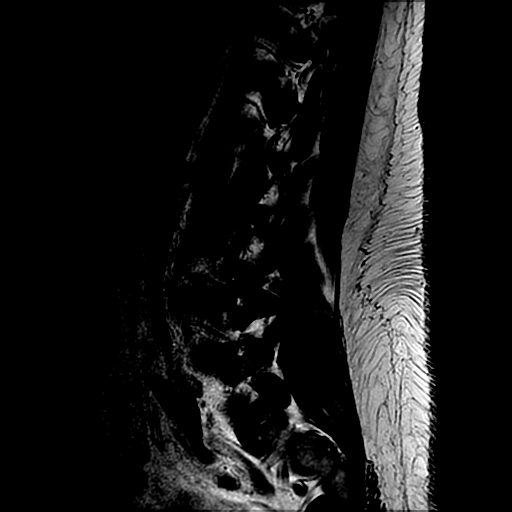
[im 6/14]
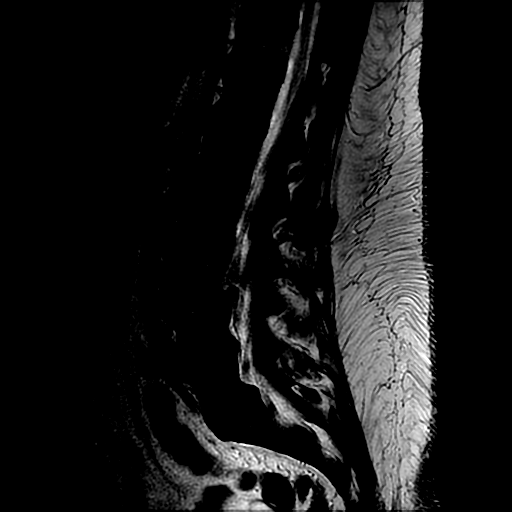
[im 8/14]
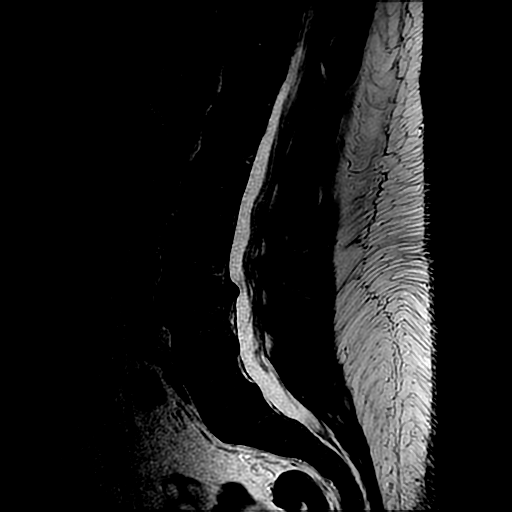
[im 11/14]
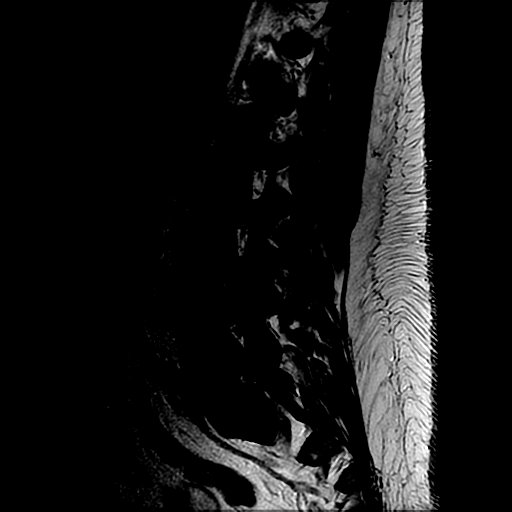
[im 14/14]
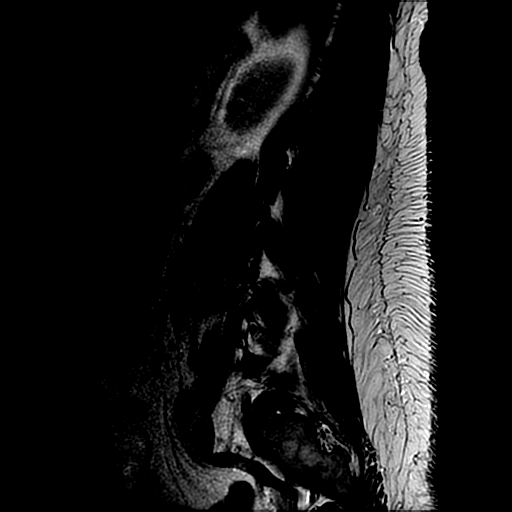

[Series 4: T1 · sagittal · 4.0mm · 0.55mm/px · 3 of 14 slices shown (1 of 2)]
[im 3/14]
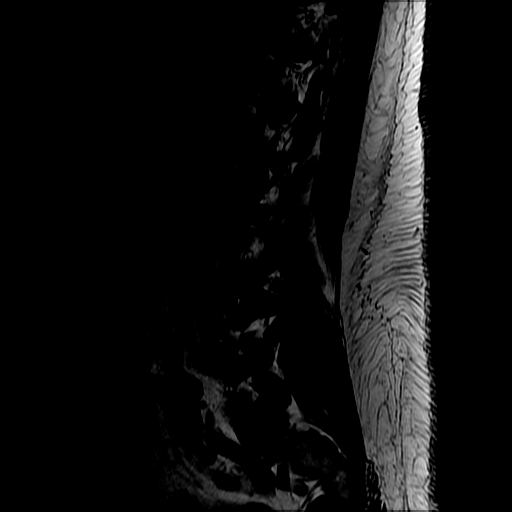
[im 8/14]
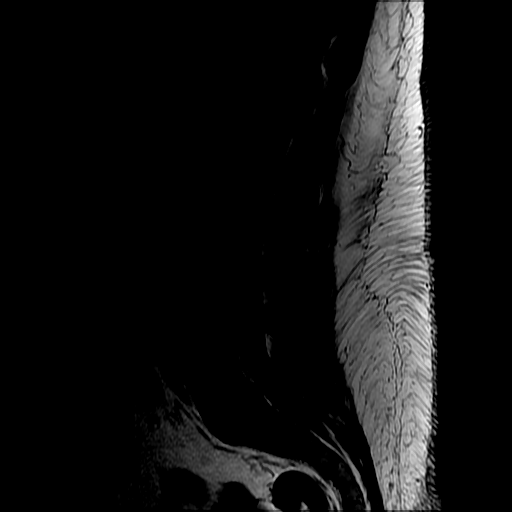
[im 14/14]
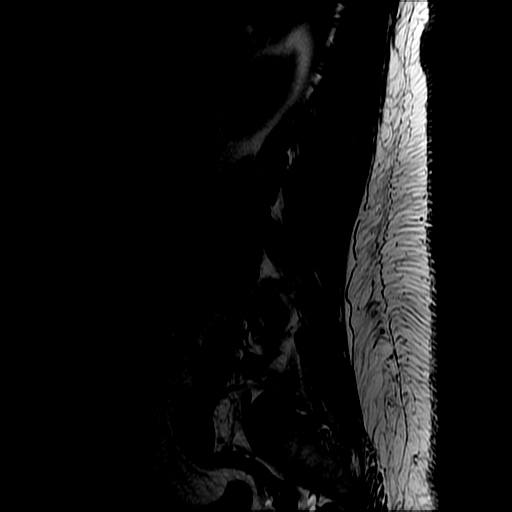

[Series 6: T2 · axial · 4.0mm · 0.39mm/px · z∈[-78,+87]mm · 7 of 33 slices shown (2 of 2)]
[im 1/33]
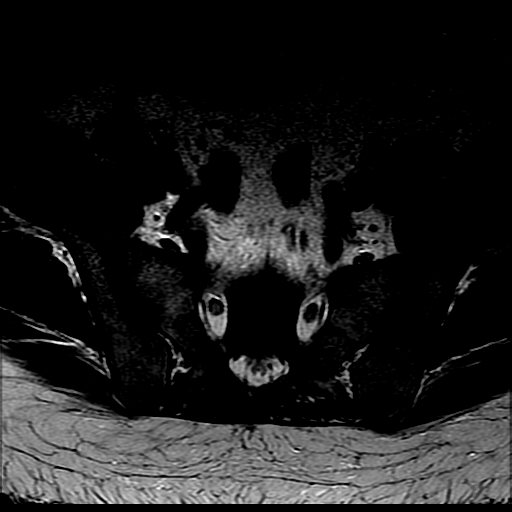
[im 5/33]
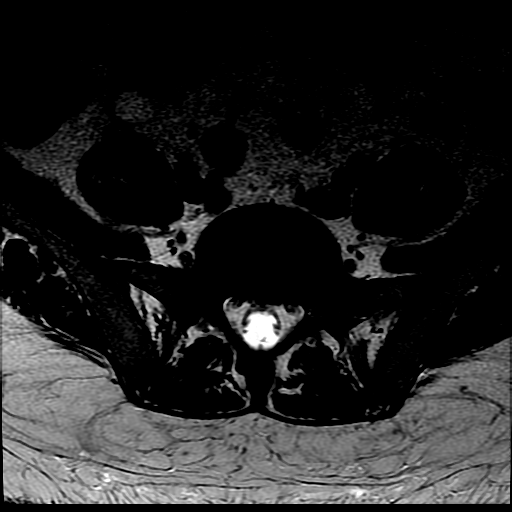
[im 10/33]
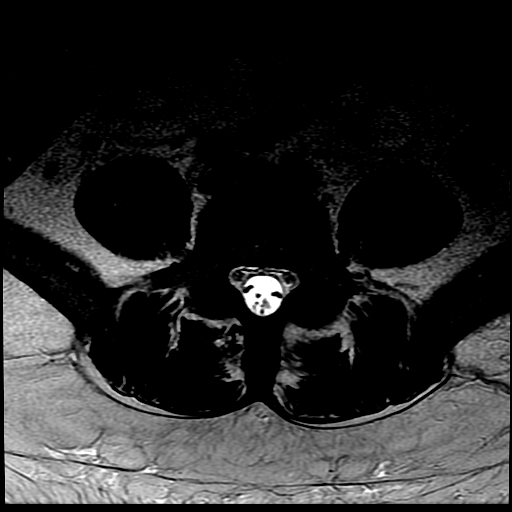
[im 14/33]
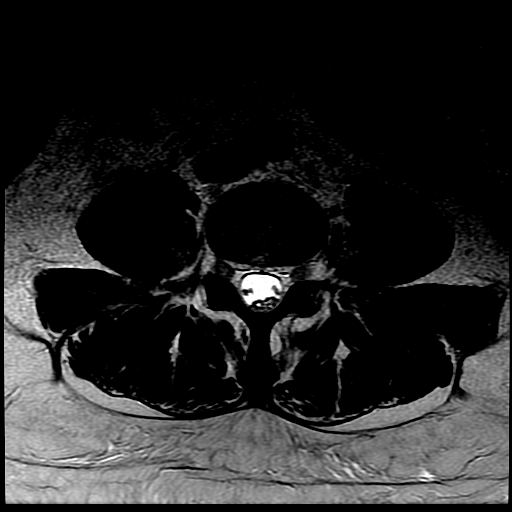
[im 17/33]
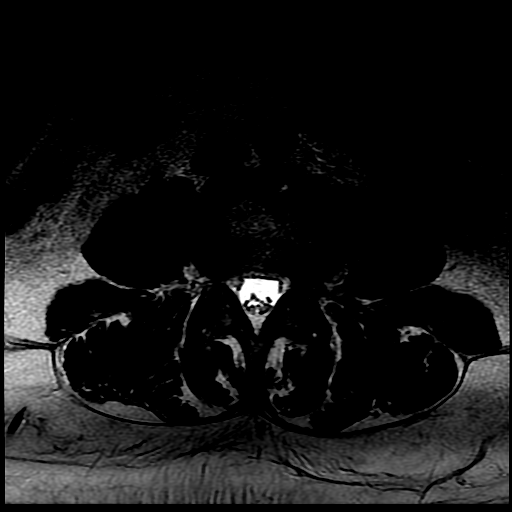
[im 19/33]
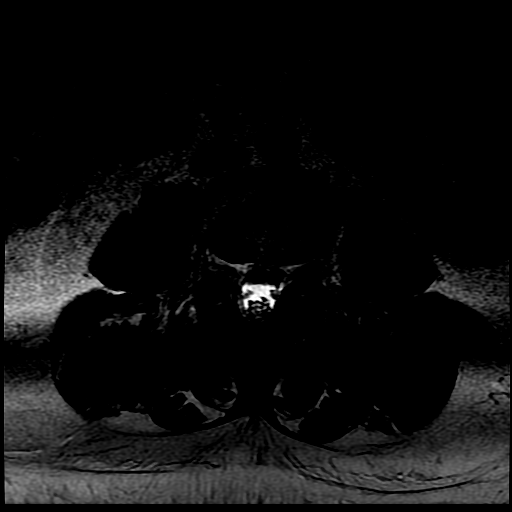
[im 28/33]
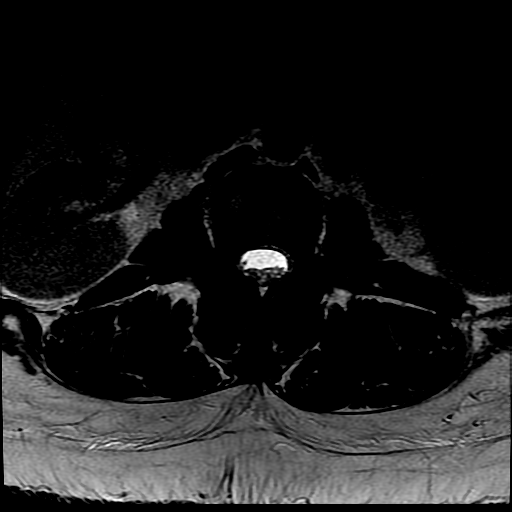

[Series 7: T1 · axial · 4.0mm · 0.39mm/px · z∈[-59,+87]mm · 3 of 33 slices shown (2 of 2)]
[im 5/33]
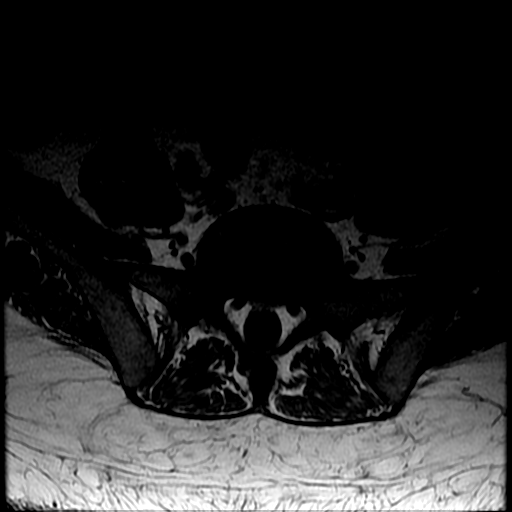
[im 17/33]
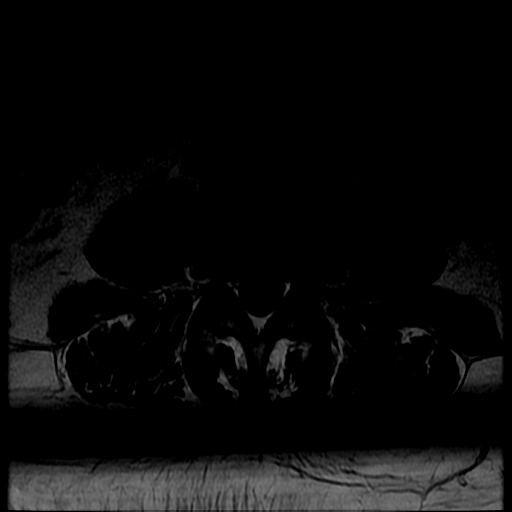
[im 28/33]
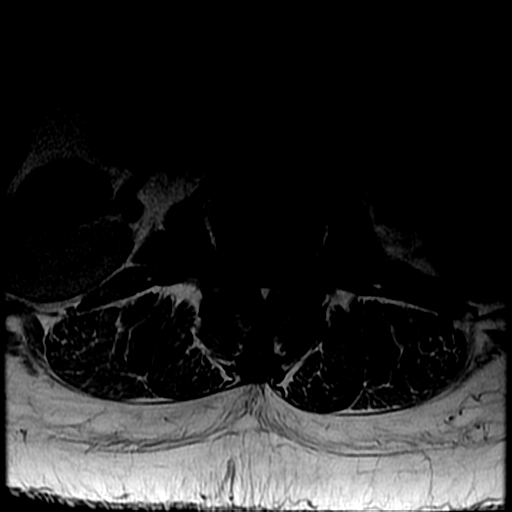

[19 of 48 positions shown; findings below may reference images not displayed]

FINDINGS: Segmentation:  Standard.

Alignment: Bilateral L2-3, L3-4 pars interarticularis defects. Grade
1 L3-4 anterolisthesis.

Vertebrae: Vertebral body heights are preserved. Modic type 2
endplate degenerative changes most prominent at the L3-4 level. No
aggressive osseous lesion.

Conus medullaris and cauda equina: Conus extends to the L1 level.
Conus and cauda equina appear normal.

Disc levels: Multilevel desiccation and disc space loss most
prominent at L3-4, L4-5.

L1-2: Bilateral facet degenerative spurring. No significant disc
bulge. Patent spinal canal and neural foramen.

L2-3: No significant disc bulge. Patent spinal canal and neural
foramen.

L3-4: Grade 1 anterolisthesis with uncovered bulge. Superimposed
left foraminal/extraforaminal protrusion. Facet degenerative
spurring. Mild spinal canal and moderate to severe bilateral neural
foraminal narrowing.

L4-5: Mild disc bulge and bilateral facet degenerative spurring.
Patent spinal canal. Mild bilateral neural foraminal narrowing.

L5-S1: Disc bulge with shallow left foraminal protrusion. Bilateral
facet hypertrophy. Patent spinal canal. Mild bilateral neural
foraminal narrowing.

Paraspinal and other soft tissues: Negative.
IMPRESSION: Bilateral L2-3, L3-4 pars defects with grade 1 anterolisthesis at
the L3-4 level.

Mild spinal canal and moderate to severe bilateral neural foraminal
narrowing at L3-4.

Mild bilateral L4-5, L5-S1 neural foraminal narrowing.

## 2021-09-22 ENCOUNTER — Ambulatory Visit: Payer: Self-pay | Admitting: Family Medicine

## 2021-09-25 ENCOUNTER — Encounter: Payer: Self-pay | Admitting: Family Medicine

## 2021-09-25 ENCOUNTER — Ambulatory Visit (INDEPENDENT_AMBULATORY_CARE_PROVIDER_SITE_OTHER): Payer: 59 | Admitting: Family Medicine

## 2021-09-25 DIAGNOSIS — M5416 Radiculopathy, lumbar region: Secondary | ICD-10-CM | POA: Insufficient documentation

## 2021-09-25 NOTE — Progress Notes (Signed)
   Rodney Powell is a 46 y.o. male who presents to [facility] today for the following:  Back Pain Reports that 11 years ago he slipped and fell onto his back, reports he "ruptured his back".  This caused him to miss work for 1 month, also did PT for 6 months after this.  States that since then the pain was manageable and intermittent.  He then reinjured his back 3 years ago at work, reports twisting the wrong way.  He reports hearing a "snap".  Since then he has been having constant low back pain with radiation down his right leg.  Reports he had an MRI last year ordered by his PCP.  The pain is becoming more frequent, sometimes makes it hard to walk.  It is not waking up from sleep.  He has tried muscle relaxers and lidocaine patch for this.  Changing positions helps to alleviate the pain on occasion. Previously discussed with Dr. Prince Rome regarding back injections but he did not have insurance at the time.  He is not interested in any temporary fixes, would like a permanent fix.  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 Mild spinal canal and moderate to severe bilateral neural foraminal narrowing at L3-4. Mild bilateral L4-5, L5-S1 neural foraminal narrowing.  PMH reviewed.  ROS as above. Medications reviewed.  Exam:  BP 140/82 (BP Location: Left Arm, Patient Position: Sitting, Cuff Size: Normal)   Ht 5\' 8"  (1.727 m)   Wt 240 lb (108.9 kg)   BMI 36.49 kg/m  Gen: Well NAD MSK: Back Exam:  Inspection: Unremarkable  Palpable tenderness: TTP SI joint R>L.  No midline bony tenderness. Range of Motion:  Flexion 30 deg; Extension 45 deg; Side Bending to 45 deg bilaterally; Rotation to 45 deg bilaterally  Leg strength: Able to lift up against gravity, limited assessment 2/2 patient discomfort Sensory change: Gross sensation intact to all lumbar and sacral dermatomes.  Reflexes: Trace patellar and achilles reflexes b/l Gait unremarkable. Right  SLR laying: Negative   Assessment and Plan: 1) Lumbar radiculopathy, chronic Chronic problem for patient. MRI from 05/2020 with moderate to severe b/l neural foraminal narrowing at L3-4. No new injury or change in pain to warrant repeat imaging. No red flags concerning for cauda equina. Discussed all options with patient including PT, medrol Dosepak, meloxicam/voltaren gel, gabapentin, neurosurgery referral. Patient opted against anything that would only give him temporary relief, he is more interested in permanent fix, understandably.  -Neurosurgery referral to discuss options and see if surgical candidate -Can continue Lidocaine patch PRN   06/2020 PGY-2 Family Medicine

## 2021-09-25 NOTE — Patient Instructions (Addendum)
You have lumbar radiculopathy (a pinched nerve in your low back). We will refer you to neurosurgery - if you haven't heard anything in the next two weeks give [place] a call. Ok to take tylenol for baseline pain relief (1-2 extra strength tabs 3x/day), use the lidocaine patches, take aleve 2 tabs twice a day with food. Consider prednisone dose pack. Stay as active as possible.  [facility] Address: [address] [place], [place] [address] Phone: [phone]

## 2021-09-25 NOTE — Assessment & Plan Note (Addendum)
Chronic problem for patient. MRI from 05/2020 with moderate to severe b/l neural foraminal narrowing at L3-4. No new injury or change in pain to warrant repeat imaging. No red flags concerning for cauda equina. Discussed all options with patient including PT, medrol Dosepak, meloxicam/voltaren gel, gabapentin, neurosurgery referral. Patient opted against anything that would only give him temporary relief, he is more interested in permanent fix, understandably.  -Neurosurgery referral to discuss options and see if surgical candidate -Can continue Lidocaine patch PRN

## 2021-12-19 ENCOUNTER — Encounter: Payer: Self-pay | Admitting: Nurse Practitioner

## 2021-12-19 ENCOUNTER — Ambulatory Visit: Payer: 59 | Attending: Nurse Practitioner | Admitting: Nurse Practitioner

## 2021-12-19 ENCOUNTER — Ambulatory Visit: Payer: 59 | Admitting: Nurse Practitioner

## 2021-12-19 VITALS — BP 171/104 | HR 98 | Temp 98.1°F | Ht 68.0 in | Wt 238.4 lb

## 2021-12-19 DIAGNOSIS — R002 Palpitations: Secondary | ICD-10-CM | POA: Diagnosis not present

## 2021-12-19 DIAGNOSIS — Z1159 Encounter for screening for other viral diseases: Secondary | ICD-10-CM

## 2021-12-19 DIAGNOSIS — E1165 Type 2 diabetes mellitus with hyperglycemia: Secondary | ICD-10-CM | POA: Diagnosis not present

## 2021-12-19 DIAGNOSIS — R69 Illness, unspecified: Secondary | ICD-10-CM | POA: Diagnosis not present

## 2021-12-19 DIAGNOSIS — G8929 Other chronic pain: Secondary | ICD-10-CM | POA: Diagnosis not present

## 2021-12-19 DIAGNOSIS — M545 Low back pain, unspecified: Secondary | ICD-10-CM

## 2021-12-19 DIAGNOSIS — E785 Hyperlipidemia, unspecified: Secondary | ICD-10-CM

## 2021-12-19 DIAGNOSIS — Z130 Encounter for screening for diseases of the blood and blood-forming organs and certain disorders involving the immune mechanism: Secondary | ICD-10-CM

## 2021-12-19 DIAGNOSIS — Z114 Encounter for screening for human immunodeficiency virus [HIV]: Secondary | ICD-10-CM

## 2021-12-19 DIAGNOSIS — Z1211 Encounter for screening for malignant neoplasm of colon: Secondary | ICD-10-CM

## 2021-12-19 DIAGNOSIS — I10 Essential (primary) hypertension: Secondary | ICD-10-CM

## 2021-12-19 MED ORDER — GABAPENTIN 600 MG PO TABS: 300.0000 mg | ORAL_TABLET | Freq: Three times a day (TID) | ORAL | 1 refills | Status: DC

## 2021-12-19 MED ORDER — VALSARTAN 40 MG PO TABS: 40.0000 mg | ORAL_TABLET | Freq: Every day | ORAL | 3 refills | Status: DC

## 2021-12-19 NOTE — Progress Notes (Signed)
Assessment & Plan:  Rodney Powell was seen today for establish care.  Diagnoses and all orders for this visit:  Type 2 diabetes mellitus with hyperglycemia, without long-term current use of insulin (HCC) -     Microalbumin / creatinine urine ratio -     Hemoglobin A1c -     CMP14+EGFR  Primary hypertension -     valsartan (DIOVAN) 40 MG tablet; Take 1 tablet (40 mg total) by mouth daily.  Dyslipidemia, goal LDL below 70 -     Lipid panel -     atorvastatin (LIPITOR) 40 MG tablet; Take 1 tablet (40 mg total) by mouth daily.  Chronic bilateral low back pain without sciatica -     gabapentin (NEURONTIN) 600 MG tablet; Take 0.5 tablets (300 mg total) by mouth 3 (three) times daily. For back pain Work on losing weight to help reduce back pain. May alternate with heat and ice application for pain relief. May also alternate with acetaminophen and Ibuprofen as prescribed for back pain. Other alternatives include massage, acupuncture and water aerobics.    Need for hepatitis C screening test -     HCV Ab w Reflex to Quant PCR -     Interpretation:  Encounter for screening for HIV -     HIV Antibody (routine testing w rflx)  Colon cancer screening -     Fecal occult blood, imunochemical([facility])  Screening for deficiency anemia -     CBC with Differential  Heart palpitations -     Thyroid Panel With TSH    Patient has been counseled on age-appropriate routine health concerns for screening and prevention. These are reviewed and up-to-date. Referrals have been placed accordingly. Immunizations are up-to-date or declined.    Subjective:   Chief Complaint  Patient presents with   Establish Care   HPI Rodney Powell 46 y.o. male presents to office today to establish care.  He has a past medical history of Back pain and Diabetes   Back Pain Chronic and gradually worsening. He states he can't sleep in the bed due to his back pain however he is sleeping on a couch which I  have instructed him will only exacerbate his low back pain. worsening. He did participate in physical therapy years ago after being injured on the job (slipped and fell). He saw ortho last year.  Per Ortho note: He went to physical therapy initially and it helped, but then 2 years ago while at work he felt and heard something pop in his lower back and had excruciating pain.  He went to physical therapy for 6 months and then was released to work again.  Since then it has gotten progressively worse, he has missed multiple days of work because of his pains and he recently lost his job.  He states that 3 or 4 days/week when he stands up, his legs give way causing him to fall.  Denies bowel or bladder dysfunction. 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 Mild spinal canal and moderate to severe bilateral neural foraminal narrowing at L3-4. Mild bilateral L4-5, L5-S1 neural foraminal narrowing. Patient opted against anything that would only give him temporary relief, he is more interested in permanent fix, understandably.  -Neurosurgery referral to discuss options and see if surgical candidate -Can continue Lidocaine patch PRN    HTN States blood pressure runs high at home as well. Similar to today's readings. Will start valsartan 40 mg today. Associated  symptoms: heart palpitations.  BP Readings from Last 3 Encounters:  12/19/21 (!) 171/104  09/25/21 140/82  03/08/20 138/74     DM Had stomach pains with taking metformin. Prefers not to take it anymore. Glucose levels "up" at home. Stopped drinking and smoking in December. Has gained weight since then due to dietary habits. Mom, brother and both grandmothers all had diabetes. LDL not at goal. Will need to start statin as well.  Lab Results  Component Value Date   HGBA1C 14.4 (H) 12/19/2021   Lab Results  Component Value Date   LDLCALC 172 (H) 12/19/2021      Review of Systems   Constitutional:  Negative for fever, malaise/fatigue and weight loss.  HENT: Negative.  Negative for nosebleeds.   Eyes: Negative.  Negative for blurred vision, double vision and photophobia.  Respiratory: Negative.  Negative for cough and shortness of breath.   Cardiovascular:  Positive for palpitations. Negative for chest pain, orthopnea, claudication, leg swelling and PND.  Gastrointestinal: Negative.  Negative for heartburn, nausea and vomiting.  Musculoskeletal:  Positive for back pain and joint pain. Negative for myalgias.  Neurological: Negative.  Negative for dizziness, focal weakness, seizures and headaches.  Psychiatric/Behavioral: Negative.  Negative for suicidal ideas.     Past Medical History:  Diagnosis Date   Back pain    Diabetes [facility])     Past Surgical History:  Procedure Laterality Date   INCISION AND DRAINAGE PERIRECTAL ABSCESS Right 01/04/2016   Procedure: IRRIGATION AND DEBRIDEMENT RIGHT BUTTOCKS  ABSCESS;  Surgeon: Armandina Gemma, MD;  Location: [facility];  Service: General;  Laterality: Right;    Family History  Problem Relation Age of Onset   Diabetes Mother    Diabetes Brother    Diabetes Maternal Grandmother     Social History Reviewed with no changes to be made today.   No outpatient medications prior to visit.   No facility-administered medications prior to visit.    No Known Allergies     Objective:    BP (!) 171/104   Pulse 98   Temp 98.1 F (36.7 C) (Oral)   Ht 5' 8"  (1.727 m)   Wt 238 lb 6.4 oz (108.1 kg)   SpO2 100%   BMI 36.25 kg/m  Wt Readings from Last 3 Encounters:  12/19/21 238 lb 6.4 oz (108.1 kg)  09/25/21 240 lb (108.9 kg)  03/07/20 197 lb 1.5 oz (89.4 kg)    Physical Exam Vitals and nursing note reviewed.  Constitutional:      Appearance: He is well-developed.  HENT:     Head: Normocephalic and atraumatic.  Cardiovascular:     Rate and Rhythm: Normal rate and regular rhythm.     Heart sounds: Normal heart sounds. No  murmur heard.    No friction rub. No gallop.  Pulmonary:     Effort: Pulmonary effort is normal. No tachypnea or respiratory distress.     Breath sounds: Normal breath sounds. No decreased breath sounds, wheezing, rhonchi or rales.  Chest:     Chest wall: No tenderness.  Abdominal:     General: Bowel sounds are normal.     Palpations: Abdomen is soft.  Musculoskeletal:        General: Normal range of motion.     Cervical back: Normal range of motion.  Skin:    General: Skin is warm and dry.  Neurological:     Mental Status: He is alert and oriented to person, place, and time.  Coordination: Coordination normal.  Psychiatric:        Behavior: Behavior normal. Behavior is cooperative.        Thought Content: Thought content normal.        Judgment: Judgment normal.          Patient has been counseled extensively about nutrition and exercise as well as the importance of adherence with medications and regular follow-up. The patient was given clear instructions to go to ER or return to medical center if symptoms don't improve, worsen or new problems develop. The patient verbalized understanding.   Follow-up: Return for 4 weeks BP check and meter check with me.Gildardo Pounds, FNP-BC [facility] [place], [place]   12/26/2021, 2:47 PM

## 2021-12-20 LAB — CMP14+EGFR
ALT: 25 IU/L (ref 0–44)
AST: 20 IU/L (ref 0–40)
Albumin/Globulin Ratio: 1.5 (ref 1.2–2.2)
Albumin: 4.1 g/dL (ref 4.1–5.1)
Alkaline Phosphatase: 194 IU/L — ABNORMAL HIGH (ref 44–121)
BUN/Creatinine Ratio: 10 (ref 9–20)
BUN: 14 mg/dL (ref 6–24)
Bilirubin Total: 0.3 mg/dL (ref 0.0–1.2)
CO2: 22 mmol/L (ref 20–29)
Calcium: 8.8 mg/dL (ref 8.7–10.2)
Chloride: 97 mmol/L (ref 96–106)
Creatinine, Ser: 1.36 mg/dL — ABNORMAL HIGH (ref 0.76–1.27)
Globulin, Total: 2.7 g/dL (ref 1.5–4.5)
Glucose: 396 mg/dL — ABNORMAL HIGH (ref 70–99)
Potassium: 5.4 mmol/L — ABNORMAL HIGH (ref 3.5–5.2)
Sodium: 133 mmol/L — ABNORMAL LOW (ref 134–144)
Total Protein: 6.8 g/dL (ref 6.0–8.5)
eGFR: 65 mL/min/{1.73_m2} (ref >59)

## 2021-12-20 LAB — HCV AB W REFLEX TO QUANT PCR: HCV Ab: NONREACTIVE

## 2021-12-20 LAB — MICROALBUMIN / CREATININE URINE RATIO
Creatinine, Urine: 95 mg/dL
Microalb/Creat Ratio: 777 mg/g creat — ABNORMAL HIGH (ref 0–29)
Microalbumin, Urine: 738 ug/mL

## 2021-12-20 LAB — LIPID PANEL
Chol/HDL Ratio: 6.2 ratio — ABNORMAL HIGH (ref 0.0–5.0)
Cholesterol, Total: 231 mg/dL — ABNORMAL HIGH (ref 100–199)
HDL: 37 mg/dL — ABNORMAL LOW (ref >39)
LDL Chol Calc (NIH): 172 mg/dL — ABNORMAL HIGH (ref 0–99)
Triglycerides: 122 mg/dL (ref 0–149)
VLDL Cholesterol Cal: 22 mg/dL (ref 5–40)

## 2021-12-20 LAB — CBC WITH DIFFERENTIAL/PLATELET
Basophils Absolute: 0 10*3/uL (ref 0.0–0.2)
Basos: 1 %
EOS (ABSOLUTE): 0.1 10*3/uL (ref 0.0–0.4)
Eos: 2 %
Hematocrit: 39.9 % (ref 37.5–51.0)
Hemoglobin: 13.3 g/dL (ref 13.0–17.7)
Immature Grans (Abs): 0 10*3/uL (ref 0.0–0.1)
Immature Granulocytes: 1 %
Lymphocytes Absolute: 1.9 10*3/uL (ref 0.7–3.1)
Lymphs: 48 %
MCH: 27.9 pg (ref 26.6–33.0)
MCHC: 33.3 g/dL (ref 31.5–35.7)
MCV: 84 fL (ref 79–97)
Monocytes Absolute: 0.3 10*3/uL (ref 0.1–0.9)
Monocytes: 9 %
Neutrophils Absolute: 1.5 10*3/uL (ref 1.4–7.0)
Neutrophils: 39 %
Platelets: 195 10*3/uL (ref 150–450)
RBC: 4.77 x10E6/uL (ref 4.14–5.80)
RDW: 12.3 % (ref 11.6–15.4)
WBC: 3.8 10*3/uL (ref 3.4–10.8)

## 2021-12-20 LAB — THYROID PANEL WITH TSH
Free Thyroxine Index: 1.9 (ref 1.2–4.9)
T3 Uptake Ratio: 29 % (ref 24–39)
T4, Total: 6.6 ug/dL (ref 4.5–12.0)
TSH: 6.1 u[IU]/mL — ABNORMAL HIGH (ref 0.450–4.500)

## 2021-12-20 LAB — HIV ANTIBODY (ROUTINE TESTING W REFLEX): HIV Screen 4th Generation wRfx: NONREACTIVE

## 2021-12-20 LAB — HCV INTERPRETATION

## 2021-12-20 LAB — HEMOGLOBIN A1C
Est. average glucose Bld gHb Est-mCnc: 367 mg/dL
Hgb A1c MFr Bld: 14.4 % — ABNORMAL HIGH (ref 4.8–5.6)

## 2021-12-25 ENCOUNTER — Other Ambulatory Visit: Payer: Self-pay | Admitting: Nurse Practitioner

## 2021-12-25 DIAGNOSIS — E1165 Type 2 diabetes mellitus with hyperglycemia: Secondary | ICD-10-CM

## 2021-12-25 MED ORDER — MISC. DEVICES MISC: 0 refills | Status: AC

## 2021-12-25 MED ORDER — GLIPIZIDE 5 MG PO TABS: 5.0000 mg | ORAL_TABLET | Freq: Two times a day (BID) | ORAL | 3 refills | Status: DC

## 2021-12-25 MED ORDER — MISC. DEVICES MISC: 0 refills | Status: DC

## 2021-12-25 MED ORDER — INSULIN DETEMIR 100 UNIT/ML FLEXPEN: 20.0000 [IU] | PEN_INJECTOR | Freq: Every day | SUBCUTANEOUS | 11 refills | Status: DC

## 2021-12-26 ENCOUNTER — Telehealth: Payer: Self-pay | Admitting: Nurse Practitioner

## 2021-12-26 ENCOUNTER — Encounter: Payer: Self-pay | Admitting: Nurse Practitioner

## 2021-12-26 MED ORDER — ATORVASTATIN CALCIUM 40 MG PO TABS: 40.0000 mg | ORAL_TABLET | Freq: Every day | ORAL | 3 refills | Status: DC

## 2021-12-26 MED ORDER — PEN NEEDLES 32G X 4 MM MISC: 2 refills | Status: DC

## 2021-12-26 NOTE — Telephone Encounter (Signed)
Rx sent 

## 2021-12-26 NOTE — Telephone Encounter (Signed)
Hui pharmacist from [employer] requesting generic brand pen needles for the insulin detemir (LEVEMIR) 100 UNIT/ML [place], [place] Phone:  [phone]  Fax:  [phone]      Patient last seen 12/19/2021.

## 2022-01-16 ENCOUNTER — Ambulatory Visit: Payer: 59 | Attending: Nurse Practitioner | Admitting: Pharmacist

## 2022-01-16 ENCOUNTER — Ambulatory Visit: Payer: 59 | Admitting: Nurse Practitioner

## 2022-01-16 ENCOUNTER — Encounter: Payer: Self-pay | Admitting: Pharmacist

## 2022-01-16 VITALS — BP 125/83 | HR 80

## 2022-01-16 DIAGNOSIS — I10 Essential (primary) hypertension: Secondary | ICD-10-CM

## 2022-01-16 DIAGNOSIS — R002 Palpitations: Secondary | ICD-10-CM

## 2022-01-16 NOTE — Progress Notes (Signed)
S:     No chief complaint on file.  Rodney Powell is a 46 y.o. male who presents for hypertension evaluation, education, and management.  PMH is significant for chronic lumber radiculopathy, chest pain, chest palpitations, T2DM, HTN.  Patient was referred and last seen by Primary Care Provider, Geryl Rankins, on 12/26/2021. BP was elevated and Ms. Army Melia started valsartan. Of note, he is returning for labs today as well. Labs showed hyperkalemia and elevated Scr secondary to worsening DM control.   Today, patient arrives in good spirits and presents without assistance. Denies dizziness, headache, blurred vision, swelling. Still in significant pain from lumbar radiculopathy.    Family/Social history:  Fhx: DM Tobacco: former smoker. Stopped ~1 year ago. Alcohol: none since Feb this year.   Medication adherence reported. Patient has taken BP medications today.   Current antihypertensives include: valsartan 40 mg daily   Antihypertensives tried in the past include: none, NKDA   Reported home BP readings:  -Checks at [place] but tells me last reading was several weeks ago. -Tells me he saw SBPs >200 mmHg when he was checking.   Patient reported dietary habits:  -Tries to limit sodium. Never adds salt to foods when cooking or at the table.   Patient-reported exercise habits:  - Limited d/t back pain  O:  Vitals:   01/16/22 1105  BP: 125/83  Pulse: 80   Last 3 Office BP readings: BP Readings from Last 3 Encounters:  01/16/22 125/83  12/19/21 (!) 171/104  09/25/21 140/82   BMET    Component Value Date/Time   NA 133 (L) 12/19/2021 0941   K 5.4 (H) 12/19/2021 0941   CL 97 12/19/2021 0941   CO2 22 12/19/2021 0941   GLUCOSE 396 (H) 12/19/2021 0941   GLUCOSE 248 (H) 08/13/2019 0055   BUN 14 12/19/2021 0941   CREATININE 1.36 (H) 12/19/2021 0941   CALCIUM 8.8 12/19/2021 0941   GFRNONAA >60 08/13/2019 0055   GFRAA >60 08/13/2019 0055    Renal function: CrCl cannot be  calculated (Patient's most recent lab result is older than the maximum 21 days allowed.).  Clinical ASCVD: No  The 10-year ASCVD risk score (Arnett DK, et al., 2019) is: 15.2%   Values used to calculate the score:     Age: 48 years     Sex: Male     Is Non-Hispanic African American: Yes     Diabetic: Yes     Tobacco smoker: No     Systolic Blood Pressure: 226 mmHg     Is BP treated: Yes     HDL Cholesterol: 37 mg/dL     Total Cholesterol: 231 mg/dL   A/P: Hypertension diagnosed currently at goal on current medications. BP goal < 130/80 mmHg. Medication adherence appears appropriate. BP seems to vary. Despite his BP being very high at last PCP visit, his reading today is at goal with valsartan 40 mg only. He had a reading of 140/82 mmHg at [facility] in June without therapy. Additionally, he had an elevated Scr and K level at last PCP visit. Will recheck this and TSH today as recommended by PCP. If K continues to be elevated, will have to d/c his ARB and initiate amlodipine. -Continued valsartan 40 mg daily for now.  -Patient educated on purpose, proper use, and potential adverse effects of valsartan.  -F/u labs ordered - CMP14+eGFR, Thyroid panel w/ TSH -Counseled on lifestyle modifications for blood pressure control including reduced dietary sodium, increased exercise, adequate sleep. -  Encouraged patient to check BP at home and bring log of readings to next visit. Counseled on proper use of home BP cuff.    Results reviewed and written information provided.    Written patient instructions provided. Patient verbalized understanding of treatment plan.  Total time in face to face counseling 30 minutes.    Follow-up:  Pharmacist in 1 month. PCP clinic visit within 1-3 months.  Benard Halsted, PharmD, [place], [place] [phone]

## 2022-01-17 LAB — THYROID PANEL WITH TSH
Free Thyroxine Index: 1.5 (ref 1.2–4.9)
T3 Uptake Ratio: 25 % (ref 24–39)
T4, Total: 5.8 ug/dL (ref 4.5–12.0)
TSH: 6.18 u[IU]/mL — ABNORMAL HIGH (ref 0.450–4.500)

## 2022-01-17 LAB — CMP14+EGFR
ALT: 99 IU/L — ABNORMAL HIGH (ref 0–44)
AST: 43 IU/L — ABNORMAL HIGH (ref 0–40)
Albumin/Globulin Ratio: 1.5 (ref 1.2–2.2)
Albumin: 3.9 g/dL — ABNORMAL LOW (ref 4.1–5.1)
Alkaline Phosphatase: 288 IU/L — ABNORMAL HIGH (ref 44–121)
BUN/Creatinine Ratio: 21 — ABNORMAL HIGH (ref 9–20)
BUN: 26 mg/dL — ABNORMAL HIGH (ref 6–24)
Bilirubin Total: 0.3 mg/dL (ref 0.0–1.2)
CO2: 25 mmol/L (ref 20–29)
Calcium: 9 mg/dL (ref 8.7–10.2)
Chloride: 103 mmol/L (ref 96–106)
Creatinine, Ser: 1.26 mg/dL (ref 0.76–1.27)
Globulin, Total: 2.6 g/dL (ref 1.5–4.5)
Glucose: 279 mg/dL — ABNORMAL HIGH (ref 70–99)
Potassium: 5.1 mmol/L (ref 3.5–5.2)
Sodium: 139 mmol/L (ref 134–144)
Total Protein: 6.5 g/dL (ref 6.0–8.5)
eGFR: 71 mL/min/{1.73_m2} (ref >59)

## 2022-01-21 ENCOUNTER — Other Ambulatory Visit: Payer: Self-pay | Admitting: Nurse Practitioner

## 2022-01-21 DIAGNOSIS — R7989 Other specified abnormal findings of blood chemistry: Secondary | ICD-10-CM

## 2022-01-21 DIAGNOSIS — R748 Abnormal levels of other serum enzymes: Secondary | ICD-10-CM

## 2022-01-29 ENCOUNTER — Ambulatory Visit: Payer: 59 | Attending: Nurse Practitioner

## 2022-01-29 DIAGNOSIS — R7989 Other specified abnormal findings of blood chemistry: Secondary | ICD-10-CM

## 2022-01-29 DIAGNOSIS — R748 Abnormal levels of other serum enzymes: Secondary | ICD-10-CM | POA: Diagnosis not present

## 2022-01-30 ENCOUNTER — Other Ambulatory Visit: Payer: Self-pay | Admitting: Nurse Practitioner

## 2022-01-30 DIAGNOSIS — E038 Other specified hypothyroidism: Secondary | ICD-10-CM

## 2022-01-30 LAB — HEPATIC FUNCTION PANEL
ALT: 36 IU/L (ref 0–44)
AST: 26 IU/L (ref 0–40)
Albumin: 3.9 g/dL — ABNORMAL LOW (ref 4.1–5.1)
Alkaline Phosphatase: 245 IU/L — ABNORMAL HIGH (ref 44–121)
Bilirubin Total: 0.2 mg/dL (ref 0.0–1.2)
Bilirubin, Direct: <0.1 mg/dL (ref 0.00–0.40)
Total Protein: 6.6 g/dL (ref 6.0–8.5)

## 2022-01-30 LAB — THYROID PEROXIDASE ANTIBODY: Thyroperoxidase Ab SerPl-aCnc: 541 IU/mL — ABNORMAL HIGH (ref 0–34)

## 2022-02-07 ENCOUNTER — Other Ambulatory Visit: Payer: Self-pay | Admitting: Nurse Practitioner

## 2022-02-07 ENCOUNTER — Ambulatory Visit: Payer: 59 | Attending: Nurse Practitioner

## 2022-02-07 DIAGNOSIS — R748 Abnormal levels of other serum enzymes: Secondary | ICD-10-CM

## 2022-02-08 ENCOUNTER — Other Ambulatory Visit: Payer: Self-pay | Admitting: Nurse Practitioner

## 2022-02-08 DIAGNOSIS — E1165 Type 2 diabetes mellitus with hyperglycemia: Secondary | ICD-10-CM

## 2022-02-08 LAB — CMP14+EGFR
ALT: 28 IU/L (ref 0–44)
AST: 22 IU/L (ref 0–40)
Albumin/Globulin Ratio: 1.5 (ref 1.2–2.2)
Albumin: 4.1 g/dL (ref 4.1–5.1)
Alkaline Phosphatase: 193 IU/L — ABNORMAL HIGH (ref 44–121)
BUN/Creatinine Ratio: 11 (ref 9–20)
BUN: 15 mg/dL (ref 6–24)
Bilirubin Total: 0.6 mg/dL (ref 0.0–1.2)
CO2: 24 mmol/L (ref 20–29)
Calcium: 9.2 mg/dL (ref 8.7–10.2)
Chloride: 101 mmol/L (ref 96–106)
Creatinine, Ser: 1.38 mg/dL — ABNORMAL HIGH (ref 0.76–1.27)
Globulin, Total: 2.7 g/dL (ref 1.5–4.5)
Glucose: 150 mg/dL — ABNORMAL HIGH (ref 70–99)
Potassium: 4 mmol/L (ref 3.5–5.2)
Sodium: 143 mmol/L (ref 134–144)
Total Protein: 6.8 g/dL (ref 6.0–8.5)
eGFR: 63 mL/min/{1.73_m2} (ref >59)

## 2022-02-08 MED ORDER — GLIMEPIRIDE 2 MG PO TABS: 2.0000 mg | ORAL_TABLET | Freq: Every day | ORAL | 3 refills | Status: DC

## 2022-02-08 MED ORDER — EMPAGLIFLOZIN 10 MG PO TABS: 10.0000 mg | ORAL_TABLET | Freq: Every day | ORAL | 6 refills | Status: DC

## 2022-02-19 ENCOUNTER — Other Ambulatory Visit: Payer: Self-pay | Admitting: Nurse Practitioner

## 2022-02-19 DIAGNOSIS — G8929 Other chronic pain: Secondary | ICD-10-CM

## 2022-02-20 NOTE — Telephone Encounter (Signed)
Requested Prescriptions  Pending Prescriptions Disp Refills   gabapentin (NEURONTIN) 600 MG tablet [Pharmacy Med Name: Gabapentin 600 MG Oral Tablet] 45 tablet 0    Sig: TAKE 1/2 (ONE-HALF) TABLET BY MOUTH THREE TIMES DAILY FOR BACK PAIN     Neurology: Anticonvulsants - gabapentin Failed - 02/19/2022  6:21 AM      Failed - Cr in normal range and within 360 days    Creatinine, Ser  Date Value Ref Range Status  02/07/2022 1.38 (H) 0.76 - 1.27 mg/dL Final         Passed - Completed PHQ-2 or PHQ-9 in the last 360 days      Passed - Valid encounter within last 12 months    Recent Outpatient Visits           1 month ago Primary hypertension   [facility] [place], Cornelius Moras, RPH-CPP   2 months ago Type 2 diabetes mellitus with hyperglycemia, without long-term current use of insulin [facility])   [place] [facility] Claiborne Rigg, NP       Future Appointments             In 2 weeks Lois Huxley, Cornelius Moras, RPH-CPP [facility]   In 3 weeks Claiborne Rigg, NP [facility]

## 2022-03-06 ENCOUNTER — Telehealth: Payer: Self-pay

## 2022-03-06 NOTE — Telephone Encounter (Signed)
There aren't any alternative numbers for Mr. Bise.   Copied from CRM #[id number]. Topic: Referral - Status >> Mar 06, 2022  9:30 AM Lyman Speller wrote: Reason for CRM: [facility] received a referral for is pt but they have not been able to get in contact with the pt using the number in his chart / please advise if an alternate number is available or if office can get a message to the pt

## 2022-03-09 ENCOUNTER — Ambulatory Visit: Payer: Self-pay | Admitting: Pharmacist

## 2022-03-14 ENCOUNTER — Ambulatory Visit: Payer: Self-pay | Admitting: Nurse Practitioner

## 2022-03-16 ENCOUNTER — Encounter: Payer: Self-pay | Admitting: Internal Medicine

## 2022-03-16 ENCOUNTER — Ambulatory Visit: Payer: 59 | Attending: Internal Medicine | Admitting: Internal Medicine

## 2022-03-16 VITALS — BP 148/95 | HR 100 | Temp 97.9°F | Ht 68.0 in | Wt 238.0 lb

## 2022-03-16 DIAGNOSIS — E1159 Type 2 diabetes mellitus with other circulatory complications: Secondary | ICD-10-CM | POA: Diagnosis not present

## 2022-03-16 DIAGNOSIS — I152 Hypertension secondary to endocrine disorders: Secondary | ICD-10-CM

## 2022-03-16 DIAGNOSIS — E1165 Type 2 diabetes mellitus with hyperglycemia: Secondary | ICD-10-CM | POA: Diagnosis not present

## 2022-03-16 DIAGNOSIS — K219 Gastro-esophageal reflux disease without esophagitis: Secondary | ICD-10-CM | POA: Diagnosis not present

## 2022-03-16 LAB — POCT GLYCOSYLATED HEMOGLOBIN (HGB A1C): HbA1c, POC (controlled diabetic range): 12.2 % — AB (ref 0.0–7.0)

## 2022-03-16 LAB — GLUCOSE, POCT (MANUAL RESULT ENTRY): POC Glucose: 246 mg/dl — AB (ref 70–99)

## 2022-03-16 MED ORDER — FREESTYLE LIBRE SENSOR SYSTEM MISC: 12 refills | Status: DC

## 2022-03-16 MED ORDER — INSULIN DETEMIR 100 UNIT/ML FLEXPEN: 25.0000 [IU] | PEN_INJECTOR | Freq: Every day | SUBCUTANEOUS | 2 refills | Status: DC

## 2022-03-16 NOTE — Progress Notes (Signed)
Patient ID: Rodney Powell, male    DOB: 1975/07/31  MRN: [id number]  CC: Diabetes (Dm f/u. /Daily acid reflux/ heartburn X1 mo. Bloated stomach, trouble digesting. /Swelling of legs, easily leaving indentations. /No to flu vax.)   Subjective: Rodney Powell is a 46 y.o. male who presents for f/u DM.  PCP is Bertram Denver His concerns today include:   DM: Results for orders placed or performed in visit on 03/16/22  POCT glucose (manual entry)  Result Value Ref Range   POC Glucose 246 (A) 70 - 99 mg/dl  POCT glycosylated hemoglobin (Hb A1C)  Result Value Ref Range   Hemoglobin A1C     HbA1c POC (<> result, manual entry)     HbA1c, POC (prediabetic range)     HbA1c, POC (controlled diabetic range) 12.2 (A) 0.0 - 7.0 %  On Levemir 20 units daily and Jardiance.  On Metformin in past for short time.  Caused stomach upset Checks BS regularly but not every day.  Checks depending on how he feels - vision blurry when BS high. BS can be 200-400s depending how heavy he eats. More in the 200s recently.  Feels like food not digesting properly Food sits on stomach when he eats.  Nausea sometimes Reports heartburn with just about anything he eats even with veggies.  Not taking anything for his symptoms.  Does not want to take extra meds.  Does not want to end up on too much meds like his mother who is on 15 different meds  BP elev.  He says it is also high.  Did not take Diovan as yet today,  limits salt in foods  Patient Active Problem List   Diagnosis Date Noted   Lumbar radiculopathy, chronic 09/25/2021   Abscess of right buttock 01/03/2016   Elevated blood sugar 01/03/2016   History of chest pain 01/03/2016     Current Outpatient Medications on File Prior to Visit  Medication Sig Dispense Refill   atorvastatin (LIPITOR) 40 MG tablet Take 1 tablet (40 mg total) by mouth daily. 90 tablet 3   empagliflozin (JARDIANCE) 10 MG TABS tablet Take 1 tablet (10 mg total) by mouth daily before  breakfast. 30 tablet 6   gabapentin (NEURONTIN) 600 MG tablet TAKE 1/2 (ONE-HALF) TABLET BY MOUTH THREE TIMES DAILY FOR BACK PAIN 45 tablet 0   Insulin Pen Needle (PEN NEEDLES) 32G X 4 MM MISC Use to inject Levemir once a day. 100 each 2   Misc. Devices MISC Please provide patient with insurance approved meter, strips and lancets. E11.65. Strips QTY 200 Lancets QTY 200 with 6 refills. Use as instructed. Check blood glucose level by fingerstick twice per day. 1 each 0   valsartan (DIOVAN) 40 MG tablet Take 1 tablet (40 mg total) by mouth daily. 90 tablet 3   [DISCONTINUED] metFORMIN (GLUCOPHAGE) 500 MG tablet Take 1 tablet (500 mg total) by mouth 2 (two) times daily with a meal. 60 tablet 1   No current facility-administered medications on file prior to visit.    Allergies  Allergen Reactions   Glipizide Er Other (See Comments)    Elevated Liver enzymes    Social History   Socioeconomic History   Marital status: Single    Spouse name: Not on file   Number of children: Not on file   Years of education: Not on file   Highest education level: Not on file  Occupational History   Not on file  Tobacco Use   Smoking  status: Former    Packs/day: 1.00    Years: 22.00    Total pack years: 22.00    Types: Cigars, Cigarettes    Quit date: 04/01/2021    Years since quitting: 0.9   Smokeless tobacco: Never  Vaping Use   Vaping Use: Never used  Substance and Sexual Activity   Alcohol use: Not Currently   Drug use: No   Sexual activity: Not on file  Other Topics Concern   Not on file  Social History Narrative   Not on file   Social Determinants of Health   Financial Resource Strain: Not on file  Food Insecurity: Not on file  Transportation Needs: Not on file  Physical Activity: Not on file  Stress: Not on file  Social Connections: Not on file  Intimate Partner Violence: Not on file    Family History  Problem Relation Age of Onset   Diabetes Mother    Diabetes Brother     Diabetes Maternal Grandmother     Past Surgical History:  Procedure Laterality Date   INCISION AND DRAINAGE PERIRECTAL ABSCESS Right 01/04/2016   Procedure: IRRIGATION AND DEBRIDEMENT RIGHT BUTTOCKS  ABSCESS;  Surgeon: Darnell Level, MD;  Location: [facility];  Service: General;  Laterality: Right;    ROS: Review of Systems Negative except as stated above  PHYSICAL EXAM: BP (!) 148/95 (BP Location: Left Arm, Patient Position: Sitting, Cuff Size: Normal)   Pulse 100   Temp 97.9 F (36.6 C) (Oral)   Ht 5\' 8"  (1.727 m)   Wt 238 lb (108 kg)   SpO2 100%   BMI 36.19 kg/m   Physical Exam   General appearance - alert, well appearing, obese middle age AAM and in no distress Mental status - very talkative and tangential Chest - clear to auscultation, no wheezes, rales or rhonchi, symmetric air entry Heart - normal rate, regular rhythm, normal S1, S2, no murmurs, rubs, clicks or gallops Abdomen - soft, nontender, nondistended, no masses or organomegaly Extremities -no lower extremity edema     Latest Ref Rng & Units 02/07/2022    9:02 AM 01/29/2022    9:06 AM 01/16/2022   11:05 AM  CMP  Glucose 70 - 99 mg/dL 03/18/2022   921   BUN 6 - 24 mg/dL 15   26   Creatinine 194 - 1.27 mg/dL 1.74   0.81   Sodium 4.48 - 144 mmol/L 143   139   Potassium 3.5 - 5.2 mmol/L 4.0   5.1   Chloride 96 - 106 mmol/L 101   103   CO2 20 - 29 mmol/L 24   25   Calcium 8.7 - 10.2 mg/dL 9.2   9.0   Total Protein 6.0 - 8.5 g/dL 6.8  6.6  6.5   Total Bilirubin 0.0 - 1.2 mg/dL 0.6  0.2  0.3   Alkaline Phos 44 - 121 IU/L 193  245  288   AST 0 - 40 IU/L 22  26  43   ALT 0 - 44 IU/L 28  36  99    Lipid Panel     Component Value Date/Time   CHOL 231 (H) 12/19/2021 0941   TRIG 122 12/19/2021 0941   HDL 37 (L) 12/19/2021 0941   CHOLHDL 6.2 (H) 12/19/2021 0941   LDLCALC 172 (H) 12/19/2021 0941    CBC    Component Value Date/Time   WBC 3.8 12/19/2021 0941   WBC 4.8 08/13/2019 0055   RBC 4.77 12/19/2021 0941  RBC  4.99 08/13/2019 0055   HGB 13.3 12/19/2021 0941   HCT 39.9 12/19/2021 0941   PLT 195 12/19/2021 0941   MCV 84 12/19/2021 0941   MCH 27.9 12/19/2021 0941   MCH 30.7 08/13/2019 0055   MCHC 33.3 12/19/2021 0941   MCHC 35.4 08/13/2019 0055   RDW 12.3 12/19/2021 0941   LYMPHSABS 1.9 12/19/2021 0941   MONOABS 0.4 08/13/2019 0055   EOSABS 0.1 12/19/2021 0941   BASOSABS 0.0 12/19/2021 0941    ASSESSMENT AND PLAN:  1. Type 2 diabetes mellitus with hyperglycemia, without long-term current use of insulin (HCC) Not at goal. Recommend increasing Levemir insulin to 25 units at bedtime.  Continue Jardiance 10 mg daily. Discussed and encourage healthy eating habits. Discussed getting him a continuous glucose monitor.  He is agreeable to this.  Prescription sent for sensor for the freestyle libre device.  I told him that he can put the app on his phone and use his phone as the reader. - POCT glucose (manual entry) - POCT glycosylated hemoglobin (Hb A1C)  2. Gastroesophageal reflux disease without esophagitis GERD precautions discussed.  Advised to avoid certain foods like spicy foods, tomato-based foods, juices and excessive caffeine.  Advised to eat his last meal at least 2 to 3 hours before laying down at nights and to sleep with his head slightly elevated.   Recommend starting omeprazole but patient declines medication.  I told him that if he changes his mind he can purchase it over-the-counter.  3. Hypertension associated with type 2 diabetes mellitus (HCC) Not at goal.  He has not taken Diovan as yet for today.  Advised to take it once he gets home.  Patient does not seem to interested in having another medication added.  At the end of the visit, patient wanted to discuss back pain.  I told him that he can discuss that with his PCP on his next visit.  Patient was given the opportunity to ask questions.  Patient verbalized understanding of the plan and was able to repeat key elements of the  plan.   This documentation was completed using Paediatric nurse.  Any transcriptional errors are unintentional.  Orders Placed This Encounter  Procedures   POCT glucose (manual entry)   POCT glycosylated hemoglobin (Hb A1C)     Requested Prescriptions   Signed Prescriptions Disp Refills   insulin detemir (LEVEMIR) 100 UNIT/ML FlexPen 15 mL 2    Sig: Inject 25 Units into the skin at bedtime.   Continuous Blood Gluc Sensor (FREESTYLE LIBRE SENSOR SYSTEM) MISC 2 each 12    Sig: Change sensor Q 2 wks    Return in about 6 weeks (around 04/27/2022) for GIVE HIS FOLLOW-UP with his PCP Bertram Denver.  Jonah Blue, MD, FACP

## 2022-03-16 NOTE — Patient Instructions (Addendum)
Increase insulin to 25 units daily.  I have sent a prescription to your pharmacy for the continuous glucose monitor known as the [place] device.  You can ask the pharmacist to show you how to download the app on your phone and how to apply the device.  Gastroesophageal Reflux Disease, Adult  Gastroesophageal reflux (GER) happens when acid from the stomach flows up into the tube that connects the mouth and the stomach (esophagus). Normally, food travels down the esophagus and stays in the stomach to be digested. With GER, food and stomach acid sometimes move back up into the esophagus. You may have a disease called gastroesophageal reflux disease (GERD) if the reflux: Happens often. Causes frequent or very bad symptoms. Causes problems such as damage to the esophagus. When this happens, the esophagus becomes sore and swollen. Over time, GERD can make small holes (ulcers) in the lining of the esophagus. What are the causes? This condition is caused by a problem with the muscle between the esophagus and the stomach. When this muscle is weak or not normal, it does not close properly to keep food and acid from coming back up from the stomach. The muscle can be weak because of: Tobacco use. Pregnancy. Having a certain type of hernia (hiatal hernia). Alcohol use. Certain foods and drinks, such as coffee, chocolate, onions, and peppermint. What increases the risk? Being overweight. Having a disease that affects your connective tissue. Taking NSAIDs, such a ibuprofen. What are the signs or symptoms? Heartburn. Difficult or painful swallowing. The feeling of having a lump in the throat. A bitter taste in the mouth. Bad breath. Having a lot of saliva. Having an upset or bloated stomach. Burping. Chest pain. Different conditions can cause chest pain. Make sure you see your doctor if you have chest pain. Shortness of breath or wheezing. A long-term cough or a cough at night. Wearing away of the  surface of teeth (tooth enamel). Weight loss. How is this treated? Making changes to your diet. Taking medicine. Having surgery. Treatment will depend on how bad your symptoms are. Follow these instructions at home: Eating and drinking  Follow a diet as told by your doctor. You may need to avoid foods and drinks such as: Coffee and tea, with or without caffeine. Drinks that contain alcohol. Energy drinks and sports drinks. Bubbly (carbonated) drinks or sodas. Chocolate and cocoa. Peppermint and mint flavorings. Garlic and onions. Horseradish. Spicy and acidic foods. These include peppers, chili powder, curry powder, vinegar, hot sauces, and BBQ sauce. Citrus fruit juices and citrus fruits, such as oranges, lemons, and limes. Tomato-based foods. These include red sauce, chili, salsa, and pizza with red sauce. Fried and fatty foods. These include donuts, french fries, potato chips, and high-fat dressings. High-fat meats. These include hot dogs, rib eye steak, sausage, ham, and bacon. High-fat dairy items, such as whole milk, butter, and cream cheese. Eat small meals often. Avoid eating large meals. Avoid drinking large amounts of liquid with your meals. Avoid eating meals during the 2-3 hours before bedtime. Avoid lying down right after you eat. Do not exercise right after you eat. Lifestyle  Do not smoke or use any products that contain nicotine or tobacco. If you need help quitting, ask your doctor. Try to lower your stress. If you need help doing this, ask your doctor. If you are overweight, lose an amount of weight that is healthy for you. Ask your doctor about a safe weight loss goal. General instructions Pay attention to any  changes in your symptoms. Take over-the-counter and prescription medicines only as told by your doctor. Do not take aspirin, ibuprofen, or other NSAIDs unless your doctor says it is okay. Wear loose clothes. Do not wear anything tight around your  waist. Raise (elevate) the head of your bed about 6 inches (15 cm). You may need to use a wedge to do this. Avoid bending over if this makes your symptoms worse. Keep all follow-up visits. Contact a doctor if: You have new symptoms. You lose weight and you do not know why. You have trouble swallowing or it hurts to swallow. You have wheezing or a cough that keeps happening. You have a hoarse voice. Your symptoms do not get better with treatment. Get help right away if: You have sudden pain in your arms, neck, jaw, teeth, or back. You suddenly feel sweaty, dizzy, or light-headed. You have chest pain or shortness of breath. You vomit and the vomit is green, yellow, or black, or it looks like blood or coffee grounds. You faint. Your poop (stool) is red, bloody, or black. You cannot swallow, drink, or eat. These symptoms may represent a serious problem that is an emergency. Do not wait to see if the symptoms will go away. Get medical help right away. Call your local emergency services (911 in the [place] Do not drive yourself to the hospital. Summary If a person has gastroesophageal reflux disease (GERD), food and stomach acid move back up into the esophagus and cause symptoms or problems such as damage to the esophagus. Treatment will depend on how bad your symptoms are. Follow a diet as told by your doctor. Take all medicines only as told by your doctor. This information is not intended to replace advice given to you by your health care provider. Make sure you discuss any questions you have with your health care provider. Document Revised: 10/05/2019 Document Reviewed: 10/05/2019 Elsevier Patient Education  [place].

## 2022-03-22 ENCOUNTER — Telehealth: Payer: Self-pay | Admitting: Emergency Medicine

## 2022-03-22 NOTE — Telephone Encounter (Signed)
Noted  

## 2022-03-22 NOTE — Telephone Encounter (Signed)
Copied from CRM [id number]. Topic: Referral - Status >> Mar 22, 2022  9:45 AM Lyman Speller wrote: Reason for CRM: [facility] / can not reach / they have sent multiple appt cards to contact office and will be cancelling his referral / please advise

## 2022-03-24 ENCOUNTER — Other Ambulatory Visit: Payer: Self-pay | Admitting: Nurse Practitioner

## 2022-03-24 DIAGNOSIS — M545 Low back pain, unspecified: Secondary | ICD-10-CM

## 2022-04-27 ENCOUNTER — Ambulatory Visit: Payer: 59 | Admitting: Nurse Practitioner

## 2022-05-02 ENCOUNTER — Ambulatory Visit: Payer: 59 | Admitting: Nurse Practitioner

## 2022-08-10 ENCOUNTER — Other Ambulatory Visit: Payer: Self-pay | Admitting: Pharmacist

## 2022-08-10 DIAGNOSIS — G8929 Other chronic pain: Secondary | ICD-10-CM

## 2022-08-12 MED ORDER — GABAPENTIN 600 MG PO TABS: ORAL_TABLET | ORAL | 0 refills | Status: DC

## 2022-09-11 ENCOUNTER — Other Ambulatory Visit: Payer: Self-pay | Admitting: Nurse Practitioner

## 2022-09-11 DIAGNOSIS — M545 Low back pain, unspecified: Secondary | ICD-10-CM

## 2022-09-11 NOTE — Telephone Encounter (Signed)
Requested Prescriptions  Pending Prescriptions Disp Refills   gabapentin (NEURONTIN) 600 MG tablet [Pharmacy Med Name: Gabapentin 600 MG Oral Tablet] 90 tablet 0    Sig: TAKE 1/2 (ONE-HALF) TABLET BY MOUTH THREE TIMES DAILY FOR BACK PAIN     Neurology: Anticonvulsants - gabapentin Failed - 09/11/2022 12:24 PM      Failed - Cr in normal range and within 360 days    Creatinine, Ser  Date Value Ref Range Status  02/07/2022 1.38 (H) 0.76 - 1.27 mg/dL Final         Passed - Completed PHQ-2 or PHQ-9 in the last 360 days      Passed - Valid encounter within last 12 months    Recent Outpatient Visits           5 months ago Type 2 diabetes mellitus with hyperglycemia, without long-term current use of insulin [facility])   [place] [facility] Marcine Matar, MD   7 months ago Primary hypertension   [facility] [place], [place] L, RPH-CPP   8 months ago Type 2 diabetes mellitus with hyperglycemia, without long-term current use of insulin [facility])    [facility] [place], Shea Stakes, NP

## 2022-10-08 ENCOUNTER — Other Ambulatory Visit: Payer: Self-pay | Admitting: Internal Medicine

## 2022-11-12 ENCOUNTER — Other Ambulatory Visit: Payer: Self-pay | Admitting: Nurse Practitioner

## 2022-11-24 ENCOUNTER — Other Ambulatory Visit: Payer: Self-pay | Admitting: Nurse Practitioner

## 2022-11-24 DIAGNOSIS — M545 Low back pain, unspecified: Secondary | ICD-10-CM

## 2022-11-27 NOTE — Telephone Encounter (Signed)
Requested Prescriptions  Pending Prescriptions Disp Refills   gabapentin (NEURONTIN) 600 MG tablet [Pharmacy Med Name: Gabapentin 600 MG Oral Tablet] 90 tablet 0    Sig: TAKE 1/2 (ONE-HALF) TABLET BY MOUTH THREE TIMES DAILY FOR BACK PAIN     Neurology: Anticonvulsants - gabapentin Failed - 11/24/2022  4:33 PM      Failed - Cr in normal range and within 360 days    Creatinine, Ser  Date Value Ref Range Status  02/07/2022 1.38 (H) 0.76 - 1.27 mg/dL Final         Passed - Completed PHQ-2 or PHQ-9 in the last 360 days      Passed - Valid encounter within last 12 months    Recent Outpatient Visits           8 months ago Type 2 diabetes mellitus with hyperglycemia, without long-term current use of insulin [facility])   [place] [facility] Marcine Matar, MD   10 months ago Primary hypertension   [place] [facility] [place], [place] L, RPH-CPP   11 months ago Type 2 diabetes mellitus with hyperglycemia, without long-term current use of insulin [facility])   [place] [facility] [place], Shea Stakes, NP               Blood Glucose Monitoring Suppl (ACCU-CHEK GUIDE) w/Device KIT [Pharmacy Med Name: ACCU-CHEK GUIDE     KIT]  0    Sig: USE  TWICE DAILY TO CHECK BLOOD GLUCOSE LEVEL. USE  AS DIRECTED USE TWICE PER DAY     Endocrinology: Diabetes - Testing Supplies Passed - 11/24/2022  4:33 PM      Passed - Valid encounter within last 12 months    Recent Outpatient Visits           8 months ago Type 2 diabetes mellitus with hyperglycemia, without long-term current use of insulin [facility])   [place] [facility] Marcine Matar, MD   10 months ago Primary hypertension   [facility] [place], [place] Senior L, RPH-CPP   11 months ago Type 2 diabetes mellitus with hyperglycemia, without long-term current use of insulin [facility])   [place] [facility] [place], Shea Stakes, NP

## 2022-11-27 NOTE — Telephone Encounter (Signed)
Requested medication (s) are due for refill today:   Requested medication (s) are on the active medication list:   Last refill:  ?  Future visit scheduled: No  Notes to clinic:  See request.    Requested Prescriptions  Pending Prescriptions Disp Refills   Blood Glucose Monitoring Suppl (ACCU-CHEK GUIDE) w/Device KIT [Pharmacy Med Name: ACCU-CHEK GUIDE     KIT]  0    Sig: USE  TWICE DAILY TO CHECK BLOOD GLUCOSE LEVEL. USE  AS DIRECTED USE TWICE PER DAY     Endocrinology: Diabetes - Testing Supplies Passed - 11/24/2022  4:33 PM      Passed - Valid encounter within last 12 months    Recent Outpatient Visits           8 months ago Type 2 diabetes mellitus with hyperglycemia, without long-term current use of insulin [facility])   [place] [facility] Jonah Blue B, MD   10 months ago Primary hypertension   [place] [facility] [place], Cornelius Moras, RPH-CPP   11 months ago Type 2 diabetes mellitus with hyperglycemia, without long-term current use of insulin [facility])   [place] [facility], Shea Stakes, NP              Signed Prescriptions Disp Refills   gabapentin (NEURONTIN) 600 MG tablet 90 tablet 0    Sig: TAKE 1/2 (ONE-HALF) TABLET BY MOUTH THREE TIMES DAILY FOR BACK PAIN     Neurology: Anticonvulsants - gabapentin Failed - 11/24/2022  4:33 PM      Failed - Cr in normal range and within 360 days    Creatinine, Ser  Date Value Ref Range Status  02/07/2022 1.38 (H) 0.76 - 1.27 mg/dL Final         Passed - Completed PHQ-2 or PHQ-9 in the last 360 days      Passed - Valid encounter within last 12 months    Recent Outpatient Visits           8 months ago Type 2 diabetes mellitus with hyperglycemia, without long-term current use of insulin [facility])   [place] [facility] Marcine Matar, MD   10 months ago Primary hypertension   [place] [facility] [place] L, RPH-CPP   11 months ago Type 2 diabetes mellitus with hyperglycemia, without long-term current use of insulin [facility])   [place] [facility] [place], Shea Stakes, NP

## 2022-12-31 ENCOUNTER — Other Ambulatory Visit: Payer: Self-pay | Admitting: Family Medicine

## 2023-01-01 NOTE — Telephone Encounter (Signed)
Requested medication (s) are due for refill today: Yes  Requested medication (s) are on the active medication list: Yes  Last refill:  11/12/22  Future visit scheduled: No  Notes to clinic:  Left message to call and make appointment.    Requested Prescriptions  Pending Prescriptions Disp Refills   JARDIANCE 10 MG TABS tablet [Pharmacy Med Name: Jardiance 10 MG Oral Tablet] 30 tablet 0    Sig: TAKE 1 TABLET BY MOUTH ONCE DAILY BEFORE BREAKFAST . APPOINTMENT REQUIRED FOR FUTURE REFILLS     Endocrinology:  Diabetes - SGLT2 Inhibitors Failed - 12/31/2022 10:59 AM      Failed - Cr in normal range and within 360 days    Creatinine, Ser  Date Value Ref Range Status  02/07/2022 1.38 (H) 0.76 - 1.27 mg/dL Final         Failed - HBA1C is between 0 and 7.9 and within 180 days    HbA1c, POC (controlled diabetic range)  Date Value Ref Range Status  03/16/2022 12.2 (A) 0.0 - 7.0 % Final         Failed - Valid encounter within last 6 months    Recent Outpatient Visits           9 months ago Type 2 diabetes mellitus with hyperglycemia, without long-term current use of insulin [facility])   [place] [facility] Marcine Matar, MD   11 months ago Primary hypertension   [place] [facility] [place], [place] L, RPH-CPP   1 year ago Type 2 diabetes mellitus with hyperglycemia, without long-term current use of insulin [facility])   [place] [facility] [place], [place] W, NP              Passed - eGFR in normal range and within 360 days    GFR calc Af Denyse Dago  Date Value Ref Range Status  08/13/2019 >60 >60 mL/min Final   GFR calc non Af Amer  Date Value Ref Range Status  08/13/2019 >60 >60 mL/min Final   eGFR  Date Value Ref Range Status  02/07/2022 63 >59 mL/min/1.73 Final

## 2023-01-09 ENCOUNTER — Other Ambulatory Visit: Payer: Self-pay | Admitting: Nurse Practitioner

## 2023-01-09 NOTE — Telephone Encounter (Signed)
Unable to refill per protocol, Rx expired. Discontinued 02/08/22.  Requested Prescriptions  Pending Prescriptions Disp Refills   glipiZIDE (GLUCOTROL) 5 MG tablet [Pharmacy Med Name: glipiZIDE 5 MG Oral Tablet] 60 tablet 0    Sig: TAKE 1 TABLET BY MOUTH TWICE DAILY BEFORE A MEAL     Endocrinology:  Diabetes - Sulfonylureas Failed - 01/09/2023  6:22 AM      Failed - HBA1C is between 0 and 7.9 and within 180 days    HbA1c, POC (controlled diabetic range)  Date Value Ref Range Status  03/16/2022 12.2 (A) 0.0 - 7.0 % Final         Failed - Cr in normal range and within 360 days    Creatinine, Ser  Date Value Ref Range Status  02/07/2022 1.38 (H) 0.76 - 1.27 mg/dL Final         Failed - Valid encounter within last 6 months    Recent Outpatient Visits           9 months ago Type 2 diabetes mellitus with hyperglycemia, without long-term current use of insulin (HCC)   [place] [facility] Jonah Blue B, MD   11 months ago Primary hypertension   [place] [facility] [place], [place] L, RPH-CPP   1 year ago Type 2 diabetes mellitus with hyperglycemia, without long-term current use of insulin [facility])   [place] [facility] [place], [place] W, NP               LEVEMIR FLEXPEN 100 UNIT/ML FlexPen [Pharmacy Med Name: Levemir FlexPen 100 UNIT/ML Subcutaneous Solution Pen-injector] 6 mL 0    Sig: INJECT 20 UNITS SUBCUTANEOUSLY AT BEDTIME     Endocrinology:  Diabetes - Insulins Failed - 01/09/2023  6:22 AM      Failed - HBA1C is between 0 and 7.9 and within 180 days    HbA1c, POC (controlled diabetic range)  Date Value Ref Range Status  03/16/2022 12.2 (A) 0.0 - 7.0 % Final         Failed - Valid encounter within last 6 months    Recent Outpatient Visits           9 months ago Type 2 diabetes mellitus with hyperglycemia, without long-term current use of insulin [facility])   [place] [facility] Marcine Matar, MD   11 months ago Primary hypertension   [place] [facility] [place], [place] L, RPH-CPP   1 year ago Type 2 diabetes mellitus with hyperglycemia, without long-term current use of insulin [facility])    [facility] [place], Shea Stakes, NP

## 2023-01-09 NOTE — Telephone Encounter (Signed)
Requested medication (s) are due for refill today: yes  Requested medication (s) are on the active medication list: yes  Last refill:  03/16/22  Future visit scheduled: no  Notes to clinic:  Unable to refill per protocol due to failed labs, no updated results.      Requested Prescriptions  Pending Prescriptions Disp Refills   LEVEMIR FLEXPEN 100 UNIT/ML FlexPen [Pharmacy Med Name: Levemir FlexPen 100 UNIT/ML Subcutaneous Solution Pen-injector] 6 mL 0    Sig: INJECT 20 UNITS SUBCUTANEOUSLY AT BEDTIME     Endocrinology:  Diabetes - Insulins Failed - 01/09/2023  6:22 AM      Failed - HBA1C is between 0 and 7.9 and within 180 days    HbA1c, POC (controlled diabetic range)  Date Value Ref Range Status  03/16/2022 12.2 (A) 0.0 - 7.0 % Final         Failed - Valid encounter within last 6 months    Recent Outpatient Visits           9 months ago Type 2 diabetes mellitus with hyperglycemia, without long-term current use of insulin (HCC)   [place] [facility] Jonah Blue B, MD   11 months ago Primary hypertension   [place] [facility] [place], Cornelius Moras, RPH-CPP   1 year ago Type 2 diabetes mellitus with hyperglycemia, without long-term current use of insulin [facility])   [place] [facility] [place], [place], NP              Refused Prescriptions Disp Refills   glipiZIDE (GLUCOTROL) 5 MG tablet [Pharmacy Med Name: glipiZIDE 5 MG Oral Tablet] 60 tablet 0    Sig: TAKE 1 TABLET BY MOUTH TWICE DAILY BEFORE A MEAL     Endocrinology:  Diabetes - Sulfonylureas Failed - 01/09/2023  6:22 AM      Failed - HBA1C is between 0 and 7.9 and within 180 days    HbA1c, POC (controlled diabetic range)  Date Value Ref Range Status  03/16/2022 12.2 (A) 0.0 - 7.0 % Final         Failed - Cr in normal range and within 360 days    Creatinine, Ser  Date Value Ref Range Status  02/07/2022 1.38 (H) 0.76 - 1.27  mg/dL Final         Failed - Valid encounter within last 6 months    Recent Outpatient Visits           9 months ago Type 2 diabetes mellitus with hyperglycemia, without long-term current use of insulin [facility])   [place] [facility] Marcine Matar, MD   11 months ago Primary hypertension   [place] [facility] [place], [place] L, RPH-CPP   1 year ago Type 2 diabetes mellitus with hyperglycemia, without long-term current use of insulin [facility])   [place] [facility] [place], Shea Stakes, NP

## 2023-01-10 MED ORDER — INSULIN GLARGINE-YFGN 100 UNIT/ML ~~LOC~~ SOPN: 25.0000 [IU] | PEN_INJECTOR | Freq: Every day | SUBCUTANEOUS | 0 refills | Status: DC

## 2023-01-19 ENCOUNTER — Other Ambulatory Visit: Payer: Self-pay | Admitting: Nurse Practitioner

## 2023-01-19 DIAGNOSIS — I10 Essential (primary) hypertension: Secondary | ICD-10-CM

## 2023-01-21 NOTE — Telephone Encounter (Signed)
Patient must keep upcoming appointment for further refills. Requested Prescriptions  Pending Prescriptions Disp Refills   valsartan (DIOVAN) 40 MG tablet [Pharmacy Med Name: Valsartan 40 MG Oral Tablet] 30 tablet 0    Sig: Take 1 tablet by mouth once daily     Cardiovascular:  Angiotensin Receptor Blockers Failed - 01/19/2023  6:21 AM      Failed - Cr in normal range and within 180 days    Creatinine, Ser  Date Value Ref Range Status  02/07/2022 1.38 (H) 0.76 - 1.27 mg/dL Final         Failed - K in normal range and within 180 days    Potassium  Date Value Ref Range Status  02/07/2022 4.0 3.5 - 5.2 mmol/L Final         Failed - Last BP in normal range    BP Readings from Last 1 Encounters:  03/16/22 (!) 148/95         Failed - Valid encounter within last 6 months    Recent Outpatient Visits           10 months ago Type 2 diabetes mellitus with hyperglycemia, without long-term current use of insulin [facility])   [place] [facility] Marcine Matar, MD   1 year ago Primary hypertension   [place] [facility] [place], [place] L, RPH-CPP   1 year ago Type 2 diabetes mellitus with hyperglycemia, without long-term current use of insulin [facility]   [place] [facility] [place], Shea Stakes, NP       Future Appointments             In 4 weeks Claiborne Rigg, NP [facility]            Passed - Patient is not pregnant

## 2023-02-18 ENCOUNTER — Encounter: Payer: Self-pay | Admitting: Nurse Practitioner

## 2023-02-18 ENCOUNTER — Ambulatory Visit: Payer: BLUE CROSS/BLUE SHIELD | Attending: Nurse Practitioner | Admitting: Nurse Practitioner

## 2023-02-18 VITALS — BP 151/91 | HR 88 | Ht 68.0 in | Wt 244.8 lb

## 2023-02-18 DIAGNOSIS — I10 Essential (primary) hypertension: Secondary | ICD-10-CM

## 2023-02-18 DIAGNOSIS — E119 Type 2 diabetes mellitus without complications: Secondary | ICD-10-CM

## 2023-02-18 DIAGNOSIS — E038 Other specified hypothyroidism: Secondary | ICD-10-CM

## 2023-02-18 DIAGNOSIS — M545 Low back pain, unspecified: Secondary | ICD-10-CM

## 2023-02-18 DIAGNOSIS — Z139 Encounter for screening, unspecified: Secondary | ICD-10-CM

## 2023-02-18 DIAGNOSIS — E785 Hyperlipidemia, unspecified: Secondary | ICD-10-CM

## 2023-02-18 DIAGNOSIS — G8929 Other chronic pain: Secondary | ICD-10-CM

## 2023-02-18 DIAGNOSIS — Z794 Long term (current) use of insulin: Secondary | ICD-10-CM

## 2023-02-18 DIAGNOSIS — Z1211 Encounter for screening for malignant neoplasm of colon: Secondary | ICD-10-CM

## 2023-02-18 MED ORDER — GABAPENTIN 600 MG PO TABS: 300.0000 mg | ORAL_TABLET | Freq: Three times a day (TID) | ORAL | 2 refills | Status: DC

## 2023-02-18 MED ORDER — VALSARTAN-HYDROCHLOROTHIAZIDE 80-12.5 MG PO TABS: 1.0000 | ORAL_TABLET | Freq: Every day | ORAL | 3 refills | Status: DC

## 2023-02-18 MED ORDER — ATORVASTATIN CALCIUM 40 MG PO TABS: 40.0000 mg | ORAL_TABLET | Freq: Every day | ORAL | 3 refills | Status: DC

## 2023-02-18 MED ORDER — EMPAGLIFLOZIN 10 MG PO TABS: 10.0000 mg | ORAL_TABLET | Freq: Every day | ORAL | 1 refills | Status: DC

## 2023-02-18 MED ORDER — INSULIN GLARGINE-YFGN 100 UNIT/ML ~~LOC~~ SOPN
25.0000 [IU] | PEN_INJECTOR | Freq: Every day | SUBCUTANEOUS | 1 refills | Status: DC
Start: 2023-02-18 — End: 2023-07-02

## 2023-02-18 MED ORDER — VALSARTAN 40 MG PO TABS: 40.0000 mg | ORAL_TABLET | Freq: Every day | ORAL | 1 refills | Status: DC

## 2023-02-18 NOTE — Progress Notes (Addendum)
Assessment & Plan:  Rodney Powell was seen today for medication refill.  Diagnoses and all orders for this visit:  Insulin-requiring or dependent type II diabetes mellitus  -     insulin glargine-yfgn (SEMGLEE, YFGN,) 100 UNIT/ML Pen; Inject 25 Units into the skin daily. -     empagliflozin (JARDIANCE) 10 MG TABS tablet; Take 1 tablet (10 mg total) by mouth daily before breakfast. -     Hemoglobin A1c -     CMP14+EGFR -     Ambulatory referral to Ophthalmology -     Microalbumin / creatinine urine ratio  Primary hypertension Switched to diovan hct today -     valsartan-hydrochlorothiazide (DIOVAN-HCT) 80-12.5 MG tablet; Take 1 tablet by mouth daily. For blood pressure  Chronic bilateral low back pain without sciatica Well controlled -     gabapentin (NEURONTIN) 600 MG tablet; Take 0.5 tablets (300 mg total) by mouth 3 (three) times daily. TAKE 1/2 (ONE-HALF) TABLET BY MOUTH THREE TIMES DAILY FOR BACK PAIN  Subclinical hypothyroidism -     Thyroid Panel With TSH  Dyslipidemia, goal LDL below 70 Refilled atorvastatin today -     Lipid panel Lab Results  Component Value Date   LDLCALC 172 (H) 12/19/2021     Colon cancer screening -     Ambulatory referral to Gastroenterology    Patient has been counseled on age-appropriate routine health concerns for screening and prevention. These are reviewed and up-to-date. Referrals have been placed accordingly. Immunizations are up-to-date or declined.    Subjective:   Chief Complaint  Patient presents with   Medication Refill    Rodney Powell 47 y.o. male presents to office today for follow up to DM and HTN. I have not seen Rodney Powell since last year.   He endorses BLE swelling. Diabetes is poorly controlled as well.    Feels his digestive system is slow. Last BM yesterday and associated symptoms include bloating. He denies melena, hematochezia.    Blood pressure elevated. He has been out of medications for quite some time.  Currently prescribed valsartan 40 mg daily.  BP Readings from Last 3 Encounters:  02/18/23 (!) 151/91  03/16/22 (!) 148/95  01/16/22 125/83      Blood sugars ranging from 90-220s. Has not had insulin in a few days. I did instruct him that complications of poorly controlled diabetes including blindness and kidney complications/failure. He states his diabetes is improving and has been as high as 17 in the past. I did instruct him that the goal A1c is <7. He is currently prescribed: semglee, and jardiance.  Lab Results  Component Value Date   HGBA1C 12.2 (A) 03/16/2022   Subclinical hypothyroidism Thyroid levels not at goal. He is not taking any thyroid medication at this time.  Lab Results  Component Value Date   TSH 6.180 (H) 01/16/2022     Review of Systems  Constitutional:  Negative for fever, malaise/fatigue and weight loss.  HENT: Negative.  Negative for nosebleeds.   Eyes: Negative.  Negative for blurred vision, double vision and photophobia.  Respiratory: Negative.  Negative for cough and shortness of breath.   Cardiovascular: Negative.  Negative for chest pain, palpitations and leg swelling.  Gastrointestinal: Negative.  Negative for heartburn, nausea and vomiting.  Musculoskeletal: Negative.  Negative for myalgias.  Neurological: Negative.  Negative for dizziness, focal weakness, seizures and headaches.  Psychiatric/Behavioral: Negative.  Negative for suicidal ideas.     Past Medical History:  Diagnosis Date   Back  pain    Diabetes (HCC)    Hypertension     Past Surgical History:  Procedure Laterality Date   INCISION AND DRAINAGE PERIRECTAL ABSCESS Right 01/04/2016   Procedure: IRRIGATION AND DEBRIDEMENT RIGHT BUTTOCKS  ABSCESS;  Surgeon: Darnell Level, MD;  Location: [facility];  Service: General;  Laterality: Right;    Family History  Problem Relation Age of Onset   Diabetes Mother    Diabetes Brother    Diabetes Maternal Grandmother     Social History Reviewed  with no changes to be made today.   Outpatient Medications Prior to Visit  Medication Sig Dispense Refill   Continuous Blood Gluc Sensor (FREESTYLE LIBRE SENSOR SYSTEM) MISC Change sensor Q 2 wks 2 each 12   Blood Glucose Monitoring Suppl (ACCU-CHEK GUIDE) w/Device KIT USE  TWICE DAILY TO CHECK BLOOD GLUCOSE LEVEL. USE  AS DIRECTED USE TWICE PER DAY (Patient not taking: Reported on 02/18/2023) 1 kit 0   Insulin Pen Needle (PEN NEEDLES) 32G X 4 MM MISC Use to inject Levemir once a day. (Patient not taking: Reported on 02/18/2023) 100 each 2   Misc. Devices MISC Please provide patient with insurance approved meter, strips and lancets. E11.65. Strips QTY 200 Lancets QTY 200 with 6 refills. Use as instructed. Check blood glucose level by fingerstick twice per day. (Patient not taking: Reported on 02/18/2023) 1 each 0   atorvastatin (LIPITOR) 40 MG tablet Take 1 tablet (40 mg total) by mouth daily. (Patient not taking: Reported on 02/18/2023) 90 tablet 3   gabapentin (NEURONTIN) 600 MG tablet TAKE 1/2 (ONE-HALF) TABLET BY MOUTH THREE TIMES DAILY FOR BACK PAIN (Patient not taking: Reported on 02/18/2023) 90 tablet 0   insulin glargine-yfgn (SEMGLEE, YFGN,) 100 UNIT/ML Pen Inject 25 Units into the skin daily. (Patient not taking: Reported on 02/18/2023) 15 mL 0   JARDIANCE 10 MG TABS tablet TAKE 1 TABLET BY MOUTH ONCE DAILY BEFORE BREAKFAST (Patient not taking: Reported on 02/18/2023) 30 tablet 0   valsartan (DIOVAN) 40 MG tablet Take 1 tablet by mouth once daily (Patient not taking: Reported on 02/18/2023) 30 tablet 0   No facility-administered medications prior to visit.    Allergies  Allergen Reactions   Glipizide Er Other (See Comments)    Elevated Liver enzymes       Objective:    BP (!) 151/91   Pulse 88   Ht 5\' 8"  (1.727 m)   Wt 244 lb 12.8 oz (111 kg)   SpO2 100%   BMI 37.22 kg/m  Wt Readings from Last 3 Encounters:  02/18/23 244 lb 12.8 oz (111 kg)  03/16/22 238 lb (108 kg)   12/19/21 238 lb 6.4 oz (108.1 kg)    Physical Exam Vitals and nursing note reviewed.  Constitutional:      Appearance: He is well-developed.  HENT:     Head: Normocephalic and atraumatic.  Cardiovascular:     Rate and Rhythm: Normal rate and regular rhythm.     Heart sounds: Normal heart sounds. No murmur heard.    No friction rub. No gallop.  Pulmonary:     Effort: Pulmonary effort is normal. No tachypnea or respiratory distress.     Breath sounds: Normal breath sounds. No decreased breath sounds, wheezing, rhonchi or rales.  Chest:     Chest wall: No tenderness.  Abdominal:     General: Bowel sounds are normal.     Palpations: Abdomen is soft.  Musculoskeletal:        General:  Normal range of motion.     Cervical back: Normal range of motion.  Skin:    General: Skin is warm and dry.  Neurological:     Mental Status: He is alert and oriented to person, place, and time.     Coordination: Coordination normal.  Psychiatric:        Behavior: Behavior normal. Behavior is cooperative.        Thought Content: Thought content normal.        Judgment: Judgment normal.          Patient has been counseled extensively about nutrition and exercise as well as the importance of adherence with medications and regular follow-up. The patient was given clear instructions to go to ER or return to medical center if symptoms don't improve, worsen or new problems develop. The patient verbalized understanding.   Follow-up: No follow-ups on file.   Claiborne Rigg, FNP-BC [facility] [place], [place] [phone]   02/18/2023, 10:40 PM

## 2023-02-19 ENCOUNTER — Telehealth: Payer: Self-pay

## 2023-02-19 NOTE — Telephone Encounter (Signed)
Copied from CRM [id number]. Topic: General - Inquiry >> Feb 18, 2023  4:54 PM Haroldine Laws wrote: Reason for CRM: pharmacy called for clarification on the Valsartan 40 or Valsartan hydrochlorothiazide 80.12.5.  they need to know which one  [phone]  [facility] [address]

## 2023-02-19 NOTE — Telephone Encounter (Signed)
Dosage change confirmed.

## 2023-02-20 ENCOUNTER — Other Ambulatory Visit: Payer: Self-pay | Admitting: Nurse Practitioner

## 2023-02-20 DIAGNOSIS — R7989 Other specified abnormal findings of blood chemistry: Secondary | ICD-10-CM

## 2023-02-20 LAB — LIPID PANEL
Chol/HDL Ratio: 5.6 ratio — ABNORMAL HIGH (ref 0.0–5.0)
Cholesterol, Total: 274 mg/dL — ABNORMAL HIGH (ref 100–199)
HDL: 49 mg/dL (ref >39)
LDL Chol Calc (NIH): 203 mg/dL — ABNORMAL HIGH (ref 0–99)
Triglycerides: 120 mg/dL (ref 0–149)
VLDL Cholesterol Cal: 22 mg/dL (ref 5–40)

## 2023-02-20 LAB — CMP14+EGFR
ALT: 22 [IU]/L (ref 0–44)
AST: 18 IU/L (ref 0–40)
Albumin: 3.4 g/dL — ABNORMAL LOW (ref 4.1–5.1)
Alkaline Phosphatase: 140 IU/L — ABNORMAL HIGH (ref 44–121)
BUN/Creatinine Ratio: 13 (ref 9–20)
BUN: 18 mg/dL (ref 6–24)
Bilirubin Total: 0.4 mg/dL (ref 0.0–1.2)
CO2: 24 mmol/L (ref 20–29)
Calcium: 8.6 mg/dL — ABNORMAL LOW (ref 8.7–10.2)
Chloride: 106 mmol/L (ref 96–106)
Creatinine, Ser: 1.4 mg/dL — ABNORMAL HIGH (ref 0.76–1.27)
Globulin, Total: 2.6 g/dL (ref 1.5–4.5)
Glucose: 166 mg/dL — ABNORMAL HIGH (ref 70–99)
Potassium: 5 mmol/L (ref 3.5–5.2)
Sodium: 141 mmol/L (ref 134–144)
Total Protein: 6 g/dL (ref 6.0–8.5)
eGFR: 62 mL/min/{1.73_m2} (ref >59)

## 2023-02-20 LAB — MICROALBUMIN / CREATININE URINE RATIO
Creatinine, Urine: 283.8 mg/dL
Microalb/Creat Ratio: 4791 mg/g{creat} — ABNORMAL HIGH (ref 0–29)
Microalbumin, Urine: 13597 ug/mL

## 2023-02-20 LAB — HEMOGLOBIN A1C
Est. average glucose Bld gHb Est-mCnc: 223 mg/dL
Hgb A1c MFr Bld: 9.4 % — ABNORMAL HIGH (ref 4.8–5.6)

## 2023-02-20 LAB — THYROID PANEL WITH TSH
Free Thyroxine Index: 1.6 (ref 1.2–4.9)
T3 Uptake Ratio: 27 % (ref 24–39)
T4, Total: 6 ug/dL (ref 4.5–12.0)
TSH: 6.16 u[IU]/mL — ABNORMAL HIGH (ref 0.450–4.500)

## 2023-02-21 NOTE — Addendum Note (Signed)
Addended by: Arbie Cookey on: 02/21/2023 04:05 PM   Modules accepted: Orders

## 2023-02-22 ENCOUNTER — Telehealth: Payer: Self-pay | Admitting: *Deleted

## 2023-02-22 NOTE — Progress Notes (Unsigned)
  Care Coordination  Outreach Note  02/22/2023 Name: Rodney Powell MRN: [id number] DOB: 1976-02-13   Care Coordination Outreach Attempts: An unsuccessful telephone outreach was attempted today to offer the patient information about available care coordination services.  Follow Up Plan:  Additional outreach attempts will be made to offer the patient care coordination information and services.   Encounter Outcome:  No Answer  Gwenevere Ghazi  Care Coordination Care Guide  Direct Dial: [phone]

## 2023-02-25 NOTE — Progress Notes (Signed)
  Care Coordination   Note   02/25/2023 Name: [name] MRN: [id number] DOB: Jan 30, 1976  [name] is a 47 y.o. year old male who sees Claiborne Rigg, NP for primary care. I reached out to [employer] by phone today to offer care coordination services.  Mr. [name] was given information about Care Coordination services today including:   The Care Coordination services include support from the care team which includes your Nurse Coordinator, Clinical Social Worker, or Pharmacist.  The Care Coordination team is here to help remove barriers to the health concerns and goals most important to you. Care Coordination services are voluntary, and the patient may decline or stop services at any time by request to their care team member.   Care Coordination Consent Status: Patient agreed to services and verbal consent obtained.   Follow up plan:  Telephone appointment with care coordination team member scheduled for:  03/06/23  Encounter Outcome:  Patient Scheduled  [facility] Coordination Care Guide  Direct Dial: [phone]

## 2023-03-06 ENCOUNTER — Ambulatory Visit: Payer: Self-pay | Admitting: Licensed Clinical Social Worker

## 2023-03-06 NOTE — Patient Outreach (Signed)
  Care Coordination   03/06/2023 Name: Rodney Powell MRN: [id number] DOB: 05-06-1975   Care Coordination Outreach Attempts:  An unsuccessful telephone outreach was attempted for a scheduled appointment today.  Follow Up Plan:  Additional outreach attempts will be made to offer the patient care coordination information and services.   Encounter Outcome:  No Answer   Care Coordination Interventions:  No, not indicated    Jeanie Cooks, PhD [facility], [facility] Social Worker Direct Dial: [phone]  Fax: [phone]

## 2023-03-25 ENCOUNTER — Ambulatory Visit: Payer: Self-pay | Admitting: Licensed Clinical Social Worker

## 2023-03-25 NOTE — Patient Outreach (Signed)
  Care Coordination   03/25/2023 Name: Tervon Kuyper MRN: [id number] DOB: 1975-12-16   Care Coordination Outreach Attempts:  A second unsuccessful outreach was attempted today to offer the patient with information about available care coordination services.  Follow Up Plan:  No further outreach attempts will be made at this time. We have been unable to contact the patient to offer or enroll patient in care coordination services  Encounter Outcome:  No Answer   Care Coordination Interventions:  No, not indicated    Jeanie Cooks, PhD [facility], [facility] Social Worker Direct Dial: [phone]  Fax: [phone]

## 2023-04-17 ENCOUNTER — Ambulatory Visit: Payer: Self-pay | Admitting: Licensed Clinical Social Worker

## 2023-04-17 NOTE — Patient Outreach (Signed)
  Care Coordination   Initial Visit Note   04/17/2023 Name: Rodney Powell MRN: [id number] DOB: 08/12/1975  Rodney Powell is a 48 y.o. year old male who sees Theotis Haze ORN, NP for primary care. I spoke with  Cara Galeazzi by phone today.  What matters to the patients health and wellness today?  Housing and food Insecurities     Goals Addressed             This Visit's Progress    Care Coordination Activities       Care Coordination Interventions: Patient stated that housing is no longer an issue he is trying to get his rent caught up now. SW will send out resources for future needs. Patient stated that food has been and issue in the past, he has not applied for SNAP and will think about applying, patient is working and there are 2 people in the household. SW will mail and email resources for food pantry Patient provided his email [email or web address] Sw will follow up with patient in 2 weeks        SDOH assessments and interventions completed:  Yes  SDOH Interventions Today    Flowsheet Row Most Recent Value  SDOH Interventions   Food Insecurity Interventions Other (Comment), Assist with SNAP Application  [Patient will apply for SNAP benefits and SW will mail Food Pantry resoruces.]  Housing Interventions Intervention Not Indicated  Transportation Interventions Intervention Not Indicated  Utilities Interventions Intervention Not Indicated        Care Coordination Interventions:  Yes, provided  Interventions Today    Flowsheet Row Most Recent Value  General Interventions   General Interventions Discussed/Reviewed General Interventions Reviewed, [employer] will apply for [employer] benefits and SW will [employer] Pantry resoruces.]        Follow up plan: Follow up call scheduled for 05/01/2023 at 2:15 pm    Encounter Outcome:  Patient Visit Completed   Tobias CHARM Maranda HEDWIG, PhD [facility], [facility] Social  Worker Direct Dial: [phone]  Fax: [phone]

## 2023-04-17 NOTE — Patient Instructions (Signed)
 Visit Information  Thank you for taking time to visit with me today. Please don't hesitate to contact me if I can be of assistance to you.   Following are the goals we discussed today:   Goals Addressed             This Visit's Progress    Care Coordination Activities       Care Coordination Interventions: Patient stated that housing is no longer an issue he is trying to get his rent caught up now. SW will send out resources for future needs. Patient stated that food has been and issue in the past, he has not applied for SNAP and will think about applying, patient is working and there are 2 people in the household. SW will mail and email resources for food pantry Patient provided his email [email or web address] Sw will follow up with patient in 2 weeks        Our next appointment is by telephone on 05/01/2023 at 2:15 pm  Please call the care guide team at [phone] if you need to cancel or reschedule your appointment.   If you are experiencing a Mental Health or Behavioral Health Crisis or need someone to talk to, please call the Suicide and Crisis Lifeline: 988 go to [name] [name] [name]) call 911  The patient verbalized understanding of instructions, educational materials, and care plan provided today and DECLINED offer to receive copy of patient instructions, educational materials, and care plan.   Rodney CHARM Maranda HEDWIG, PhD [facility], [facility] Social Worker Direct Dial: [phone]  Fax: [phone]

## 2023-04-17 NOTE — Patient Outreach (Signed)
  Care Coordination   04/17/2023 Name: Rodney Powell MRN: [id number] DOB: [birth year]   Care Coordination Outreach Attempts:  An unsuccessful outreach was attempted for an appointment today.  Follow Up Plan:  No further outreach attempts will be made at this time. We have been unable to contact the patient to offer or enroll patient in complex care management services.  Encounter Outcome:  No Answer   Care Coordination Interventions:  No, not indicated    Tobias CHARM Maranda HEDWIG, PhD [facility], [facility] Social Worker Direct Dial: [phone]  Fax: [phone]

## 2023-04-23 ENCOUNTER — Emergency Department (HOSPITAL_BASED_OUTPATIENT_CLINIC_OR_DEPARTMENT_OTHER)
Admission: EM | Admit: 2023-04-23 | Discharge: 2023-04-23 | Disposition: A | Discharge status: Home / Self Care | Payer: BLUE CROSS/BLUE SHIELD | Attending: Emergency Medicine | Admitting: Emergency Medicine

## 2023-04-23 ENCOUNTER — Encounter (HOSPITAL_BASED_OUTPATIENT_CLINIC_OR_DEPARTMENT_OTHER): Payer: Self-pay

## 2023-04-23 ENCOUNTER — Emergency Department (HOSPITAL_BASED_OUTPATIENT_CLINIC_OR_DEPARTMENT_OTHER): Payer: BLUE CROSS/BLUE SHIELD

## 2023-04-23 ENCOUNTER — Other Ambulatory Visit: Payer: Self-pay

## 2023-04-23 DIAGNOSIS — M25521 Pain in right elbow: Secondary | ICD-10-CM | POA: Diagnosis present

## 2023-04-23 DIAGNOSIS — M25421 Effusion, right elbow: Secondary | ICD-10-CM

## 2023-04-23 DIAGNOSIS — Z794 Long term (current) use of insulin: Secondary | ICD-10-CM | POA: Insufficient documentation

## 2023-04-23 DIAGNOSIS — Z7984 Long term (current) use of oral hypoglycemic drugs: Secondary | ICD-10-CM | POA: Insufficient documentation

## 2023-04-23 DIAGNOSIS — Z79899 Other long term (current) drug therapy: Secondary | ICD-10-CM | POA: Insufficient documentation
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
## 2023-04-23 NOTE — Discharge Instructions (Addendum)
 Please keep your splint clean and dry.  [facility] with [facility]/Dr. Shon Baton.  Recommend Tylenol ibuprofen rest and ice for pain.  Use a sling for comfort.

## 2023-05-01 ENCOUNTER — Ambulatory Visit: Payer: Self-pay | Admitting: Licensed Clinical Social Worker

## 2023-05-01 NOTE — Patient Outreach (Signed)
  Care Coordination   05/01/2023 Name: Rodney Powell MRN: [id number] DOB: [birth year]   Care Coordination Outreach Attempts:  An unsuccessful outreach was attempted for an appointment today.  Follow Up Plan:  Additional outreach attempts will be made to offer the patient complex care management information and services.   Encounter Outcome:  No Answer   Care Coordination Interventions:  No, not indicated  SW will make another attempt to call patient again   Jeanie Cooks, PhD [facility], [facility] Social Worker Direct Dial: [phone]  Fax: [phone]

## 2023-05-20 ENCOUNTER — Ambulatory Visit: Payer: Self-pay | Admitting: Licensed Clinical Social Worker

## 2023-05-20 NOTE — Patient Outreach (Signed)
  Care Coordination   05/20/2023 Name: Rodney Powell MRN: [id number] DOB: 1976/02/26   Care Coordination Outreach Attempts:  An unsuccessful outreach was attempted for an appointment today.  Follow Up Plan:  Additional outreach attempts will be made to offer the patient complex care management information and services.   Encounter Outcome:  No Answer   Care Coordination Interventions:  No, not indicated    SW attempted to call the patient for a 2nd time today and the call was unsuccessful, Sw will attempt to call for a 3rd attempt on 06/04/2023 at 1:00 pm   Jonda Neighbours, PhD [facility], [facility] Social Worker Direct Dial: [phone]  Fax: [phone]

## 2023-05-21 ENCOUNTER — Telehealth: Payer: Self-pay | Admitting: Licensed Clinical Social Worker

## 2023-05-21 NOTE — Patient Outreach (Signed)
  Care Coordination   Follow Up returned call from patient Visit Note   05/21/2023 Name: Rodney Rodney MRN: [id number] DOB: 1975/10/18  Rodney Rodney is a 48 y.o. year old male who sees Rodney Rigg, NP for primary care. I spoke with  Rodney Rodney by phone today.  What matters to the patients health and wellness today?  Mailed resources     Goals Addressed             This Visit's Progress    COMPLETED: Care Coordination Activities       Care Coordination Interventions: Patient stated that housing is no longer an issue he is trying to get his rent caught up now. SW will send out resources for future needs. Patient stated that food has been and issue in the past, he has not applied for SNAP and will think about applying, patient is working and there are 2 people in the household. SW will mail and email resources for food pantry Patient provided his email Rodney Rodney Sw will follow up with patient in 2 weeks        SDOH assessments and interventions completed:  Yes  SDOH Interventions Today    Flowsheet Row Most Recent Value  SDOH Interventions   Food Insecurity Interventions Intervention Not Indicated  Housing Interventions Intervention Not Indicated  Transportation Interventions Intervention Not Indicated  Utilities Interventions Intervention Not Indicated        Care Coordination Interventions:  Yes, provided  Interventions Today    Flowsheet Row Most Recent Value  General Interventions   General Interventions Discussed/Reviewed General Interventions Reviewed, Science writer received the resources in the mail and does not need any follow up calls]        Follow up plan: No further intervention required.   Encounter Outcome:  Patient Visit Completed   Jeanie Cooks, PhD [facility], [facility] Social Worker Direct Dial: [phone]  Fax: [phone]

## 2023-05-27 ENCOUNTER — Encounter: Payer: Self-pay | Admitting: Nurse Practitioner

## 2023-05-27 ENCOUNTER — Ambulatory Visit: Payer: BLUE CROSS/BLUE SHIELD | Attending: Nurse Practitioner | Admitting: Nurse Practitioner

## 2023-05-27 VITALS — BP 142/90 | HR 88 | Resp 18 | Ht 68.0 in | Wt 243.0 lb

## 2023-05-27 DIAGNOSIS — Z1211 Encounter for screening for malignant neoplasm of colon: Secondary | ICD-10-CM

## 2023-05-27 DIAGNOSIS — N5312 Painful ejaculation: Secondary | ICD-10-CM | POA: Diagnosis not present

## 2023-05-27 DIAGNOSIS — I10 Essential (primary) hypertension: Secondary | ICD-10-CM

## 2023-05-27 DIAGNOSIS — R7989 Other specified abnormal findings of blood chemistry: Secondary | ICD-10-CM

## 2023-05-27 DIAGNOSIS — E119 Type 2 diabetes mellitus without complications: Secondary | ICD-10-CM | POA: Diagnosis not present

## 2023-05-27 DIAGNOSIS — Z7984 Long term (current) use of oral hypoglycemic drugs: Secondary | ICD-10-CM

## 2023-05-27 DIAGNOSIS — Z794 Long term (current) use of insulin: Secondary | ICD-10-CM | POA: Diagnosis not present

## 2023-05-27 LAB — POCT GLYCOSYLATED HEMOGLOBIN (HGB A1C): Hemoglobin A1C: 9 % — AB (ref 4.0–5.6)

## 2023-05-27 MED ORDER — VALSARTAN 160 MG PO TABS: 160.0000 mg | ORAL_TABLET | Freq: Every day | ORAL | 3 refills | Status: DC

## 2023-05-27 MED ORDER — EMPAGLIFLOZIN 25 MG PO TABS: 25.0000 mg | ORAL_TABLET | Freq: Every day | ORAL | 1 refills | Status: DC

## 2023-05-27 NOTE — Progress Notes (Signed)
Assessment & Plan:  Rodney Powell was seen today for diabetes and hypertension.  Diagnoses and all orders for this visit:  Insulin-requiring or dependent type II diabetes mellitus (HCC) -     POCT glycosylated hemoglobin (Hb A1C) -     empagliflozin (JARDIANCE) 25 MG TABS tablet; Take 1 tablet (25 mg total) by mouth daily before breakfast. Continue blood sugar control as discussed in office today, low carbohydrate diet, and regular physical exercise as tolerated, 150 minutes per week (30 min each day, 5 days per week, or 50 min 3 days per week). Keep blood sugar logs with fasting goal of 90-130 mg/dl, post prandial (after you eat) less than 180.  For Hypoglycemia: BS <60 and Hyperglycemia BS >400; contact the clinic ASAP. Annual eye exams and foot exams are recommended.   Primary hypertension DOSE CHANGE -     valsartan (DIOVAN) 160 MG tablet; Take 1 tablet (160 mg total) by mouth daily. DOSE CHANGE. STOP VALSARTAN-HCT Continue all antihypertensives as prescribed.  Reminded to bring in blood pressure log for follow  up appointment.  RECOMMENDATIONS: DASH/Mediterranean Diets are healthier choices for HTN.    Colon cancer screening -     Fecal occult blood, imunochemical([facility])  Painful ejaculation -     Urinalysis, Complete -     PSA  Elevated TSH -     TSH+T4F+T3Free+ThyAbs+TPO+VD25-[facility]    Patient has been counseled on age-appropriate routine health concerns for screening and prevention. These are reviewed and up-to-date. Referrals have been placed accordingly. Immunizations are up-to-date or declined.    Subjective:   Chief Complaint  Patient presents with   Diabetes   Hypertension    Rodney Powell 48 y.o. male presents to office today for follow up to DM and HTN  DM 2 Diabetes is poorly controlled. He is administering semglee 25 units daily and taking Jardiance 25 mg daily as prescribed. Checking blood sugars 1 hour after breakfast. Reports readings in the  160s Lab Results  Component Value Date   HGBA1C 9.0 (A) 05/27/2023    Lab Results  Component Value Date   HGBA1C 9.4 (H) 02/18/2023    HTN Blood pressure is not at goal. He is currently prescribe diovan-hct 80-12.5mg  daily.  BP Readings from Last 3 Encounters:  05/27/23 (!) 142/90  04/23/23 (!) 170/94  02/18/23 (!) 151/91     He endorses dysorgasmia and decreased semen production. Denies any other GU symptoms.   Review of Systems  Constitutional:  Negative for fever, malaise/fatigue and weight loss.  HENT: Negative.  Negative for nosebleeds.   Eyes: Negative.  Negative for blurred vision, double vision and photophobia.  Respiratory: Negative.  Negative for cough and shortness of breath.   Cardiovascular: Negative.  Negative for chest pain, palpitations and leg swelling.  Gastrointestinal: Negative.  Negative for heartburn, nausea and vomiting.  Genitourinary:        SEE HPI  Musculoskeletal: Negative.  Negative for myalgias.  Neurological: Negative.  Negative for dizziness, focal weakness, seizures and headaches.  Psychiatric/Behavioral: Negative.  Negative for suicidal ideas.      Past Medical History:  Diagnosis Date   Back pain    Diabetes (HCC)    Hypertension     Past Surgical History:  Procedure Laterality Date   INCISION AND DRAINAGE PERIRECTAL ABSCESS Right 01/04/2016   Procedure: IRRIGATION AND DEBRIDEMENT RIGHT BUTTOCKS  ABSCESS;  Surgeon: Darnell Level, MD;  Location: [facility];  Service: General;  Laterality: Right;    Family History  Problem Relation  Age of Onset   Diabetes Mother    Diabetes Brother    Diabetes Maternal Grandmother     Social History Reviewed with no changes to be made today.   Outpatient Medications Prior to Visit  Medication Sig Dispense Refill   atorvastatin (LIPITOR) 40 MG tablet Take 1 tablet (40 mg total) by mouth daily. For cholesterol 90 tablet 3   Blood Glucose Monitoring Suppl (ACCU-CHEK GUIDE) w/Device KIT USE  TWICE DAILY  TO CHECK BLOOD GLUCOSE LEVEL. USE  AS DIRECTED USE TWICE PER DAY 1 kit 0   gabapentin (NEURONTIN) 600 MG tablet Take 0.5 tablets (300 mg total) by mouth 3 (three) times daily. TAKE 1/2 (ONE-HALF) TABLET BY MOUTH THREE TIMES DAILY FOR BACK PAIN 90 tablet 2   insulin glargine-yfgn (SEMGLEE, YFGN,) 100 UNIT/ML Pen Inject 25 Units into the skin daily. 15 mL 1   Insulin Pen Needle (PEN NEEDLES) 32G X 4 MM MISC Use to inject Levemir once a day. 100 each 2   Misc. Devices MISC Please provide patient with insurance approved meter, strips and lancets. E11.65. Strips QTY 200 Lancets QTY 200 with 6 refills. Use as instructed. Check blood glucose level by fingerstick twice per day. 1 each 0   empagliflozin (JARDIANCE) 10 MG TABS tablet Take 1 tablet (10 mg total) by mouth daily before breakfast. 90 tablet 1   valsartan-hydrochlorothiazide (DIOVAN-HCT) 80-12.5 MG tablet Take 1 tablet by mouth daily. For blood pressure 90 tablet 3   Continuous Blood Gluc Sensor (FREESTYLE LIBRE SENSOR SYSTEM) MISC Change sensor Q 2 wks (Patient not taking: Reported on 05/27/2023) 2 each 12   No facility-administered medications prior to visit.    Allergies  Allergen Reactions   Glipizide Er Other (See Comments)    Elevated Liver enzymes       Objective:    BP (!) 142/90 (Cuff Size: Normal)   Pulse 88   Resp 18   Ht 5\' 8"  (1.727 m)   Wt 243 lb (110.2 kg)   SpO2 100%   BMI 36.95 kg/m  Wt Readings from Last 3 Encounters:  05/27/23 243 lb (110.2 kg)  02/18/23 244 lb 12.8 oz (111 kg)  03/16/22 238 lb (108 kg)    Physical Exam Vitals and nursing note reviewed.  Constitutional:      Appearance: He is Powell-developed.  HENT:     Head: Normocephalic and atraumatic.  Cardiovascular:     Rate and Rhythm: Normal rate and regular rhythm.     Heart sounds: Normal heart sounds. No murmur heard.    No friction rub. No gallop.  Pulmonary:     Effort: Pulmonary effort is normal. No tachypnea or respiratory distress.      Breath sounds: Normal breath sounds. No decreased breath sounds, wheezing, rhonchi or rales.  Chest:     Chest wall: No tenderness.  Abdominal:     General: Bowel sounds are normal.     Palpations: Abdomen is soft.  Musculoskeletal:        General: Normal range of motion.     Cervical back: Normal range of motion.  Skin:    General: Skin is warm and dry.  Neurological:     Mental Status: He is alert and oriented to person, place, and time.     Coordination: Coordination normal.  Psychiatric:        Behavior: Behavior normal. Behavior is cooperative.        Thought Content: Thought content normal.  Judgment: Judgment normal.          Patient has been counseled extensively about nutrition and exercise as Powell as the importance of adherence with medications and regular follow-up. The patient was given clear instructions to go to ER or return to medical center if symptoms don't improve, worsen or new problems develop. The patient verbalized understanding.   Follow-up: Return in about 3 months (around 08/24/2023).   Claiborne Rigg, FNP-BC [facility] [place], [place] [phone]   05/27/2023, 7:49 PM

## 2023-05-27 NOTE — Patient Instructions (Signed)
Blood sugars fasting/before you eat should be 90-130  Blood sugars 1-2 hours after you eat should be less than 180

## 2023-05-28 DIAGNOSIS — E785 Hyperlipidemia, unspecified: Secondary | ICD-10-CM | POA: Diagnosis present

## 2023-05-28 DIAGNOSIS — E1165 Type 2 diabetes mellitus with hyperglycemia: Secondary | ICD-10-CM | POA: Diagnosis present

## 2023-05-28 LAB — URINALYSIS, COMPLETE
Bilirubin, UA: NEGATIVE
Ketones, UA: NEGATIVE
Leukocytes,UA: NEGATIVE
Nitrite, UA: NEGATIVE
Specific Gravity, UA: 1.023 (ref 1.005–1.030)
Urobilinogen, Ur: 0.2 mg/dL (ref 0.2–1.0)
pH, UA: 5.5 (ref 5.0–7.5)

## 2023-05-28 LAB — TSH+T4F+T3FREE+THYABS+TPO+VD25
Free T4: 0.89 ng/dL (ref 0.82–1.77)
T3, Free: 2.2 pg/mL (ref 2.0–4.4)
TSH: 5.36 u[IU]/mL — ABNORMAL HIGH (ref 0.450–4.500)
Thyroglobulin Antibody: 2.1 [IU]/mL — ABNORMAL HIGH (ref 0.0–0.9)
Thyroperoxidase Ab SerPl-aCnc: >600 [IU]/mL — ABNORMAL HIGH (ref 0–34)
Vit D, 25-Hydroxy: 13.3 ng/mL — ABNORMAL LOW (ref 30.0–100.0)

## 2023-05-28 LAB — MICROSCOPIC EXAMINATION
Bacteria, UA: NONE SEEN
Casts: NONE SEEN /[LPF]
Epithelial Cells (non renal): NONE SEEN /[HPF] (ref 0–10)
WBC, UA: NONE SEEN /[HPF] (ref 0–5)

## 2023-05-28 LAB — PSA: Prostate Specific Ag, Serum: 2.6 ng/mL (ref 0.0–4.0)

## 2023-06-04 ENCOUNTER — Encounter: Payer: BLUE CROSS/BLUE SHIELD | Admitting: Licensed Clinical Social Worker

## 2023-06-06 LAB — FECAL OCCULT BLOOD, IMMUNOCHEMICAL: Fecal Occult Bld: NEGATIVE

## 2023-06-09 ENCOUNTER — Other Ambulatory Visit: Payer: Self-pay | Admitting: Nurse Practitioner

## 2023-06-09 DIAGNOSIS — N5312 Painful ejaculation: Secondary | ICD-10-CM

## 2023-06-09 DIAGNOSIS — R809 Proteinuria, unspecified: Secondary | ICD-10-CM

## 2023-06-09 DIAGNOSIS — E038 Other specified hypothyroidism: Secondary | ICD-10-CM

## 2023-06-09 DIAGNOSIS — E559 Vitamin D deficiency, unspecified: Secondary | ICD-10-CM

## 2023-06-09 MED ORDER — VITAMIN D (ERGOCALCIFEROL) 1.25 MG (50000 UNIT) PO CAPS: 50000.0000 [IU] | ORAL_CAPSULE | ORAL | 0 refills | Status: DC

## 2023-06-10 ENCOUNTER — Telehealth: Payer: Self-pay

## 2023-06-10 NOTE — Telephone Encounter (Signed)
 Pt was called and is aware of results, DOB was confirmed.  ?

## 2023-06-10 NOTE — Telephone Encounter (Signed)
-----   Message from Claiborne Rigg sent at 06/09/2023 10:06 AM EST ----- High blood pressure and now protein in urine. Will need to refer to kidney doctor. They will call you to schedule  Vitamin d is low. Will send prescription to the pharmacy and you will take 1 pill a week for the next 3 months. After that you can take over the counter vitamin D 2000 units daily   Thyroid levels are abnormal. I referred you to the endocrinologist for this a few years ago. It does not they were able to get in touch with you.  A new referral will be placed and they will also call you to schedule. Make sure you are checking your phone messages   Stool test negative for blood  Normal prostate level. Will place referral to Urology for Genitourinary concerns we discussed in office. Again all these specialists will call you to schedule.

## 2023-07-01 ENCOUNTER — Other Ambulatory Visit: Payer: Self-pay | Admitting: Nurse Practitioner

## 2023-07-01 DIAGNOSIS — E119 Type 2 diabetes mellitus without complications: Secondary | ICD-10-CM

## 2023-07-02 NOTE — Telephone Encounter (Signed)
 Requested medication (s) are due for refill today: Yes  Requested medication (s) are on the active medication list: Yes  Last refill:  02/18/23  Future visit scheduled: Yes  Notes to clinic:  Unable to refill due to no refill protocol for this medication.      Requested Prescriptions  Pending Prescriptions Disp Refills   SEMGLEE, YFGN, 100 UNIT/ML Pen [Pharmacy Med Name: Semglee (yfgn) 100 UNIT/ML Subcutaneous Solution Pen-injector] 15 mL 0    Sig: INJECT 25 UNITS SUBCUTANEOUSLY ONCE DAILY     Off-Protocol Failed - 07/02/2023  3:34 PM      Failed - Medication not assigned to a protocol, review manually.      Passed - Valid encounter within last 12 months    Recent Outpatient Visits           1 month ago Insulin-requiring or dependent type II diabetes mellitus (HCC)   [facility] [place]. [facility] [place], [place] W, NP   4 months ago Insulin-requiring or dependent type II diabetes mellitus [facility])   [place] [facility] Of [place]. [facility] [place], [place] W, NP   1 year ago Type 2 diabetes mellitus with hyperglycemia, without long-term current use of insulin (HCC)   [place] [facility] - [facility] Dept Of [place]. [facility] Marcine Matar, MD   1 year ago Primary hypertension   [facility] Dept Of [place]. [facility] Lois Huxley, [place] L, RPH-CPP   1 year ago Type 2 diabetes mellitus with hyperglycemia, without long-term current use of insulin (HCC)    [facility] - [facility] Dept Of [place]. [facility] Claiborne Rigg, NP       Future Appointments             In 1 month Claiborne Rigg, NP [facility] - A Dept Of . [facility]
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
## 2023-08-30 ENCOUNTER — Ambulatory Visit: Payer: BLUE CROSS/BLUE SHIELD | Admitting: Nurse Practitioner

## 2023-10-07 ENCOUNTER — Ambulatory Visit: Admitting: Nurse Practitioner

## 2023-12-21 ENCOUNTER — Encounter (HOSPITAL_BASED_OUTPATIENT_CLINIC_OR_DEPARTMENT_OTHER): Payer: Self-pay | Admitting: Emergency Medicine

## 2023-12-21 ENCOUNTER — Emergency Department (HOSPITAL_BASED_OUTPATIENT_CLINIC_OR_DEPARTMENT_OTHER): Payer: Self-pay

## 2023-12-21 ENCOUNTER — Other Ambulatory Visit: Payer: Self-pay

## 2023-12-21 ENCOUNTER — Encounter (HOSPITAL_COMMUNITY)
Admission: EM | Disposition: A | Discharge status: Home / Self Care | Payer: Self-pay | Source: Home / Self Care | Attending: Emergency Medicine

## 2023-12-21 ENCOUNTER — Emergency Department (HOSPITAL_COMMUNITY): Payer: Self-pay | Admitting: Anesthesiology

## 2023-12-21 ENCOUNTER — Observation Stay (HOSPITAL_BASED_OUTPATIENT_CLINIC_OR_DEPARTMENT_OTHER)
Admission: EM | Admit: 2023-12-21 | Discharge: 2023-12-22 | Disposition: A | Discharge status: Home / Self Care | Payer: Self-pay | Attending: Internal Medicine | Admitting: Internal Medicine

## 2023-12-21 DIAGNOSIS — E1165 Type 2 diabetes mellitus with hyperglycemia: Secondary | ICD-10-CM | POA: Insufficient documentation

## 2023-12-21 DIAGNOSIS — M7989 Other specified soft tissue disorders: Principal | ICD-10-CM

## 2023-12-21 DIAGNOSIS — E119 Type 2 diabetes mellitus without complications: Secondary | ICD-10-CM

## 2023-12-21 DIAGNOSIS — I129 Hypertensive chronic kidney disease with stage 1 through stage 4 chronic kidney disease, or unspecified chronic kidney disease: Secondary | ICD-10-CM

## 2023-12-21 DIAGNOSIS — L03113 Cellulitis of right upper limb: Secondary | ICD-10-CM

## 2023-12-21 DIAGNOSIS — Z794 Long term (current) use of insulin: Secondary | ICD-10-CM | POA: Insufficient documentation

## 2023-12-21 DIAGNOSIS — M65941 Unspecified synovitis and tenosynovitis, right hand: Secondary | ICD-10-CM

## 2023-12-21 DIAGNOSIS — N179 Acute kidney failure, unspecified: Secondary | ICD-10-CM | POA: Insufficient documentation

## 2023-12-21 DIAGNOSIS — Z87891 Personal history of nicotine dependence: Secondary | ICD-10-CM | POA: Insufficient documentation

## 2023-12-21 DIAGNOSIS — G8929 Other chronic pain: Secondary | ICD-10-CM

## 2023-12-21 DIAGNOSIS — R609 Edema, unspecified: Secondary | ICD-10-CM | POA: Insufficient documentation

## 2023-12-21 DIAGNOSIS — L02511 Cutaneous abscess of right hand: Secondary | ICD-10-CM | POA: Insufficient documentation

## 2023-12-21 DIAGNOSIS — N189 Chronic kidney disease, unspecified: Secondary | ICD-10-CM

## 2023-12-21 DIAGNOSIS — E1122 Type 2 diabetes mellitus with diabetic chronic kidney disease: Secondary | ICD-10-CM

## 2023-12-21 DIAGNOSIS — M65841 Other synovitis and tenosynovitis, right hand: Principal | ICD-10-CM | POA: Insufficient documentation

## 2023-12-21 DIAGNOSIS — L03011 Cellulitis of right finger: Secondary | ICD-10-CM | POA: Insufficient documentation

## 2023-12-21 DIAGNOSIS — M71041 Abscess of bursa, right hand: Secondary | ICD-10-CM | POA: Insufficient documentation

## 2023-12-21 DIAGNOSIS — E785 Hyperlipidemia, unspecified: Secondary | ICD-10-CM | POA: Insufficient documentation

## 2023-12-21 DIAGNOSIS — D649 Anemia, unspecified: Secondary | ICD-10-CM | POA: Insufficient documentation

## 2023-12-21 DIAGNOSIS — L0390 Cellulitis, unspecified: Secondary | ICD-10-CM | POA: Insufficient documentation

## 2023-12-21 DIAGNOSIS — Z79899 Other long term (current) drug therapy: Secondary | ICD-10-CM | POA: Insufficient documentation

## 2023-12-21 DIAGNOSIS — M65949 Unspecified synovitis and tenosynovitis, unspecified hand: Secondary | ICD-10-CM

## 2023-12-21 DIAGNOSIS — I10 Essential (primary) hypertension: Secondary | ICD-10-CM | POA: Insufficient documentation

## 2023-12-21 HISTORY — PX: INCISION AND DRAINAGE OF WOUND: SHX1803

## 2023-12-21 LAB — GLUCOSE, CAPILLARY
Glucose-Capillary: 304 mg/dL — ABNORMAL HIGH (ref 70–99)
Glucose-Capillary: 125 mg/dL — ABNORMAL HIGH (ref 70–99)
Glucose-Capillary: 89 mg/dL (ref 70–99)
Glucose-Capillary: 121 mg/dL — ABNORMAL HIGH (ref 70–99)

## 2023-12-21 LAB — CBC WITH DIFFERENTIAL/PLATELET
Abs Immature Granulocytes: 0.03 K/uL (ref 0.00–0.07)
Basophils Absolute: 0 K/uL (ref 0.0–0.1)
Basophils Relative: 0 %
Eosinophils Absolute: 0 K/uL (ref 0.0–0.5)
Eosinophils Relative: 0 %
HCT: 27.5 % — ABNORMAL LOW (ref 39.0–52.0)
Hemoglobin: 9.8 g/dL — ABNORMAL LOW (ref 13.0–17.0)
Immature Granulocytes: 0 %
Lymphocytes Relative: 8 %
Lymphs Abs: 0.8 K/uL (ref 0.7–4.0)
MCH: 28.9 pg (ref 26.0–34.0)
MCHC: 35.6 g/dL (ref 30.0–36.0)
MCV: 81.1 fL (ref 80.0–100.0)
Monocytes Absolute: 0.6 K/uL (ref 0.1–1.0)
Monocytes Relative: 7 %
Neutro Abs: 8.2 K/uL — ABNORMAL HIGH (ref 1.7–7.7)
Neutrophils Relative %: 85 %
Platelets: 175 K/uL (ref 150–400)
RBC: 3.39 MIL/uL — ABNORMAL LOW (ref 4.22–5.81)
RDW: 12.9 % (ref 11.5–15.5)
WBC: 9.6 K/uL (ref 4.0–10.5)
nRBC: 0 % (ref 0.0–0.2)

## 2023-12-21 LAB — COMPREHENSIVE METABOLIC PANEL WITH GFR
ALT: 29 U/L (ref 0–44)
AST: 26 U/L (ref 15–41)
Albumin: 3.1 g/dL — ABNORMAL LOW (ref 3.5–5.0)
Alkaline Phosphatase: 189 U/L — ABNORMAL HIGH (ref 38–126)
Anion gap: 12 (ref 5–15)
BUN: 25 mg/dL — ABNORMAL HIGH (ref 6–20)
CO2: 21 mmol/L — ABNORMAL LOW (ref 22–32)
Calcium: 8 mg/dL — ABNORMAL LOW (ref 8.9–10.3)
Chloride: 101 mmol/L (ref 98–111)
Creatinine, Ser: 2.16 mg/dL — ABNORMAL HIGH (ref 0.61–1.24)
GFR, Estimated: 37 mL/min — ABNORMAL LOW (ref >60)
Glucose, Bld: 412 mg/dL — ABNORMAL HIGH (ref 70–99)
Potassium: 4.4 mmol/L (ref 3.5–5.1)
Sodium: 134 mmol/L — ABNORMAL LOW (ref 135–145)
Total Bilirubin: 0.3 mg/dL (ref 0.0–1.2)
Total Protein: 5.9 g/dL — ABNORMAL LOW (ref 6.5–8.1)

## 2023-12-21 LAB — SEDIMENTATION RATE: Sed Rate: 62 mm/h — ABNORMAL HIGH (ref 0–16)

## 2023-12-21 LAB — LACTIC ACID, PLASMA: Lactic Acid, Venous: 1.3 mmol/L (ref 0.5–1.9)

## 2023-12-21 LAB — C-REACTIVE PROTEIN: CRP: 3.3 mg/dL — ABNORMAL HIGH (ref <1.0)

## 2023-12-21 SURGERY — IRRIGATION AND DEBRIDEMENT WOUND
Anesthesia: General | Site: Middle Finger | Laterality: Right

## 2023-12-21 MED ORDER — HYDROMORPHONE HCL 1 MG/ML IJ SOLN
0.5000 mg | INTRAMUSCULAR | Status: DC | PRN
  Administered 2023-12-21 – 2023-12-22 (×3): 0.5 mg via INTRAVENOUS
  Filled 2023-12-21 (×3): qty 0.5

## 2023-12-21 MED ORDER — SODIUM CHLORIDE 0.9 % IV SOLN: INTRAVENOUS | Status: DC

## 2023-12-21 MED ORDER — IRBESARTAN 150 MG PO TABS
150.0000 mg | ORAL_TABLET | Freq: Every day | ORAL | Status: DC
  Administered 2023-12-21 – 2023-12-22 (×2): 150 mg via ORAL
  Filled 2023-12-21 (×2): qty 1

## 2023-12-21 MED ORDER — INSULIN ASPART 100 UNIT/ML IJ SOLN
0.0000 [IU] | Freq: Three times a day (TID) | INTRAMUSCULAR | Status: DC
  Administered 2023-12-22: 2 [IU] via SUBCUTANEOUS

## 2023-12-21 MED ORDER — FENTANYL CITRATE (PF) 100 MCG/2ML IJ SOLN
25.0000 ug | INTRAMUSCULAR | Status: DC | PRN
  Administered 2023-12-21 (×2): 50 ug via INTRAVENOUS

## 2023-12-21 MED ORDER — CEFAZOLIN SODIUM-DEXTROSE 2-4 GM/100ML-% IV SOLN
2.0000 g | Freq: Once | INTRAVENOUS | Status: AC
  Administered 2023-12-21: 2 g via INTRAVENOUS

## 2023-12-21 MED ORDER — PHENYLEPHRINE 80 MCG/ML (10ML) SYRINGE FOR IV PUSH (FOR BLOOD PRESSURE SUPPORT)
PREFILLED_SYRINGE | INTRAVENOUS | Status: DC | PRN
  Administered 2023-12-21 (×2): 120 ug via INTRAVENOUS

## 2023-12-21 MED ORDER — INSULIN GLARGINE 100 UNIT/ML ~~LOC~~ SOLN
15.0000 [IU] | Freq: Every day | SUBCUTANEOUS | Status: DC
  Administered 2023-12-22: 15 [IU] via SUBCUTANEOUS
  Filled 2023-12-21: qty 0.15

## 2023-12-21 MED ORDER — FENTANYL CITRATE (PF) 250 MCG/5ML IJ SOLN
INTRAMUSCULAR | Status: AC
  Filled 2023-12-21: qty 5

## 2023-12-21 MED ORDER — ACETAMINOPHEN 10 MG/ML IV SOLN
INTRAVENOUS | Status: AC
  Filled 2023-12-21: qty 100

## 2023-12-21 MED ORDER — INSULIN ASPART 100 UNIT/ML IJ SOLN: 0.0000 [IU] | Freq: Every day | INTRAMUSCULAR | Status: DC

## 2023-12-21 MED ORDER — 0.9 % SODIUM CHLORIDE (POUR BTL) OPTIME
TOPICAL | Status: DC | PRN
  Administered 2023-12-21: 1000 mL

## 2023-12-21 MED ORDER — INSULIN ASPART 100 UNIT/ML IJ SOLN
0.0000 [IU] | INTRAMUSCULAR | Status: DC | PRN
  Administered 2023-12-21: 10 [IU] via SUBCUTANEOUS

## 2023-12-21 MED ORDER — CEFAZOLIN SODIUM-DEXTROSE 2-4 GM/100ML-% IV SOLN
2.0000 g | Freq: Three times a day (TID) | INTRAVENOUS | Status: DC
  Administered 2023-12-21 – 2023-12-22 (×3): 2 g via INTRAVENOUS
  Filled 2023-12-21 (×3): qty 100

## 2023-12-21 MED ORDER — ORAL CARE MOUTH RINSE: 15.0000 mL | Freq: Once | OROMUCOSAL | Status: AC

## 2023-12-21 MED ORDER — FENTANYL CITRATE (PF) 250 MCG/5ML IJ SOLN
INTRAMUSCULAR | Status: DC | PRN
  Administered 2023-12-21: 100 ug via INTRAVENOUS
  Administered 2023-12-21: 50 ug via INTRAVENOUS

## 2023-12-21 MED ORDER — MIDAZOLAM HCL 2 MG/2ML IJ SOLN
INTRAMUSCULAR | Status: AC
  Filled 2023-12-21: qty 2

## 2023-12-21 MED ORDER — CEFAZOLIN SODIUM-DEXTROSE 2-4 GM/100ML-% IV SOLN
INTRAVENOUS | Status: AC
  Filled 2023-12-21: qty 100

## 2023-12-21 MED ORDER — HYDROCODONE-ACETAMINOPHEN 5-325 MG PO TABS
1.0000 | ORAL_TABLET | Freq: Four times a day (QID) | ORAL | Status: DC | PRN
  Administered 2023-12-22 (×2): 1 via ORAL
  Filled 2023-12-21 (×3): qty 1

## 2023-12-21 MED ORDER — PROPOFOL 10 MG/ML IV BOLUS
INTRAVENOUS | Status: DC | PRN
  Administered 2023-12-21: 150 mg via INTRAVENOUS

## 2023-12-21 MED ORDER — MIDAZOLAM HCL 2 MG/2ML IJ SOLN
INTRAMUSCULAR | Status: DC | PRN
  Administered 2023-12-21: 2 mg via INTRAVENOUS

## 2023-12-21 MED ORDER — ACETAMINOPHEN 325 MG PO TABS: 650.0000 mg | ORAL_TABLET | Freq: Four times a day (QID) | ORAL | Status: DC | PRN

## 2023-12-21 MED ORDER — OXYCODONE HCL 5 MG PO TABS: 5.0000 mg | ORAL_TABLET | Freq: Once | ORAL | Status: DC | PRN

## 2023-12-21 MED ORDER — OXYCODONE-ACETAMINOPHEN 5-325 MG PO TABS
1.0000 | ORAL_TABLET | Freq: Once | ORAL | Status: AC
Start: 2023-12-21 — End: 2023-12-21
  Administered 2023-12-21: 1 via ORAL
  Filled 2023-12-21: qty 1

## 2023-12-21 MED ORDER — CLINDAMYCIN PHOSPHATE 600 MG/50ML IV SOLN
600.0000 mg | Freq: Once | INTRAVENOUS | Status: AC
  Administered 2023-12-21: 600 mg via INTRAVENOUS
  Filled 2023-12-21: qty 50

## 2023-12-21 MED ORDER — PROPOFOL 10 MG/ML IV BOLUS
INTRAVENOUS | Status: AC
  Filled 2023-12-21: qty 20

## 2023-12-21 MED ORDER — INSULIN ASPART 100 UNIT/ML IJ SOLN
8.0000 [IU] | Freq: Once | INTRAMUSCULAR | Status: AC
  Administered 2023-12-21: 8 [IU] via SUBCUTANEOUS

## 2023-12-21 MED ORDER — ONDANSETRON HCL 4 MG/2ML IJ SOLN: 4.0000 mg | Freq: Once | INTRAMUSCULAR | Status: DC | PRN

## 2023-12-21 MED ORDER — ACETAMINOPHEN 650 MG RE SUPP: 650.0000 mg | Freq: Four times a day (QID) | RECTAL | Status: DC | PRN

## 2023-12-21 MED ORDER — FENTANYL CITRATE (PF) 100 MCG/2ML IJ SOLN
INTRAMUSCULAR | Status: AC
  Filled 2023-12-21: qty 2

## 2023-12-21 MED ORDER — CHLORHEXIDINE GLUCONATE 0.12 % MT SOLN
OROMUCOSAL | Status: AC
Start: 2023-12-21 — End: 2023-12-21
  Filled 2023-12-21: qty 15

## 2023-12-21 MED ORDER — ONDANSETRON HCL 4 MG/2ML IJ SOLN
INTRAMUSCULAR | Status: AC
  Filled 2023-12-21: qty 2

## 2023-12-21 MED ORDER — ONDANSETRON HCL 4 MG/2ML IJ SOLN
INTRAMUSCULAR | Status: DC | PRN
  Administered 2023-12-21: 4 mg via INTRAVENOUS

## 2023-12-21 MED ORDER — POLYETHYLENE GLYCOL 3350 17 G PO PACK: 17.0000 g | PACK | Freq: Every day | ORAL | Status: DC | PRN

## 2023-12-21 MED ORDER — OXYCODONE HCL 5 MG/5ML PO SOLN: 5.0000 mg | Freq: Once | ORAL | Status: DC | PRN

## 2023-12-21 MED ORDER — LIDOCAINE 2% (20 MG/ML) 5 ML SYRINGE
INTRAMUSCULAR | Status: AC
  Filled 2023-12-21: qty 5

## 2023-12-21 MED ORDER — LACTATED RINGERS IV BOLUS: 500.0000 mL | Freq: Once | INTRAVENOUS | Status: DC

## 2023-12-21 MED ORDER — ACETAMINOPHEN 10 MG/ML IV SOLN
1000.0000 mg | Freq: Once | INTRAVENOUS | Status: DC | PRN
  Administered 2023-12-21: 1000 mg via INTRAVENOUS

## 2023-12-21 MED ORDER — OXYCODONE HCL 5 MG PO TABS: 5.0000 mg | ORAL_TABLET | Freq: Four times a day (QID) | ORAL | Status: DC | PRN

## 2023-12-21 MED ORDER — LACTATED RINGERS IV SOLN: INTRAVENOUS | Status: AC

## 2023-12-21 MED ORDER — LIDOCAINE HCL (CARDIAC) PF 100 MG/5ML IV SOSY
PREFILLED_SYRINGE | INTRAVENOUS | Status: DC | PRN
  Administered 2023-12-21: 100 mg via INTRATRACHEAL

## 2023-12-21 MED ORDER — CHLORHEXIDINE GLUCONATE 0.12 % MT SOLN
15.0000 mL | Freq: Once | OROMUCOSAL | Status: AC
  Administered 2023-12-21: 15 mL via OROMUCOSAL

## 2023-12-21 MED ORDER — DEXAMETHASONE SODIUM PHOSPHATE 10 MG/ML IJ SOLN
INTRAMUSCULAR | Status: AC
  Filled 2023-12-21: qty 1

## 2023-12-21 MED ORDER — DEXMEDETOMIDINE HCL IN NACL 80 MCG/20ML IV SOLN
INTRAVENOUS | Status: DC | PRN
  Administered 2023-12-21: 8 ug via INTRAVENOUS

## 2023-12-21 SURGICAL SUPPLY — 62 items
BAG COUNTER SPONGE SURGICOUNT (BAG) ×1 IMPLANT
BLADE MINI RND TIP GREEN BEAV (BLADE) IMPLANT
BNDG COMPR ESMARK 4X3 LF (GAUZE/BANDAGES/DRESSINGS) IMPLANT
BNDG ELASTIC 2X5.8 VLCR STR LF (GAUZE/BANDAGES/DRESSINGS) ×1 IMPLANT
BNDG ELASTIC 3INX 5YD STR LF (GAUZE/BANDAGES/DRESSINGS) ×1 IMPLANT
BNDG ELASTIC 3X5.8 VLCR NS LF (GAUZE/BANDAGES/DRESSINGS) IMPLANT
BNDG ELASTIC 4X5.8 VLCR NS LF (GAUZE/BANDAGES/DRESSINGS) IMPLANT
BNDG ELASTIC 4X5.8 VLCR STR LF (GAUZE/BANDAGES/DRESSINGS) ×1 IMPLANT
BNDG GAUZE DERMACEA FLUFF 4 (GAUZE/BANDAGES/DRESSINGS) ×2 IMPLANT
CHLORAPREP W/TINT 26 (MISCELLANEOUS) ×1 IMPLANT
CORD BIPOLAR FORCEPS 12FT (ELECTRODE) ×1 IMPLANT
COVER BACK TABLE 60X90IN (DRAPES) IMPLANT
COVER MAYO STAND STRL (DRAPES) ×1 IMPLANT
COVER SURGICAL LIGHT HANDLE (MISCELLANEOUS) ×1 IMPLANT
CUFF TOURN SGL QUICK 18X4 (TOURNIQUET CUFF) ×1 IMPLANT
CUFF TRNQT CYL 24X4X16.5-23 (TOURNIQUET CUFF) IMPLANT
DRAIN PENROSE 18X1/4 LTX STRL (DRAIN) IMPLANT
DRAPE HALF SHEET 40X57 (DRAPES) ×1 IMPLANT
DRAPE OEC MINIVIEW 54X84 (DRAPES) IMPLANT
DRAPE SURG 17X23 STRL (DRAPES) ×1 IMPLANT
DRSG ADAPTIC 3X8 NADH LF (GAUZE/BANDAGES/DRESSINGS) ×1 IMPLANT
DRSG XEROFORM 1X8 (GAUZE/BANDAGES/DRESSINGS) IMPLANT
GAUZE SPONGE 4X4 12PLY STRL (GAUZE/BANDAGES/DRESSINGS) ×1 IMPLANT
GAUZE STRETCH 2X75IN STRL (MISCELLANEOUS) IMPLANT
GAUZE XEROFORM 1X8 LF (GAUZE/BANDAGES/DRESSINGS) ×1 IMPLANT
GLOVE BIO SURGEON STRL SZ7 (GLOVE) ×1 IMPLANT
GLOVE BIOGEL PI IND STRL 7.0 (GLOVE) ×1 IMPLANT
GOWN STRL REUS W/ TWL XL LVL3 (GOWN DISPOSABLE) ×2 IMPLANT
IV CATH 18G X1.75 CATHLON (IV SOLUTION) IMPLANT
KIT BASIN OR (CUSTOM PROCEDURE TRAY) ×1 IMPLANT
KIT TURNOVER KIT B (KITS) ×1 IMPLANT
LOOP VASCLR MAXI BLUE 18IN ST (MISCELLANEOUS) IMPLANT
MANIFOLD NEPTUNE II (INSTRUMENTS) IMPLANT
NDL HYPO 25GX1X1/2 BEV (NEEDLE) IMPLANT
NDL HYPO 25X1 1.5 SAFETY (NEEDLE) ×1 IMPLANT
NDL KEITH (NEEDLE) IMPLANT
NEEDLE HYPO 25GX1X1/2 BEV (NEEDLE) IMPLANT
NEEDLE HYPO 25X1 1.5 SAFETY (NEEDLE) ×1 IMPLANT
NEEDLE KEITH (NEEDLE) IMPLANT
NS IRRIG 1000ML POUR BTL (IV SOLUTION) ×1 IMPLANT
PACK ORTHO EXTREMITY (CUSTOM PROCEDURE TRAY) ×1 IMPLANT
PAD ABD 8X10 STRL (GAUZE/BANDAGES/DRESSINGS) ×1 IMPLANT
PAD ARMBOARD POSITIONER FOAM (MISCELLANEOUS) ×1 IMPLANT
PAD CAST 4YDX4 CTTN HI CHSV (CAST SUPPLIES) ×2 IMPLANT
PADDING CAST ABS COTTON 3X4 (CAST SUPPLIES) ×1 IMPLANT
PADDING CAST SYNTHETIC 4X4 STR (CAST SUPPLIES) IMPLANT
SPLINT PLASTER CAST XFAST 3X15 (CAST SUPPLIES) ×1 IMPLANT
SPONGE T-LAP 4X18 ~~LOC~~+RFID (SPONGE) ×1 IMPLANT
SUT ETHILON 4 0 PS 2 18 (SUTURE) IMPLANT
SUTURE FIBERWR 3-0 18 TAPR NDL (SUTURE) IMPLANT
SWAB COLLECTION DEVICE MRSA (MISCELLANEOUS) IMPLANT
SWAB CULTURE ESWAB REG 1ML (MISCELLANEOUS) IMPLANT
SYR 20ML LL LF (SYRINGE) IMPLANT
SYR 30ML LL (SYRINGE) IMPLANT
SYR BULB EAR ULCER 3OZ GRN STR (SYRINGE) ×1 IMPLANT
SYR CONTROL 10ML LL (SYRINGE) IMPLANT
TOWEL GREEN STERILE (TOWEL DISPOSABLE) ×1 IMPLANT
TOWEL GREEN STERILE FF (TOWEL DISPOSABLE) ×2 IMPLANT
TUBE CONNECTING 12X1/4 (SUCTIONS) IMPLANT
UNDERPAD 30X36 HEAVY ABSORB (UNDERPADS AND DIAPERS) ×1 IMPLANT
WATER STERILE IRR 1000ML POUR (IV SOLUTION) ×1 IMPLANT
YANKAUER SUCT BULB TIP NO VENT (SUCTIONS) IMPLANT

## 2023-12-21 NOTE — H&P (Signed)
 History and Physical   Fong Mccarry FMW:[id number] DOB: [birth year] DOA: 12/21/2023  PCP: Theotis Haze ORN, NP   Patient coming from: Home  Chief Complaint: Arm/hand swelling  HPI: Rodney Powell is a 48 y.o. male with medical history significant of hypertension, hyperlipidemia, diabetes presenting with 2 days of right elbow/hand swelling.  Noted to have frequent history of right arm swelling due to history of prior partial tear of his right elbow extensor tendon.  Has not had surgery for this as of yet.  He bumped his hand yesterday and has had increased pain and swelling due to this.  The pain and swelling has been persistent/worsening so he presented to the ED for further evaluation.  Reports some fevers.  Denies chills, chest pain, shortness of breath, abdominal pain, constipation, diarrhea, nausea, vomiting.  ED Course: Vital signs in the ED notable for fever to 100.9, heart rate in the 100s-110s, blood pressure in the 130s-190 systolic, respiratory rate in the teens-20s.  Lab workup included CMP with sodium 134, bicarb 21, BUN 25, creatinine elevated to 2.16 and baseline 1.4, glucose 412, calcium  8.0, protein 5.9, albumin 3.1, alk phos 29.  CBC with hemoglobin of 9.8 which was down from 13 2 years ago, no leukocytosis.  ESR 62, CRP pending.  Lactic acid normal.  Blood cultures pending.  Right hand x-ray showed no osseous findings but dorsal soft tissue swelling.  Case was reviewed with hand surgery and patient went straight to the OR from med center and underwent successful I&D and started on Ancef .  Also received oxycodone , clindamycin , irbesartan , NovoLog  in the ED.  Review of Systems: As per HPI otherwise all other systems reviewed and are negative.  Past Medical History:  Diagnosis Date   Back pain    Diabetes (HCC)    Hypertension     Past Surgical History:  Procedure Laterality Date   INCISION AND DRAINAGE PERIRECTAL ABSCESS Right 01/04/2016   Procedure: IRRIGATION AND  DEBRIDEMENT RIGHT BUTTOCKS  ABSCESS;  Surgeon: Krystal Spinner, MD;  Location: [facility];  Service: General;  Laterality: Right;    Social History  reports that he quit smoking about 2 years ago. His smoking use included cigars and cigarettes. He started smoking about 24 years ago. He has a 22 pack-year smoking history. He has never used smokeless tobacco. He reports that he does not currently use alcohol. He reports that he does not use drugs.  Allergies  Allergen Reactions   Glipizide  Other (See Comments)    Elevated Liver enzymes   Glipizide  Er Other (See Comments)    Elevated Liver enzymes    Family History  Problem Relation Age of Onset   Diabetes Mother    Diabetes Brother    Diabetes Maternal Grandmother   Reviewed on admission  Prior to Admission medications   Medication Sig Start Date End Date Taking? Authorizing Provider  atorvastatin  (LIPITOR) 40 MG tablet Take 1 tablet (40 mg total) by mouth daily. For cholesterol 02/18/23   Fleming, Zelda W, NP  Blood Glucose Monitoring Suppl (ACCU-CHEK GUIDE) w/Device KIT USE  TWICE DAILY TO CHECK BLOOD GLUCOSE LEVEL. USE  AS DIRECTED USE TWICE PER DAY 11/27/22   Delbert Clam, MD  Continuous Blood Gluc Sensor (FREESTYLE LIBRE SENSOR SYSTEM) MISC Change sensor Q 2 wks Patient not taking: Reported on 05/27/2023 03/16/22   Vicci Barnie NOVAK, MD  empagliflozin  (JARDIANCE ) 25 MG TABS tablet Take 1 tablet (25 mg total) by mouth daily before breakfast. 05/27/23   Theotis Haze ORN, NP  gabapentin  (NEURONTIN ) 600 MG tablet Take 0.5 tablets (300 mg total) by mouth 3 (three) times daily. TAKE 1/2 (ONE-HALF) TABLET BY MOUTH THREE TIMES DAILY FOR BACK PAIN 02/18/23   Fleming, Zelda W, NP  insulin  glargine-yfgn (SEMGLEE , YFGN,) 100 UNIT/ML Pen Inject 25 Units into the skin daily. 07/02/23   Newlin, Enobong, MD  Insulin  Pen Needle (PEN NEEDLES) 32G X 4 MM MISC Use to inject Levemir  once a day. 12/26/21   Delbert Clam, MD  Misc. Devices MISC Please provide  patient with insurance approved meter, strips and lancets. E11.65. Strips QTY 200 Lancets QTY 200 with 6 refills. Use as instructed. Check blood glucose level by fingerstick twice per day. 12/25/21   Fleming, Zelda W, NP  valsartan  (DIOVAN ) 160 MG tablet Take 1 tablet (160 mg total) by mouth daily. DOSE CHANGE. STOP VALSARTAN -HCT 05/27/23   Fleming, Zelda W, NP  Vitamin D , Ergocalciferol , (DRISDOL ) 1.25 MG (50000 UNIT) CAPS capsule Take 1 capsule (50,000 Units total) by mouth once a week. 06/09/23   Fleming, Zelda W, NP  metFORMIN  (GLUCOPHAGE ) 500 MG tablet Take 1 tablet (500 mg total) by mouth 2 (two) times daily with a meal. 01/05/16 03/08/20  Billy Junnie HERO, MD    Physical Exam: Vitals:   12/21/23 1258 12/21/23 1324 12/21/23 1519 12/21/23 1531  BP:  (!) 150/91 127/78 (!) 154/99  Pulse:  (!) 103 98 90  Resp:  20 16 17   Temp:  98.4 F (36.9 C) 98.2 F (36.8 C)   TempSrc:  Oral    SpO2:  100% 97% 100%  Weight: 106.6 kg     Height: 5' 7.99 (1.727 m) 5' 7.99 (1.727 m)      Physical Exam Constitutional:      General: He is not in acute distress.    Appearance: Normal appearance.  HENT:     Head: Normocephalic and atraumatic.     Mouth/Throat:     Mouth: Mucous membranes are moist.     Pharynx: Oropharynx is clear.  Eyes:     Extraocular Movements: Extraocular movements intact.     Pupils: Pupils are equal, round, and reactive to light.  Cardiovascular:     Rate and Rhythm: Normal rate and regular rhythm.     Pulses: Normal pulses.     Heart sounds: Normal heart sounds.  Pulmonary:     Effort: Pulmonary effort is normal. No respiratory distress.     Breath sounds: Normal breath sounds.  Abdominal:     General: Bowel sounds are normal. There is no distension.     Palpations: Abdomen is soft.     Tenderness: There is no abdominal tenderness.  Musculoskeletal:        General: No swelling or deformity.     Right lower leg: Edema present.     Left lower leg: Edema (L>R) present.      Comments: Right hand bandaged  Skin:    General: Skin is warm and dry.  Neurological:     General: No focal deficit present.     Mental Status: Mental status is at baseline.           Labs on Admission: I have personally reviewed following labs and imaging studies  CBC: Recent Labs  Lab 12/21/23 0923  WBC 9.6  NEUTROABS 8.2*  HGB 9.8*  HCT 27.5*  MCV 81.1  PLT 175    Basic Metabolic Panel: Recent Labs  Lab 12/21/23 0923  NA 134*  K 4.4  CL 101  CO2  21*  GLUCOSE 412*  BUN 25*  CREATININE 2.16*  CALCIUM  8.0*    GFR: Estimated Creatinine Clearance: 49 mL/min (A) (by C-G formula based on SCr of 2.16 mg/dL (H)).  Liver Function Tests: Recent Labs  Lab 12/21/23 0923  AST 26  ALT 29  ALKPHOS 189*  BILITOT 0.3  PROT 5.9*  ALBUMIN 3.1*    Urine analysis:    Component Value Date/Time   COLORURINE YELLOW 08/13/2019 0223   APPEARANCEUR Clear 05/27/2023 1705   LABSPEC 1.025 08/13/2019 0223   PHURINE 6.0 08/13/2019 0223   GLUCOSEU 3+ (A) 05/27/2023 1705   HGBUR TRACE (A) 08/13/2019 0223   BILIRUBINUR Negative 05/27/2023 1705   KETONESUR NEGATIVE 08/13/2019 0223   PROTEINUR 2+ (A) 05/27/2023 1705   PROTEINUR 30 (A) 08/13/2019 0223   NITRITE Negative 05/27/2023 1705   NITRITE NEGATIVE 08/13/2019 0223   LEUKOCYTESUR Negative 05/27/2023 1705   LEUKOCYTESUR NEGATIVE 08/13/2019 0223    Radiological Exams on Admission: DG Hand Complete Right Result Date: 12/21/2023 EXAM: 3 or more VIEW(S) XRAY OF THE RIGHT HAND 12/21/2023 10:18:00 AM COMPARISON: None available. CLINICAL HISTORY: Trauma. Right arm swelling onset 2 days ago. Reports previous injury to right elbow that causes intermittent swelling, but never this severe. Pt denies injury to hand, only elbow. FINDINGS: BONES AND JOINTS: No acute fracture. No focal osseous lesion. No joint dislocation. SOFT TISSUES: Dorsal soft tissue swelling. Vascular calcifications. IMPRESSION: 1. No acute osseous  abnormality. 2. Dorsal soft tissue swelling, likely related to the reported trauma. Electronically signed by: Lonni Necessary MD 12/21/2023 11:07 AM EDT RP Workstation: [id number]    EKG: Independently reviewed.  Sinus tachycardia at 110 bpm.  Nonspecific T wave changes.  Abnormal R wave progression with early transition.  Assessment/Plan Principal Problem:   Tenosynovitis of right hand Active Problems:   Dyslipidemia   Type 2 diabetes mellitus with hyperglycemia (HCC)   Flexor tenosynovitis of finger   Abscess of finger of right hand   Abscess of bursa of right hand   Cellulitis   Tenosynovitis of right hand Cellulitis > Increased swelling and pain of right hand and arm.  Noted to have erythema, edema, warmth of the right hand and concern for tenosynovitis. > Hand surgery consulted and took patient to the OR for I&D which has been performed.  Started on Ancef . - Monitor on telemetry for now - Appreciate orthopedic/hand surgery recommendations and assistance - Continue with Ancef  - As needed pain control - Nonweightbearing right upper extremity - Supportive care  AKI > Creatinine elevated to 2.16 from baseline of 1.4. - IV fluids, will not be too aggressive with his edema - Trend renal function and electrolytes  Edema > Chronic, for the past several years.  Typically left is greater than right.  Has not been worked up in the past.  No shortness of breath. > ?Venous insufficiency, lower suspicion for CHF but will not be aggressive with fluids.  Hypertension - Hold ARB in the setting of AKI.  Hyperlipidemia - Continue home atorvastatin   Diabetes > 25 units daily at home - 15 units daily - SSI  Anemia > Hemoglobin 9.8 which is down from 13 2 years ago oh.  MCV is borderline microcytic. - Will check iron studies - Trend CBC  DVT prophylaxis: SCDs Code Status:   Full Family Communication:  None on admission  Disposition Plan:   Patient is  from:  Home  Anticipated DC to:  Home  Anticipated DC date:  1 to 3 days  Anticipated DC barriers: None  Consults called:  Orthopedic surgery/hand surgery Admission status:  Observation, telemetry  Severity of Illness: The appropriate patient status for this patient is OBSERVATION. Observation status is judged to be reasonable and necessary in order to provide the required intensity of service to ensure the patient's safety. The patient's presenting symptoms, physical exam findings, and initial radiographic and laboratory data in the context of their medical condition is felt to place them at decreased risk for further clinical deterioration. Furthermore, it is anticipated that the patient will be medically stable for discharge from the hospital within 2 midnights of admission.    Marsa KATHEE Scurry MD [facility]  How to contact the [facility] Attending or Consulting provider 7A - 7P or covering provider during after hours 7P -7A, for this patient?   Check the care team in [facility] and look for a) attending/consulting [facility] provider listed and b) the [facility] team listed Log into [email or web address] and use [place]'s universal password to access. If you do not have the password, please contact the hospital operator. Locate the [facility] provider you are looking for under [facility] and page to a number that you can be directly reached. If you still have difficulty reaching the provider, please page the [facility] (Director on Call) for the Hospitalists listed on amion for assistance.  12/21/2023, 3:43 PM

## 2023-12-21 NOTE — ED Notes (Signed)
 Pt verbalized that he last had solid food at 1030pm last night.

## 2023-12-21 NOTE — ED Notes (Signed)
 Security made aware that pt is going to [employer] and that his car is in the parking lot. Pt has his keys.

## 2023-12-21 NOTE — Consult Note (Signed)
 HAND SURGERY CONSULTATION  REQUESTING PHYSICIAN: No att. providers found   Chief Complaint: Right hand pain and swelling, pain throughout the long finger flexor tendon  HPI: Rodney Powell is a 48 y.o. male who presents with ongoing right hand swelling and associated pain, tracking through the volar aspect of the long finger.  Also has associated pain over the dorsal aspect of the hand.  Symptoms been present now for roughly 24 to 48 hours, worsening in nature. He is diabetic, insulin -dependent.  Past Medical History:  Diagnosis Date   Back pain    Diabetes (HCC)    Hypertension    Past Surgical History:  Procedure Laterality Date   INCISION AND DRAINAGE PERIRECTAL ABSCESS Right 01/04/2016   Procedure: IRRIGATION AND DEBRIDEMENT RIGHT BUTTOCKS  ABSCESS;  Surgeon: Krystal Spinner, MD;  Location: [facility];  Service: General;  Laterality: Right;   Social History   Socioeconomic History   Marital status: Single    Spouse name: Not on file   Number of children: Not on file   Years of education: Not on file   Highest education level: Not on file  Occupational History   Not on file  Tobacco Use   Smoking status: Former    Current packs/day: 0.00    Average packs/day: 1 pack/day for 22.0 years (22.0 ttl pk-yrs)    Types: Cigars, Cigarettes    Start date: 04/02/1999    Quit date: 04/01/2021    Years since quitting: 2.7   Smokeless tobacco: Never  Vaping Use   Vaping status: Never Used  Substance and Sexual Activity   Alcohol use: Not Currently   Drug use: No   Sexual activity: Not Currently    Partners: Female, Male  Other Topics Concern   Not on file  Social History Narrative   Not on file   Social Drivers of Health   Financial Resource Strain: Medium Risk (02/21/2023)   Overall Financial Resource Strain (CARDIA)    Difficulty of Paying Living Expenses: Somewhat hard  Food Insecurity: No Food Insecurity (05/21/2023)   Hunger Vital Sign    Worried About Running Out  of Food in the Last Year: Never true    Ran Out of Food in the Last Year: Never true  Recent Concern: Food Insecurity - Food Insecurity Present (04/17/2023)   Hunger Vital Sign    Worried About Running Out of Food in the Last Year: Sometimes true    Ran Out of Food in the Last Year: Sometimes true  Transportation Needs: No Transportation Needs (05/21/2023)   PRAPARE - Administrator, Civil Service (Medical): No    Lack of Transportation (Non-Medical): No  Physical Activity: Sufficiently Active (02/21/2023)   Exercise Vital Sign    Days of Exercise per Week: 7 days    Minutes of Exercise per Session: 60 min  Stress: No Stress Concern Present (02/21/2023)   [employer] of Occupational Health - Occupational Stress Questionnaire    Feeling of Stress : Not at all  Social Connections: Moderately Isolated (02/21/2023)   Social Connection and Isolation Panel    Frequency of Communication with Friends and Family: Once a week    Frequency of Social Gatherings with Friends and Family: Once a week    Attends Religious Services: 1 to 4 times per year    Active Member of [employer] or Organizations: Yes    Attends Banker Meetings: 1 to 4 times per year    Marital Status: Never  married   Family History  Problem Relation Age of Onset   Diabetes Mother    Diabetes Brother    Diabetes Maternal Grandmother    - negative except otherwise stated in the family history section Allergies  Allergen Reactions   Glipizide  Other (See Comments)    Elevated Liver enzymes   Glipizide  Er Other (See Comments)    Elevated Liver enzymes   Prior to Admission medications   Medication Sig Start Date End Date Taking? Authorizing Provider  atorvastatin  (LIPITOR) 40 MG tablet Take 1 tablet (40 mg total) by mouth daily. For cholesterol 02/18/23   Fleming, Zelda W, NP  Blood Glucose Monitoring Suppl (ACCU-CHEK GUIDE) w/Device KIT USE  TWICE DAILY TO CHECK BLOOD GLUCOSE LEVEL. USE  AS DIRECTED  USE TWICE PER DAY 11/27/22   Delbert Clam, MD  Continuous Blood Gluc Sensor (FREESTYLE LIBRE SENSOR SYSTEM) MISC Change sensor Q 2 wks Patient not taking: Reported on 05/27/2023 03/16/22   Vicci Barnie NOVAK, MD  empagliflozin  (JARDIANCE ) 25 MG TABS tablet Take 1 tablet (25 mg total) by mouth daily before breakfast. 05/27/23   Fleming, Zelda W, NP  gabapentin  (NEURONTIN ) 600 MG tablet Take 0.5 tablets (300 mg total) by mouth 3 (three) times daily. TAKE 1/2 (ONE-HALF) TABLET BY MOUTH THREE TIMES DAILY FOR BACK PAIN 02/18/23   Fleming, Zelda W, NP  insulin  glargine-yfgn (SEMGLEE , YFGN,) 100 UNIT/ML Pen Inject 25 Units into the skin daily. 07/02/23   Newlin, Enobong, MD  Insulin  Pen Needle (PEN NEEDLES) 32G X 4 MM MISC Use to inject Levemir  once a day. 12/26/21   Delbert Clam, MD  Misc. Devices MISC Please provide patient with insurance approved meter, strips and lancets. E11.65. Strips QTY 200 Lancets QTY 200 with 6 refills. Use as instructed. Check blood glucose level by fingerstick twice per day. 12/25/21   Fleming, Zelda W, NP  valsartan  (DIOVAN ) 160 MG tablet Take 1 tablet (160 mg total) by mouth daily. DOSE CHANGE. STOP VALSARTAN -HCT 05/27/23   Fleming, Zelda W, NP  Vitamin D , Ergocalciferol , (DRISDOL ) 1.25 MG (50000 UNIT) CAPS capsule Take 1 capsule (50,000 Units total) by mouth once a week. 06/09/23   Fleming, Zelda W, NP  metFORMIN  (GLUCOPHAGE ) 500 MG tablet Take 1 tablet (500 mg total) by mouth 2 (two) times daily with a meal. 01/05/16 03/08/20  Billy Junnie HERO, MD   DG Hand Complete Right Result Date: 12/21/2023 EXAM: 3 or more VIEW(S) XRAY OF THE RIGHT HAND 12/21/2023 10:18:00 AM COMPARISON: None available. CLINICAL HISTORY: Trauma. Right arm swelling onset 2 days ago. Reports previous injury to right elbow that causes intermittent swelling, but never this severe. Pt denies injury to hand, only elbow. FINDINGS: BONES AND JOINTS: No acute fracture. No focal osseous lesion. No joint dislocation. SOFT  TISSUES: Dorsal soft tissue swelling. Vascular calcifications. IMPRESSION: 1. No acute osseous abnormality. 2. Dorsal soft tissue swelling, likely related to the reported trauma. Electronically signed by: Lonni Necessary MD 12/21/2023 11:07 AM EDT RP Workstation: [id number]   - Positive ROS: All other systems have been reviewed and were otherwise negative with the exception of those mentioned in the HPI and as above.  Physical Exam: General: No acute distress, resting comfortably Cardiovascular: BUE warm and well perfused, normal rate Respiratory: Normal WOB on RA Skin: Warm and dry Neurologic: Sensation intact distally Psychiatric: Patient is at baseline mood and affect  Right upper extremity  - Diffuse swelling throughout the dorsal aspect of the hand and digits, skin is intact, no significant erythema,  no open wounds - Significant pain along the long finger flexor tendon sheath, long finger held in flexed posture with fusiform swelling, pain with passive stretch - Tenderness over the dorsal aspect of the hand - Sensation intact distally, hand remains warm well-perfused - No streaking or erythema of the forearm  Assessment: 48 year old male with right long finger flexor tenosynovitis and associated diffuse right hand swelling  Plan: Based on clinical examination and workup, patient is indicated for right hand irrigation and debridement, incision and drainage of long finger flexor tenosynovitis with associated decompression of the dorsal aspect of hand.  Risks and benefits of the procedure were discussed, risks including but not limited to infection, bleeding, scarring, stiffness, nerve injury, tendon injury, vascular injury, persistent infection, recurrence of symptoms and need for subsequent operation.  We also discussed the appropriate postoperative protocol and timeframe for return to activities and function.  Patient expressed understanding.      Patient will be admitted to  the hospitalist service postoperatively for monitoring and antibiotics.  Will also need appropriate glucose control.  Thank you for the consult and the opportunity to see Rodney Powell Estela) Arlinda, M.D. [facility] 1:15 PM

## 2023-12-21 NOTE — Transfer of Care (Signed)
 Immediate Anesthesia Transfer of Care Note  Patient: Rodney Powell  Procedure(s) Performed: IRRIGATION AND DEBRIDEMENT WOUND (Right: Middle Finger)  Patient Location: PACU  Anesthesia Type:General  Level of Consciousness: awake  Airway & Oxygen Therapy: Patient Spontanous Breathing  Post-op Assessment: Report given to RN and Post -op Vital signs reviewed and stable  Post vital signs: Reviewed and stable  Last Vitals:  Vitals Value Taken Time  BP 127/78 12/21/23 15:17  Temp    Pulse 92 12/21/23 15:20  Resp 25 12/21/23 15:20  SpO2 97 % 12/21/23 15:20  Vitals shown include unfiled device data.  Last Pain:  Vitals:   12/21/23 1324  TempSrc: Oral  PainSc:          Complications: No notable events documented.

## 2023-12-21 NOTE — ED Notes (Signed)
 Called [facility] for transport.

## 2023-12-21 NOTE — Op Note (Addendum)
 NAME: Rodney Powell MEDICAL RECORD NO: [id number] DATE OF BIRTH: 1975/06/10 FACILITY: [facility] LOCATION: [facility] OR PHYSICIAN: GILDARDO ALDERTON, MD   OPERATIVE REPORT   DATE OF PROCEDURE: 12/21/23    PREOPERATIVE DIAGNOSIS: Right hand swelling and pain, concern for underlying flexor tenosynovitis versus dorsal abscess   POSTOPERATIVE DIAGNOSIS: Right hand long finger flexor tenosynovitis with associated dorsal abscess long finger PIP and dorsal hand/wrist seroma   PROCEDURE: Right long finger drainage of tendon sheath for flexor tenosynovitis Right long finger PIP joint arthrotomy for infection with exploration and drainage, interphalangeal joint Incision and drainage of abscess dorsal hand   SURGEON:  GILDARDO ALDERTON, M.D.   ASSISTANT: None   ANESTHESIA:  General   INTRAVENOUS FLUIDS:  Per anesthesia flow sheet.   ESTIMATED BLOOD LOSS:  Minimal.   COMPLICATIONS:  None.   SPECIMENS: Cultures taken from dorsal wound with notable purulence   TOURNIQUET TIME:    Total Tourniquet Time Documented: Upper Arm (Right) - 19 minutes Total: Upper Arm (Right) - 19 minutes    DISPOSITION:  Stable to PACU.   INDICATIONS: 48 year old male who presented with ongoing right hand swelling and pain over the past 48 hours, worsening in nature.  Patient was found to have Knievel signs consistent with flexor tenosynovitis right long finger with associated swelling and pain over the dorsal aspect of the long finger and hand.  Patient was also noted to have systemic symptoms including fever and elevated inflammatory markers.  Patient was indicated for right hand incision and drainage of the long finger tendon sheath, exploration of the dorsal aspect of the long finger and hand for possible abscess.  Risks and benefits of surgery were discussed including the risks of infection, bleeding, scarring, stiffness, nerve injury, vascular injury, tendon injury, need for subsequent operation, persistent infection,  recurrence.  He voiced understanding of these risks and elected to proceed.  OPERATIVE COURSE: Patient was seen and identified in the preoperative area and marked appropriately.  Surgical consent had been signed. Preoperative IV antibiotic prophylaxis was given. He was transferred to the operating room and placed in supine position with the Right upper extremity on an arm board.  General anesthesia was induced by the anesthesiologist.  Right upper extremity was prepped and draped in normal sterile orthopedic fashion.  A surgical pause was performed between the surgeons, anesthesia, and operating room staff and all were in agreement as to the patient, procedure, and site of procedure.  Tourniquet was placed and padded appropriately to the right upper arm.  The arms exsanguinated the tourniquet was inflated to 250 mmHg.  We first began with exploration of the dorsal aspect of the long finger.  Longitudinal incision was performed over the dorsal aspect of the PIP and extended proximally to the P1 region where there was notable pain and swelling.  Purulence was encountered, cultures were taken.  Skin flaps were elevated to expose the underlying extensor apparatus.  Extensor apparatus was elevated and gently mobilized, peritendinous approach was utilized to access the PIP joint.  Arthrotomy was performed at the PIP joint for exploration purposes, no significant purulence was encountered within the joint.  Copious irrigation was performed.  Capsular tissue was then closed atraumatically.  We then turned our attention to the dorsal aspect of the hand.  Longitudinal incision was performed at the midline of the dorsal hand in line with the long finger.  Large seroma fluid collection was encountered and drained to hand and wrist level, minimal purulence was encountered at the dorsal  aspect of the hand and wrist.  Notable decompression of the soft tissue of the hand and wrist was appreciated after dorsal incision and  decompression.  Soft tissue tension was improved drastically.  We then turned our attention to the volar aspect of the long finger.  Brunner style incision was designed at the A1 pulley level.  Skin flaps were elevated, digital neurovascular structures were carefully protected to expose the underlying A1 pulley.  A1 pulley was sharply incised utilizing a Beaver blade, proximal portion of the A2 pulley was also released taking care not to release more than 25% of the pulley.  Additional incision was performed in transverse fashion at the A5 level.  Skin flaps were elevated to expose the A5 pulley which was also vented utilizing a Beaver blade.  18-gauge Angiocath was then inserted in antegrade fashion underneath the A1 pulley region and into the flexor tendon sheath, copious irrigation was utilized via the Angiocath, direct communication was observed at the A5 pulley region.  Mild purulence was encountered throughout the flexor tendon sheath, after copious irrigation, there was no remaining purulent fluid.  At this juncture, the tourniquet was deflated at 19 minutes.  Fingertips were pink with brisk capillary refill after deflation of tourniquet.  Bipolar electrocautery was utilized for hemostasis.  Copes irrigation was once again performed.  Wounds were closed in simple standard fashion utilizing 4-0 nylon.  Sterile dressings were applied followed by application of a soft hand wrap.  The operative drapes were broken down.  The patient was awoken from anesthesia safely and taken to PACU in stable condition.   Post-operative plan: The patient will recover in the post-anesthesia care unit and then be admitted to the hospitalist service.  The patient will be non weight bearing on the right upper extremity in a soft dressing.   Patient will be followed while in house, likely can be discharged on postoperative day 1.  He will receive postoperative antibiotics.  Cultures will be followed.  Follow-up in outpatient  setting in 1 week for wound check.   Nedda Gains, MD Electronically signed, 12/21/23

## 2023-12-21 NOTE — Anesthesia Procedure Notes (Signed)
 Procedure Name: LMA Insertion Date/Time: 12/21/2023 2:12 PM  Performed by: Virgil Ee, CRNAPre-anesthesia Checklist: Patient identified, Patient being monitored, Timeout performed, Emergency Drugs available and Suction available Patient Re-evaluated:Patient Re-evaluated prior to induction Oxygen Delivery Method: Circle system utilized Preoxygenation: Pre-oxygenation with 100% oxygen Induction Type: IV induction Ventilation: Mask ventilation without difficulty LMA: LMA inserted LMA Size: 4.0 Tube type: Oral Number of attempts: 1 Placement Confirmation: positive ETCO2 and breath sounds checked- equal and bilateral Tube secured with: Tape Dental Injury: Teeth and Oropharynx as per pre-operative assessment

## 2023-12-21 NOTE — Plan of Care (Signed)
 Discussed patient with emergency dept provider. Concern for right hand flexor tenosynovitis in setting of poorly controlled DM.   Ongoing swelling and fever reported, pain with passive stretch of right long finger, tenderness along flexor sheath long finger, flexed posture  Patient indicated for R hand I&D flexor tenosynovitis. Will make arrangements for transfer to [facility].  Keep NPO.  Mauricio Dahlen, MD Hand Surgery, [place]

## 2023-12-21 NOTE — ED Provider Notes (Cosign Needed Addendum)
 [place] [facility] AT [facility] Provider Note   CSN: [id number] Arrival date & time: 12/21/23  0900     Patient presents with: Arm Swelling and Ear Fullness   Rodney Powell is a 48 y.o. male.   48 y.o male with a PMH of HTN, DM, right elbow epicondylitis presents to the ED with a chief complaint of right elbow swelling x 2 days ago. Patient states his right arm always swells, he has a previous diagnoses of partial tear of common extensor tendon of right elbow, which he has not been able to get surgery for.  However, he tells me that yesterday he bumped his right hand onto a table, reports worsening swelling to the dorsum aspect of his hand exacerbated with any kind of range of motion such as flexion and extension.  There is significant swelling to the right middle digit, where a small wound is also noted at the top.  He states that this has been there for a couple of days.  He has not taken any medication for improvement in his symptoms.  He denies any fevers, chills, other complaints reported.   The history is provided by the patient.       Prior to Admission medications   Medication Sig Start Date End Date Taking? Authorizing Provider  atorvastatin  (LIPITOR) 40 MG tablet Take 1 tablet (40 mg total) by mouth daily. For cholesterol 02/18/23   Fleming, Zelda W, NP  Blood Glucose Monitoring Suppl (ACCU-CHEK GUIDE) w/Device KIT USE  TWICE DAILY TO CHECK BLOOD GLUCOSE LEVEL. USE  AS DIRECTED USE TWICE PER DAY 11/27/22   Delbert Clam, MD  Continuous Blood Gluc Sensor (FREESTYLE LIBRE SENSOR SYSTEM) MISC Change sensor Q 2 wks Patient not taking: Reported on 05/27/2023 03/16/22   Vicci Barnie NOVAK, MD  empagliflozin  (JARDIANCE ) 25 MG TABS tablet Take 1 tablet (25 mg total) by mouth daily before breakfast. 05/27/23   Fleming, Zelda W, NP  gabapentin  (NEURONTIN ) 600 MG tablet Take 0.5 tablets (300 mg total) by mouth 3 (three) times daily. TAKE 1/2 (ONE-HALF) TABLET BY  MOUTH THREE TIMES DAILY FOR BACK PAIN 02/18/23   Fleming, Zelda W, NP  insulin  glargine-yfgn (SEMGLEE , YFGN,) 100 UNIT/ML Pen Inject 25 Units into the skin daily. 07/02/23   Newlin, Enobong, MD  Insulin  Pen Needle (PEN NEEDLES) 32G X 4 MM MISC Use to inject Levemir  once a day. 12/26/21   Delbert Clam, MD  Misc. Devices MISC Please provide patient with insurance approved meter, strips and lancets. E11.65. Strips QTY 200 Lancets QTY 200 with 6 refills. Use as instructed. Check blood glucose level by fingerstick twice per day. 12/25/21   Fleming, Zelda W, NP  valsartan  (DIOVAN ) 160 MG tablet Take 1 tablet (160 mg total) by mouth daily. DOSE CHANGE. STOP VALSARTAN -HCT 05/27/23   Theotis Haze ORN, NP  Vitamin D , Ergocalciferol , (DRISDOL ) 1.25 MG (50000 UNIT) CAPS capsule Take 1 capsule (50,000 Units total) by mouth once a week. 06/09/23   Fleming, Zelda W, NP  metFORMIN  (GLUCOPHAGE ) 500 MG tablet Take 1 tablet (500 mg total) by mouth 2 (two) times daily with a meal. 01/05/16 03/08/20  Billy Junnie HERO, MD    Allergies: Glipizide  and Glipizide  er    Review of Systems  Constitutional:  Negative for chills and fever.  Respiratory:  Negative for shortness of breath.   Cardiovascular:  Negative for chest pain.  Gastrointestinal:  Negative for abdominal pain, nausea and vomiting.  Musculoskeletal:  Positive for arthralgias and joint  swelling. Negative for back pain.  All other systems reviewed and are negative.   Updated Vital Signs BP (!) 137/100   Pulse (!) 111   Temp 99.4 F (37.4 C) (Oral)   Resp (!) 25   Ht 5' 8 (1.727 m)   Wt 106.6 kg   SpO2 99%   BMI 35.73 kg/m   Physical Exam Vitals and nursing note reviewed.  Constitutional:      Appearance: Normal appearance.  HENT:     Head: Normocephalic and atraumatic.     Mouth/Throat:     Mouth: Mucous membranes are moist.  Cardiovascular:     Rate and Rhythm: Normal rate.     Pulses:          Radial pulses are 2+ on the right side.   Pulmonary:     Effort: Pulmonary effort is normal.  Abdominal:     General: Abdomen is flat.  Musculoskeletal:     Right hand: Swelling and tenderness present. Decreased range of motion. Decreased strength of finger abduction. Normal sensation. Normal pulse.     Cervical back: Normal range of motion and neck supple.     Comments: Significant swelling to the right middle digit, with some surrounding erythema. 2+ radial pulses with limited ROM due to pain   Skin:    General: Skin is warm and dry.     Findings: Erythema present.  Neurological:     Mental Status: He is alert and oriented to person, place, and time.          (all labs ordered are listed, but only abnormal results are displayed) Labs Reviewed  CBC WITH DIFFERENTIAL/PLATELET - Abnormal; Notable for the following components:      Result Value   RBC 3.39 (*)    Hemoglobin 9.8 (*)    HCT 27.5 (*)    Neutro Abs 8.2 (*)    All other components within normal limits  COMPREHENSIVE METABOLIC PANEL WITH GFR - Abnormal; Notable for the following components:   Sodium 134 (*)    CO2 21 (*)    Glucose, Bld 412 (*)    BUN 25 (*)    Creatinine, Ser 2.16 (*)    Calcium  8.0 (*)    Total Protein 5.9 (*)    Albumin 3.1 (*)    Alkaline Phosphatase 189 (*)    GFR, Estimated 37 (*)    All other components within normal limits  SEDIMENTATION RATE - Abnormal; Notable for the following components:   Sed Rate 62 (*)    All other components within normal limits  CULTURE, BLOOD (ROUTINE X 2)  CULTURE, BLOOD (ROUTINE X 2)  LACTIC ACID, PLASMA  C-REACTIVE PROTEIN    EKG: EKG Interpretation Date/Time:  Saturday December 21 2023 09:39:06 EDT Ventricular Rate:  110 PR Interval:  155 QRS Duration:  92 QT Interval:  316 QTC Calculation: 428 R Axis:   46  Text Interpretation: Sinus tachycardia Abnormal R-wave progression, early transition Borderline T wave abnormalities No significant change since last tracing Confirmed by  Dreama Longs ([id number]) on 12/21/2023 9:43:18 AM  Radiology: [facility] Hand Complete Right Result Date: 12/21/2023 EXAM: 3 or more VIEW(S) XRAY OF THE RIGHT HAND 12/21/2023 10:18:00 AM COMPARISON: None available. CLINICAL HISTORY: Trauma. Right arm swelling onset 2 days ago. Reports previous injury to right elbow that causes intermittent swelling, but never this severe. Pt denies injury to hand, only elbow. FINDINGS: BONES AND JOINTS: No acute fracture. No focal osseous lesion. No joint dislocation. SOFT  TISSUES: Dorsal soft tissue swelling. Vascular calcifications. IMPRESSION: 1. No acute osseous abnormality. 2. Dorsal soft tissue swelling, likely related to the reported trauma. Electronically signed by: Lonni Necessary MD 12/21/2023 11:07 AM EDT RP Workstation: [id number]     Procedures   Medications Ordered in the ED  irbesartan  (AVAPRO ) tablet 150 mg (150 mg Oral Given 12/21/23 1056)  oxyCODONE -acetaminophen  (PERCOCET/ROXICET) 5-325 MG per tablet 1 tablet (1 tablet Oral Given 12/21/23 0951)  clindamycin  (CLEOCIN ) IVPB 600 mg (0 mg Intravenous Stopped 12/21/23 1115)  insulin  aspart (novoLOG ) injection 8 Units (8 Units Subcutaneous Given 12/21/23 1142)    Clinical Course as of 12/21/23 1229  Sat Dec 21, 2023  1051 Creatinine(!): 2.16 [JS]  1131 Sed Rate(!): 62 [JS]    Clinical Course User Index [JS] Jamie-Lee Galdamez, PA-C                                 Medical Decision Making Amount and/or Complexity of Data Reviewed Labs: ordered. Decision-making details documented in ED Course. Radiology: ordered.  Risk Prescription drug management.   This patient presents to the ED for concern of right hand swelling, this involves a number of treatment options, and is a complaint that carries with it a high risk of complications and morbidity.  The differential diagnosis includes trauma, infection versus gout.    Co morbidities: Discussed in HPI   Brief History:  See HPI.   EMR reviewed  including pt PMHx, past surgical history and past visits to ER.   See HPI for more details   Lab Tests:  I ordered and independently interpreted labs.  The pertinent results include:    CBC with no leukocytosis, hemoglobin is decreased from his baseline.  CMP remarkable for elevated glucose of 412, underlying diabetes but reports compliance with medication.  Creatinine remarkable for an AKI with a new creatinine level of 2.16.  According to prior records, this was only 1 approximately 10 months ago.  GFR is also low at 37.  Lactic acid was negative.   Imaging Studies:  IMPRESSION:  1. No acute osseous abnormality.  2. Dorsal soft tissue swelling, likely related to the reported trauma.   Cardiac Monitoring:  The patient was maintained on a cardiac monitor.  I personally viewed and interpreted the cardiac monitored which showed an underlying rhythm of: Sinus tachycardia  EKG non-ischemic   Medicines ordered:  I ordered medication including oxycodone  for pain control Reevaluation of the patient after these medicines showed that the patient improved I have reviewed the patients home medicines and have made adjustments as needed  Critical Interventions:   Patient started on IV clindamycin  in order to treat cellulitis of the right hand.  Blood cultures were sent off.  Consults:  I requested consultation with hand surgery Dr. Arlinda,  and discussed lab and imaging findings as well as pertinent plan - they recommend: Keeping patient n.p.o., admit to the hospitalist service will take him for a washout today.  Reevaluation:  After the interventions noted above I re-evaluated patient and found that they have :stayed the same   Social Determinants of Health:  The patient's social determinants of health were a factor in the care of this patient   Problem List / ED Course:  Patient here with 1 day of right hand swelling which occurred after bumping into a table.  He tells me  that he has not taken anything for improvement in symptoms.  There is a small wound noted to the right middle digit, the skin here appears erythematous and swollen with limited range of motion of flexion and extension of the right hand.  Pulses are intact. I extensively reviewed his records and see that he does have a prior history of right epicondylitis, due to a partial tear OF common extensor tendon of right elbow which she has not had any intervention for as of today due to insurance reasons. Patient states that the swelling of his right arm does occur on and off, however the swelling seems to be localized to the right hand.  Strong suspicion for infection with skin changes consistent with cellulitis. Labs are remarkable for new AKI with a creatinine of 2.1, compared to 1.4 approximately 10 months ago.  eGFR is also worsened than before.  CBC does not have any leukocytosis, but concerning for low hemoglobin than his baseline.  Lactic acid is negative. Glucose 414, given 8 units of insulin  while in the ED. Also, reports non compliance with BP meds.  According to his last visit with his PCP he was prescribed losartan, given this during his ED stay.  He was given Percocet to help with pain control, along with IV clindamycin  to help treat his cellulitis. Some concern for tendon involvement as there is pain with passive and active ROM of the middle digit along with erythema, therefore consulted hand surgery.  Patient is n.p.o. at this time his last oral intake was yesterday at 10:30 PM consisting of a sandwich.  I did speak to hand surgery team who feels that patient warrants likely washout at this time.  Will transfer patient to the OR at this time, will also call hospitalist for patient to have placed for disposition. I attempted to consult the hospitalist multiple times, I was unable to obtain any callbacks from them.  Patient will need admission as he is headed to the OR with Dr. Arlinda. He remained  hemodynamically stable under my care.  Spoke to Dr. Melvin, who reports patient will likely be paged out after surgical intervention. They are aware of patient at this time.   Dispostion:  After consideration of the diagnostic results and the patients response to treatment, I feel that the patent would benefit from OR intervention, along with admission for further management of his problem.   Portions of this note were generated with Scientist, clinical (histocompatibility and immunogenetics). Dictation errors may occur despite best attempts at proofreading.   Final diagnoses:  Swelling of right hand  Cellulitis of finger of right hand  AKI (acute kidney injury) [facility])    ED Discharge Orders     None          Maureen Broad, PA-C 12/21/23 1227    Maureen Broad, PA-C 12/21/23 1229    Dreama Longs, MD 12/22/23 [phone]

## 2023-12-21 NOTE — ED Triage Notes (Signed)
 Right arm swelling onset 2 days ago. Reports previous injury to right elbow that causes intermittent swelling, but never this severe.   Also c/o left ear fullness

## 2023-12-21 NOTE — Anesthesia Preprocedure Evaluation (Signed)
 Anesthesia Evaluation  Patient identified by MRN, date of birth, ID band Patient awake    Reviewed: Allergy & Precautions, NPO status , Patient's Chart, lab work & pertinent test results, reviewed documented beta blocker date and time   Airway Mallampati: II  TM Distance: >3 FB     Dental no notable dental hx.    Pulmonary neg shortness of breath, neg pneumonia , neg recent URI, former smoker   breath sounds clear to auscultation       Cardiovascular hypertension, (-) angina (-) CAD and (-) Past MI  Rhythm:Regular Rate:Normal     Neuro/Psych neg Seizures  Neuromuscular disease    GI/Hepatic ,neg GERD  ,,(+) neg Cirrhosis        Endo/Other  diabetes, Poorly Controlled, Type 2    Renal/GU CRF and ARFRenal disease     Musculoskeletal   Abdominal   Peds  Hematology   Anesthesia Other Findings   Reproductive/Obstetrics                              Anesthesia Physical Anesthesia Plan  ASA: 3  Anesthesia Plan: General   Post-op Pain Management:    Induction: Intravenous  PONV Risk Score and Plan: 2 and Ondansetron   Airway Management Planned: LMA  Additional Equipment:   Intra-op Plan:   Post-operative Plan: Extubation in OR  Informed Consent: I have reviewed the patients History and Physical, chart, labs and discussed the procedure including the risks, benefits and alternatives for the proposed anesthesia with the patient or authorized representative who has indicated his/her understanding and acceptance.     Dental advisory given  Plan Discussed with: CRNA  Anesthesia Plan Comments:          Anesthesia Quick Evaluation

## 2023-12-21 NOTE — ED Notes (Signed)
 Spoke with [place] at [facility] regarding Hospitalist consult . [facility] here to transport to short stay

## 2023-12-22 ENCOUNTER — Other Ambulatory Visit (HOSPITAL_COMMUNITY): Payer: Self-pay

## 2023-12-22 LAB — COMPREHENSIVE METABOLIC PANEL WITH GFR
ALT: 18 U/L (ref 0–44)
AST: 22 U/L (ref 15–41)
Albumin: 2 g/dL — ABNORMAL LOW (ref 3.5–5.0)
Alkaline Phosphatase: 146 U/L — ABNORMAL HIGH (ref 38–126)
Anion gap: 10 (ref 5–15)
BUN: 23 mg/dL — ABNORMAL HIGH (ref 6–20)
CO2: 22 mmol/L (ref 22–32)
Calcium: 7.4 mg/dL — ABNORMAL LOW (ref 8.9–10.3)
Chloride: 105 mmol/L (ref 98–111)
Creatinine, Ser: 2.19 mg/dL — ABNORMAL HIGH (ref 0.61–1.24)
GFR, Estimated: 36 mL/min — ABNORMAL LOW (ref >60)
Glucose, Bld: 152 mg/dL — ABNORMAL HIGH (ref 70–99)
Potassium: 4.5 mmol/L (ref 3.5–5.1)
Sodium: 137 mmol/L (ref 135–145)
Total Bilirubin: 0.5 mg/dL (ref 0.0–1.2)
Total Protein: 5.2 g/dL — ABNORMAL LOW (ref 6.5–8.1)

## 2023-12-22 LAB — CBC
HCT: 26.2 % — ABNORMAL LOW (ref 39.0–52.0)
Hemoglobin: 9.1 g/dL — ABNORMAL LOW (ref 13.0–17.0)
MCH: 28.8 pg (ref 26.0–34.0)
MCHC: 34.7 g/dL (ref 30.0–36.0)
MCV: 82.9 fL (ref 80.0–100.0)
Platelets: 167 K/uL (ref 150–400)
RBC: 3.16 MIL/uL — ABNORMAL LOW (ref 4.22–5.81)
RDW: 13.2 % (ref 11.5–15.5)
WBC: 10 K/uL (ref 4.0–10.5)
nRBC: 0 % (ref 0.0–0.2)

## 2023-12-22 LAB — HIV ANTIBODY (ROUTINE TESTING W REFLEX): HIV Screen 4th Generation wRfx: NONREACTIVE

## 2023-12-22 LAB — IRON AND TIBC
Iron: 13 ug/dL — ABNORMAL LOW (ref 45–182)
Saturation Ratios: 8 % — ABNORMAL LOW (ref 17.9–39.5)
TIBC: 165 ug/dL — ABNORMAL LOW (ref 250–450)
UIBC: 152 ug/dL

## 2023-12-22 LAB — GLUCOSE, CAPILLARY
Glucose-Capillary: 130 mg/dL — ABNORMAL HIGH (ref 70–99)
Glucose-Capillary: 107 mg/dL — ABNORMAL HIGH (ref 70–99)
Glucose-Capillary: 107 mg/dL — ABNORMAL HIGH (ref 70–99)

## 2023-12-22 LAB — HEMOGLOBIN A1C
Hgb A1c MFr Bld: 9.2 % — ABNORMAL HIGH (ref 4.8–5.6)
Mean Plasma Glucose: 217.34 mg/dL

## 2023-12-22 LAB — FERRITIN: Ferritin: 369 ng/mL — ABNORMAL HIGH (ref 24–336)

## 2023-12-22 MED ORDER — HYDROCODONE-ACETAMINOPHEN 5-325 MG PO TABS
1.0000 | ORAL_TABLET | Freq: Four times a day (QID) | ORAL | 0 refills | Status: AC | PRN
  Filled 2023-12-22: qty 30, 8d supply, fill #0

## 2023-12-22 MED ORDER — FREESTYLE LIBRE SENSOR SYSTEM MISC: 12 refills | Status: AC

## 2023-12-22 MED ORDER — INSULIN GLARGINE-YFGN 100 UNIT/ML ~~LOC~~ SOPN
25.0000 [IU] | PEN_INJECTOR | Freq: Every day | SUBCUTANEOUS | 0 refills | Status: DC
  Filled 2023-12-22: qty 15, 60d supply, fill #0

## 2023-12-22 MED ORDER — POLYETHYLENE GLYCOL 3350 17 GM/SCOOP PO POWD
17.0000 g | Freq: Every day | ORAL | 0 refills | Status: AC | PRN
  Filled 2023-12-22: qty 238, 14d supply, fill #0

## 2023-12-22 MED ORDER — AMOXICILLIN-POT CLAVULANATE 875-125 MG PO TABS
1.0000 | ORAL_TABLET | Freq: Two times a day (BID) | ORAL | 0 refills | Status: AC
  Filled 2023-12-22: qty 14, 7d supply, fill #0

## 2023-12-22 MED ORDER — IRBESARTAN 150 MG PO TABS
150.0000 mg | ORAL_TABLET | Freq: Every day | ORAL | 0 refills | Status: AC
  Filled 2023-12-22: qty 30, 30d supply, fill #0

## 2023-12-22 MED ORDER — ATORVASTATIN CALCIUM 40 MG PO TABS
40.0000 mg | ORAL_TABLET | Freq: Every day | ORAL | 0 refills | Status: AC
  Filled 2023-12-22: qty 30, 30d supply, fill #0

## 2023-12-22 MED ORDER — INSULIN GLARGINE 100 UNIT/ML SOLOSTAR PEN
25.0000 [IU] | PEN_INJECTOR | Freq: Every day | SUBCUTANEOUS | 11 refills | Status: AC
  Filled 2023-12-22: qty 6, 24d supply, fill #0

## 2023-12-22 MED ORDER — EMPAGLIFLOZIN 25 MG PO TABS
25.0000 mg | ORAL_TABLET | Freq: Every day | ORAL | 0 refills | Status: AC
  Filled 2023-12-22: qty 30, 30d supply, fill #0

## 2023-12-22 MED ORDER — PEN NEEDLES 32G X 4 MM MISC
2 refills | Status: AC
  Filled 2023-12-22: qty 100, 100d supply, fill #0

## 2023-12-22 NOTE — Progress Notes (Signed)
 Patient expressed that he does not have insurance that will cover medications for diabetes and hypertension. Notified Dr. Brigida Bureau, Chrystal Land, LCSW and case management Veva Angles, RN of patient's situation and need for appropriate resources prior to discharge.

## 2023-12-22 NOTE — Plan of Care (Signed)
  Problem: Education: Goal: Ability to describe self-care measures that may prevent or decrease complications (Diabetes Survival Skills Education) will improve Outcome: Progressing   Problem: Education: Goal: Knowledge of General Education information will improve Description: Including pain rating scale, medication(s)/side effects and non-pharmacologic comfort measures Outcome: Progressing   Problem: Activity: Goal: Risk for activity intolerance will decrease Outcome: Progressing   Problem: Elimination: Goal: Will not experience complications related to urinary retention Outcome: Progressing   Problem: Safety: Goal: Ability to remain free from injury will improve Outcome: Progressing

## 2023-12-22 NOTE — Progress Notes (Signed)
   Rodney Powell - 48 y.o. male MRN [id number]  Date of birth: [birth year]    Subjective:  Resting comfortably, having some ongoing throbbing pain at the right hand.  Dressing remains intact.  Objective:   VITALS:   Vitals:   12/21/23 2359 12/22/23 0418 12/22/23 0500 12/22/23 0747  BP: (!) 160/88 (!) 163/87  (!) 155/91  Pulse: (!) 109   (!) 109  Resp: 18 16  16   Temp: 98.6 F (37 C) 99.9 F (37.7 C) 98.4 F (36.9 C) 99.4 F (37.4 C)  TempSrc:  Oral Oral Oral  SpO2: 100% 99%  97%  Weight:      Height:        Gen: Awake and alert Right upper extremity: - Dressing intact, remains clean and dry, no evidence of drainage or saturation - Hand is warm well-perfused, sensation intact distally    Lab Results  Component Value Date   WBC 10.0 12/22/2023   HGB 9.1 (L) 12/22/2023   HCT 26.2 (L) 12/22/2023   MCV 82.9 12/22/2023   PLT 167 12/22/2023     Assessment/Plan:  1 Day Post-Op right hand long finger flexor tenosynovitis and dorsal abscess to the PIP and dorsal hand status post incision and drainage  - Extensive discussion had with the patient today, instructed to maintain dressing until hand surgical follow-up in 1 week - Emphasize importance of diabetic control moving forward to prevent recurrent infection and help with wound healing - Okay for discharge from hand surgical standpoint if medically cleared - Would recommend home with course of oral antibiotics, Augmentin  for 1 week  Burton Gahan, MD [place], [facility] 12/22/2023, 7:56 AM

## 2023-12-22 NOTE — Progress Notes (Signed)
 Rodney Powell to be D/C'd  per MD order.  Discussed with the patient and all questions fully answered.  VSS, Skin clean, dry and intact without evidence of skin break down, no evidence of skin tears noted.  IV catheter discontinued intact. Site without signs and symptoms of complications. Dressing and pressure applied.  An After Visit Summary was printed and given to the patient. Patient received prescription from [facility].   D/c education completed with patient/family including follow up instructions, medication list, d/c activities limitations if indicated, with other d/c instructions as indicated by MD - patient able to verbalize understanding, all questions fully answered.   Patient instructed to return to ED, call 911, or call MD for any changes in condition.   Patient to be escorted via WC, and D/C home via private auto.

## 2023-12-22 NOTE — Plan of Care (Signed)

## 2023-12-22 NOTE — Discharge Summary (Signed)
 Physician Discharge Summary   Patient: Rodney Powell MRN: [id number] DOB: Oct 18, 1975  Admit date:     12/21/2023  Discharge date: 12/22/23  Discharge Physician: Brigida Bureau   PCP: Theotis Haze ORN, NP   Recommendations at discharge:    Discharge to home Follow up with hand surgeon in one week. Complete augmentin  Leave bandage in place until follow up with surgeon. Follow up with PCP in one week. Chemistry to be drawn on that visit.  Discharge Diagnoses: Principal Problem:   Tenosynovitis of right hand Active Problems:   Dyslipidemia   Type 2 diabetes mellitus with hyperglycemia (HCC)   Flexor tenosynovitis of finger   Abscess of finger of right hand   Abscess of bursa of right hand   Cellulitis  Resolved Problems:   * No resolved hospital problems. [facility] Course: The patient was admitted on 12/21/2023 after presenting to the [facility] ED with complaints of right hand/arm swelling x 2 days. The patient was reviewed with hand surgery and was taken directly from [facility] to the OR with Dr. Arlinda for I & D. The patient received IV ancef  for cellulitis and concern for tenosynovitis. The patient tolerated the procedure well.  On 12/22/2023 the patient was seen again by hand surgery. They have cleared the patient for discharge to home. The patient is to follow up with hand surgery in one week.  Although the patient carries a Dx of diabetes, hypertension, and hyperlipidemia, he has not been taking his medications at home, because he cannot afford them. The patient will be represcribed these medications on discharge as it is critical that the patient's has good glucose control to have good wound healing. These will be given to the patient from the [facility] pharmacy here at [facility] to ensure that he has good glucose control while he is healing from this surgery.  He is released to return to work in one week after he follows up with surgery.  Assessment and  Plan:  Assessment/Plan Principal Problem:   Tenosynovitis of right hand Active Problems:   Dyslipidemia   Type 2 diabetes mellitus with hyperglycemia (HCC)   Flexor tenosynovitis of finger   Abscess of finger of right hand   Abscess of bursa of right hand   Cellulitis   Tenosynovitis of right hand Cellulitis > Increased swelling and pain of right hand and arm.  Noted to have erythema, edema, warmth of the right hand and concern for tenosynovitis. > Hand surgery consulted and took patient to the OR for I&D which has been performed.  Started on Ancef . - The patient will be discharged to home on augmentin  x 7 days per surgery's recommendation. - He will follow up with hand surgery in one week. - As needed pain control - Nonweightbearing right upper extremity - Supportive care   AKI > Creatinine elevated to 2.19 from baseline of 1.4. - IV fluids, will not be too aggressive with his edema - Patient will need to follow up with PCP about this.   Edema > Chronic, for the past several years.  Typically left is greater than right.  Has not been worked up in the past.  No shortness of breath. > ?Venous insufficiency, lower suspicion for CHF but will not be aggressive with fluids.   Hypertension - Hold ARB in the setting of AKI.   Hyperlipidemia - Continue home atorvastatin    Diabetes > 25 units of lantus  daily at home. This medication as well as sensor, and pen  needles will be prescribed through the [facility] pharmacy to ensure that the patient is able to appropriately care for his diabetes while healing.   Anemia > Hemoglobin 9.8 which is down from 13 2 years ago oh.  MCV is borderline microcytic. - Will check iron studies - Trend CBC   DVT prophylaxis:      SCDs Code Status:              Full Family Communication:       None on admission  Disposition Plan:              Patient is from:                        Home             Anticipated DC to:                   Home              Anticipated DC date:               1 to 3 days             Anticipated DC barriers:         None    Consults called:        Orthopedic surgery/hand surgery Admission status:     Observation, telemetry         Consultants: Hand surgery. Procedures performed: I & D  Disposition: Home Diet recommendation:  Discharge Diet Orders (From admission, onward)     Start     Ordered   12/22/23 0000  Diet - low sodium heart healthy        12/22/23 1159   12/22/23 0000  Diet Carb Modified        12/22/23 1159           Cardiac and Carb modified diet DISCHARGE MEDICATION: Allergies as of 12/22/2023       Reactions   Glipizide  Other (See Comments)   Elevated Liver enzymes   Glipizide  Er Other (See Comments)   Elevated Liver enzymes        Medication List     STOP taking these medications    Accu-Chek Guide w/Device Kit   gabapentin  600 MG tablet Commonly known as: NEURONTIN    insulin  glargine-yfgn 100 UNIT/ML Pen Commonly known as: Semglee  (yfgn)   valsartan  160 MG tablet Commonly known as: DIOVAN    Vitamin D  (Ergocalciferol ) 1.25 MG (50000 UNIT) Caps capsule Commonly known as: DRISDOL        TAKE these medications    amoxicillin -clavulanate 875-125 MG tablet Commonly known as: AUGMENTIN  Take 1 tablet by mouth 2 (two) times daily for 7 days.   atorvastatin  40 MG tablet Commonly known as: LIPITOR Take 1 tablet (40 mg total) by mouth daily. For cholesterol   FreeStyle Libre Sensor System Misc Change sensor Q 2 wks   HYDROcodone -acetaminophen  5-325 MG tablet Commonly known as: NORCO/VICODIN Take 1 tablet by mouth every 6 (six) hours as needed for moderate pain (pain score 4-6) or severe pain (pain score 7-10).   irbesartan  150 MG tablet Commonly known as: AVAPRO  Take 1 tablet (150 mg total) by mouth daily. Start taking on: December 23, 2023   Jardiance  25 MG Tabs tablet Generic drug: empagliflozin  Take 1 tablet (25 mg total) by mouth daily before  breakfast.   Lantus  SoloStar 100 UNIT/ML Solostar Pen Generic drug: insulin  glargine  Inject 25 Units into the skin daily. Start taking on: December 23, 2023   Misc. Devices Misc Please provide patient with insurance approved meter, strips and lancets. E11.65. Strips QTY 200 Lancets QTY 200 with 6 refills. Use as instructed. Check blood glucose level by fingerstick twice per day.   polyethylene glycol powder 17 GM/SCOOP powder Commonly known as: GLYCOLAX /MIRALAX  Dissolve 1 capful (17g) in 4-8 ounces of liquid and take by mouth daily as needed for mild constipation   TechLite Pen Needles 32G X 4 MM Misc Generic drug: Insulin  Pen Needle Use to inject insulin  once a day. What changed: additional instructions               Discharge Care Instructions  (From admission, onward)           Start     Ordered   12/22/23 0000  Leave dressing on - Keep it clean, dry, and intact until clinic visit       Comments: Cover hand with plastic to prevent it from getting wet in shower etc.   12/22/23 1159            Follow-up Information     Agarwala, Anshul, MD. Schedule an appointment as soon as possible for a visit in 1 week(s).   Specialty: Orthopedic Surgery Why: For wound re-check Contact information: [address]  [place] [address] [phone]                Discharge Exam: Filed Weights   12/21/23 0908 12/21/23 1258 12/21/23 1759  Weight: 106.6 kg 106.6 kg 116.6 kg   Exam:  Constitutional:  The patient is awake, alert, and oriented x 3. No acute distress. Respiratory:  No increased work of breathing. No wheezes, rales, or rhonchi No tactile fremitus Cardiovascular:  Regular rate and rhythm No murmurs, ectopy, or gallups. No lateral PMI. No thrills. Abdomen:  Abdomen is soft, non-tender, non-distended No hernias, masses, or organomegaly Normoactive bowel sounds.  Musculoskeletal:  No cyanosis, clubbing, or edema Right hand is  bandaged. Skin:  No rashes, lesions, ulcers palpation of skin: no induration or nodules Neurologic:  CN 2-12 intact Sensation all 4 extremities intact Psychiatric:  Mental status Mood, affect appropriate Orientation to person, place, time  judgment and insight appear intact   Condition at discharge: fair  The results of significant diagnostics from this hospitalization (including imaging, microbiology, ancillary and laboratory) are listed below for reference.   Imaging Studies: DG Hand Complete Right Result Date: 12/21/2023 EXAM: 3 or more VIEW(S) XRAY OF THE RIGHT HAND 12/21/2023 10:18:00 AM COMPARISON: None available. CLINICAL HISTORY: Trauma. Right arm swelling onset 2 days ago. Reports previous injury to right elbow that causes intermittent swelling, but never this severe. Pt denies injury to hand, only elbow. FINDINGS: BONES AND JOINTS: No acute fracture. No focal osseous lesion. No joint dislocation. SOFT TISSUES: Dorsal soft tissue swelling. Vascular calcifications. IMPRESSION: 1. No acute osseous abnormality. 2. Dorsal soft tissue swelling, likely related to the reported trauma. Electronically signed by: Lonni Necessary MD 12/21/2023 11:07 AM EDT RP Workstation: [id number]    Microbiology: Results for orders placed or performed during the hospital encounter of 12/21/23  Blood culture (routine x 2)     Status: None (Preliminary result)   Collection Time: 12/21/23  9:45 AM   Specimen: BLOOD RIGHT FOREARM  Result Value Ref Range Status   Specimen Description   Final    BLOOD RIGHT FOREARM Performed at [facility], [address] [place], [place] [address]    Special Requests   Final    BOTTLES DRAWN AEROBIC AND ANAEROBIC Blood Culture adequate volume Performed at [facility], [address] [place], [place] [address]    Culture   Final    NO GROWTH < 24 HOURS Performed at [facility], [address], [place], [place] [address]     Report Status PENDING  Incomplete  Blood culture (routine x 2)     Status: None (Preliminary result)   Collection Time: 12/21/23  9:58 AM   Specimen: BLOOD  Result Value Ref Range Status   Specimen Description   Final    BLOOD LEFT ANTECUBITAL Performed at [facility], [address] [place], [place] [address]    Special Requests   Final    BOTTLES DRAWN AEROBIC AND ANAEROBIC Blood Culture adequate volume Performed at [facility], [address] [place], [place] [address]    Culture   Final    NO GROWTH < 24 HOURS Performed at [facility], [address], [place], [place] [address]    Report Status PENDING  Incomplete  Aerobic/Anaerobic Culture w Gram Stain (surgical/deep wound)     Status: None (Preliminary result)   Collection Time: 12/21/23  2:51 PM   Specimen: Wound  Result Value Ref Range Status   Specimen Description WOUND  Final   Special Requests RT LONG FINGER  Final   Gram Stain NO WBC SEEN NO ORGANISMS SEEN   Final   Culture   Final    TOO YOUNG TO READ Performed at [facility], [address], [place], [place] [address]    Report Status PENDING  Incomplete    Labs: CBC: Recent Labs  Lab 12/21/23 0923 12/22/23 0600  WBC 9.6 10.0  NEUTROABS 8.2*  --   HGB 9.8* 9.1*  HCT 27.5* 26.2*  MCV 81.1 82.9  PLT 175 167   Basic Metabolic Panel: Recent Labs  Lab 12/21/23 0923 12/22/23 0600  NA 134* 137  K 4.4 4.5  CL 101 105  CO2 21* 22  GLUCOSE 412* 152*  BUN 25* 23*  CREATININE 2.16* 2.19*  CALCIUM  8.0* 7.4*   Liver Function Tests: Recent Labs  Lab 12/21/23 0923 12/22/23 0600  AST 26 22  ALT 29 18  ALKPHOS 189* 146*  BILITOT 0.3 0.5  PROT 5.9* 5.2*  ALBUMIN 3.1* 2.0*   CBG: Recent Labs  Lab 12/21/23 1818 12/21/23 2137 12/22/23 0752 12/22/23 1058 12/22/23 1210  GLUCAP 89 121* 130* 107* 107*    Discharge time spent: greater than 30 minutes.  Signed: Sejal Cofield, DO [facility] 12/22/2023

## 2023-12-22 NOTE — Discharge Instructions (Signed)
    Hand Surgery Postop Instructions  Take antibiotics as prescribed  Dressings: Maintain postoperative dressing until orthopedic follow-up.  Keep operative site clean and dry until orthopedic follow-up.  Wound Care: Keep your hand elevated above the level of your heart.  Do not allow it to dangle by your side. Moving your fingers is advised to stimulate circulation but will depend on the site of your surgery.  If you have a splint applied, your doctor will advise you regarding movement.  Activity: Do not drive or operate machinery until clearance given from physician. No heavy lifting with operative extremity.  Diet:  Drink liquids today or eat a light diet.  You may resume a regular diet tomorrow.    General expectations: Take prescribed medication if given, transition to over-the-counter medication as quickly as possible. Fingers may become slightly swollen.  Call your doctor if any of the following occur: Severe pain not relieved by pain medication. Elevated temperature. Dressing soaked with blood. Inability to move fingers. White or bluish color to fingers.   Per [facility] clinic policy, our goal is ensure optimal postoperative pain control with a multimodal pain management strategy. For all [facility] patients, our goal is to wean post-operative narcotic medications by 6 weeks post-operatively. If this is not possible due to utilization of pain medication prior to surgery, your [facility] doctor will support your acute post-operative pain control for the first 6 weeks postoperatively, with a plan to transition you back to your primary pain team following that. Cyndia Skeeters will work to ensure a Therapist, occupational.  Josede Cicero Trevor Mace, M.D. Hand Surgery [facility]

## 2023-12-22 NOTE — Progress Notes (Signed)
 Administered 15 units scheduled Lantus . CBG 107

## 2023-12-23 ENCOUNTER — Encounter (HOSPITAL_COMMUNITY): Payer: Self-pay | Admitting: Orthopedic Surgery

## 2023-12-23 NOTE — Anesthesia Postprocedure Evaluation (Signed)
 Anesthesia Post Note  Patient: Rodney Powell  Procedure(s) Performed: IRRIGATION AND DEBRIDEMENT WOUND (Right: Middle Finger)     Patient location during evaluation: PACU Anesthesia Type: General Level of consciousness: awake and alert Pain management: pain level controlled Vital Signs Assessment: post-procedure vital signs reviewed and stable Respiratory status: spontaneous breathing, nonlabored ventilation, respiratory function stable and patient connected to nasal cannula oxygen Cardiovascular status: blood pressure returned to baseline and stable Postop Assessment: no apparent nausea or vomiting Anesthetic complications: no   No notable events documented.  Last Vitals:  Vitals:   12/22/23 0747 12/22/23 1203  BP: (!) 155/91 (!) 154/88  Pulse: (!) 109 100  Resp: 16 17  Temp: 37.4 C 37.7 C  SpO2: 97% 98%    Last Pain:  Vitals:   12/22/23 1203  TempSrc: Oral  PainSc:                  [name]

## 2023-12-24 ENCOUNTER — Other Ambulatory Visit (HOSPITAL_COMMUNITY): Payer: Self-pay

## 2023-12-24 ENCOUNTER — Telehealth: Payer: Self-pay

## 2023-12-24 NOTE — Transitions of Care (Post Inpatient/ED Visit) (Signed)
 12/24/2023  Name: Rodney Powell MRN: [id number] DOB: [birth year]  Today's TOC FU Call Status: Today's TOC FU Call Status:: Successful TOC FU Call Completed TOC FU Call Complete Date: 12/24/23 Patient's Name and Date of Birth confirmed.  Transition Care Management Follow-up Telephone Call Date of Discharge: 12/22/23 Discharge Facility: [facility]) Type of Discharge: Inpatient Admission Primary Inpatient Discharge Diagnosis:: tenosynovitis of right hand How have you been since you were released from the hospital?: Same (He said he isn't feeling good, still having pain. I told him to call the surgeon to report the persistent pain but he said he didn't like the surgeon's attitude and felt he was rushed out of the hospital. I still encourged him to call surgery.) Any questions or concerns?: Yes Patient Questions/Concerns:: He is concerned about his medical costs, including the cost of his appointments and medications.  He was hesitant to schedule a follow up appointment with PCP.  He only has family planning Medicaid.  I told him that i can refer him to the [facility] DSS Medicaid Eligiblity Caseworkers to see if he would qualify for full Medicaid and he was in agreement.  I explained that if he does not qualify we can refer him for [facility] as well as prescription assistance so he can make sure he gets all of his prescribed medications. Patient Questions/Concerns Addressed: Other: (referral placed to Medicaid case workers.)  Items Reviewed: Did you receive and understand the discharge instructions provided?: Yes Medications obtained,verified, and reconciled?: Yes (Medications Reviewed) (He said he has all of his medications as well as a glucometer, not a Freestyle CGM.  He stated he needs to order more lancets/test strips.  He didn't have any questions about the med regime.) Any new allergies since your discharge?: No Dietary orders reviewed?: No (He said he needed to  take another call so we had to end this call.) Do you have support at home?: No (He stated he lives alone)  Medications Reviewed Today: Medications Reviewed Today     Reviewed by Marvis Bradley, RN (Case Manager) on 12/24/23 at 1828  Med List Status: <None>   Medication Order Taking? Sig Documenting Provider Last Dose Status Informant  amoxicillin -clavulanate (AUGMENTIN ) 875-125 MG tablet [id number]  Take 1 tablet by mouth 2 (two) times daily for 7 days. Swayze, Ava, DO  Active   atorvastatin  (LIPITOR) 40 MG tablet [id number]  Take 1 tablet (40 mg total) by mouth daily. For cholesterol Swayze, Ava, DO  Active   Continuous Glucose Sensor (FREESTYLE LIBRE SENSOR SYSTEM) MISC [id number]  Change sensor Q 2 wks  Patient not taking: Reported on 12/24/2023   Swayze, Ava, DO  Active   empagliflozin  (JARDIANCE ) 25 MG TABS tablet [id number]  Take 1 tablet (25 mg total) by mouth daily before breakfast. Swayze, Ava, DO  Active   HYDROcodone -acetaminophen  (NORCO/VICODIN) 5-325 MG tablet [id number]  Take 1 tablet by mouth every 6 (six) hours as needed for moderate pain (pain score 4-6) or severe pain (pain score 7-10). Swayze, Ava, DO  Active   insulin  glargine (LANTUS ) 100 UNIT/ML Solostar Pen [id number]  Inject 25 Units into the skin daily. Swayze, Ava, DO  Active   Insulin  Pen Needle (PEN NEEDLES) 32G X 4 MM MISC [id number]  Use to inject insulin  once a day. Swayze, Ava, DO  Active   irbesartan  (AVAPRO ) 150 MG tablet [id number]  Take 1 tablet (150 mg total) by mouth daily. Swayze, Ava, DO  Active     Discontinued 03/08/20 0128  Misc. Devices MISC [id number]  Please provide patient with insurance approved meter, strips and lancets. E11.65. Strips QTY 200 Lancets QTY 200 with 6 refills. Use as instructed. Check blood glucose level by fingerstick twice per day. Theotis Haze ORN, NP  Active Self  polyethylene glycol powder (GLYCOLAX /MIRALAX ) 17 GM/SCOOP powder [id number]  Dissolve 1 capful (17g) in 4-8 ounces of liquid  and take by mouth daily as needed for mild constipation Swayze, Ava, DO  Active             Home Care and Equipment/Supplies: Were Home Health Services Ordered?: No Any new equipment or medical supplies ordered?: No  Functional Questionnaire: Do you need assistance with bathing/showering or dressing?: No (He said he just needs to do everything slowly.  He stated htat the dressing is still intact on his hand) Do you need assistance with meal preparation?: No Do you need assistance with eating?: No Do you have difficulty maintaining continence: No Do you need assistance with getting out of bed/getting out of a chair/moving?: No Do you have difficulty managing or taking your medications?: No (He stated he checks his own blood sugars and administers his own insulin )  Follow up appointments reviewed: PCP Follow-up appointment confirmed?: No MD Provider Line [phone] Given: No (He said he will call to schedule an appointment, he had to take another call.) Specialist Hospital Follow-up appointment confirmed?: Yes Date of Specialist follow-up appointment?: 12/26/23 Follow-Up Specialty Provider:: orthopedic surgeon Do you need transportation to your follow-up appointment?: No Do you understand care options if your condition(s) worsen?: Yes-patient verbalized understanding    SIGNATURE Slater Diesel, RN

## 2023-12-25 ENCOUNTER — Other Ambulatory Visit (HOSPITAL_COMMUNITY): Payer: Self-pay

## 2023-12-25 NOTE — Telephone Encounter (Unsigned)
 Copied from CRM #[id number]. Topic: General - Other >> Dec 24, 2023  5:07 PM Tobias L wrote: Reason for CRM: Patient states he was speaking to someone earlier about scheduling an appointment. Patient states his insurance is not working right now and whoever you he was speaking, offered financial assistance through a Child psychotherapist or other options.   Patient requesting callback: [phone]

## 2023-12-25 NOTE — Telephone Encounter (Signed)
 I returned the call to the patient and asked him if the [facility] caseworker called him yet and he said no.  I told him that she said she would be calling him today. I also told him that I will ask the clinic financial counselor to reach out to him and he was agreeable.    He stated he did not know that he has [employer] and thought he has BCBS; however he could not tell me who the BCBS is through.   Josselin, can you please call him?  Thanks

## 2023-12-26 ENCOUNTER — Telehealth: Payer: Self-pay

## 2023-12-26 ENCOUNTER — Ambulatory Visit: Payer: Self-pay | Admitting: Orthopedic Surgery

## 2023-12-26 DIAGNOSIS — M65949 Unspecified synovitis and tenosynovitis, unspecified hand: Secondary | ICD-10-CM

## 2023-12-26 DIAGNOSIS — R600 Localized edema: Secondary | ICD-10-CM

## 2023-12-26 LAB — AEROBIC/ANAEROBIC CULTURE W GRAM STAIN (SURGICAL/DEEP WOUND): Gram Stain: NONE SEEN

## 2023-12-26 LAB — CULTURE, BLOOD (ROUTINE X 2)
Culture: NO GROWTH
Culture: NO GROWTH
Special Requests: ADEQUATE
Special Requests: ADEQUATE

## 2023-12-26 NOTE — Progress Notes (Signed)
   Rodney Powell - 48 y.o. male MRN [id number]  Date of birth: [birth year]  Office Visit Note: Visit Date: 12/26/2023 PCP: Theotis Haze ORN, NP Referred by: Theotis Haze ORN, NP  Subjective:  HPI: Rodney Powell is a 48 y.o. male who presents today for follow up 6 days status post Right long finger drainage of tendon sheath for flexor tenosynovitis, Right long finger PIP joint arthrotomy for infection with exploration and drainage, interphalangeal joint, and Incision and drainage of abscess dorsal hand.  He is having ongoing swelling throughout the hand with associated warmth.  No fevers or chills.  States that he did receive the insulin  from the medical pharmacy upon discharge, was unable to obtain the glucose meter so has not been able to check his sugars recently.  Pertinent ROS were reviewed with the patient and found to be negative unless otherwise specified above in HPI.   Assessment & Plan: Visit Diagnoses:  1. Flexor tenosynovitis of finger   2. Hand edema     Plan: Emphasis was placed with the patient today on the dressing changes and the importance of glucose control in the near future.  I did once again reiterate that he should try and pick up the glucose meter that was arranged for him upon discharge from the hospital.  I have also greatly encouraged him to continue with his insulin  regimen as given to him by the medical staff upon discharge.  I did explain that this will be paramount for his ongoing healing of this ongoing infection.  There is evidence of persistent drainage particularly at the dorsal aspect of the long finger.  Given the significance of his underlying infection preoperatively, did explain that it will be paramount for us  to keep a close watch on this wound as it heals.  Strict return precautions were discussed with the patient today.  I explained that should the erythema or drainage increase, he develop any systemic symptoms, he should present to the emergency  department setting for potential medical admission and IV antibiotics.  For the time being, he should continue with the oral antibiotics as prescribed, continue with daily dressing changes as needed for ongoing drainage.  I will plan on seeing him back on Monday next week for repeat wound check.  Follow-up: No follow-ups on file.   Meds & Orders: No orders of the defined types were placed in this encounter.  No orders of the defined types were placed in this encounter.    Procedures: No procedures performed       Objective:   Vital Signs: There were no vitals taken for this visit.  Ortho Exam Right hand with multiple volar and dorsal wounds, skin edges well-approximated over the volar and dorsal aspect of the hand, long finger with dorsal wound with notable drainage, expressible, mild purulence, associated warmth throughout the hand with associated swelling over the dorsal aspect of the hand as well  Imaging: No results found.   Jadea Shiffer [name], M.D. [facility], Hand Surgery

## 2023-12-26 NOTE — Telephone Encounter (Signed)
 Called pt to express importance of continuing to take his antibiotics. Pt expressed understanding and will message when he is finished with his prescription

## 2023-12-30 ENCOUNTER — Other Ambulatory Visit: Payer: Self-pay | Admitting: Orthopedic Surgery

## 2023-12-30 ENCOUNTER — Ambulatory Visit (INDEPENDENT_AMBULATORY_CARE_PROVIDER_SITE_OTHER): Payer: Self-pay | Admitting: Orthopedic Surgery

## 2023-12-30 DIAGNOSIS — M65949 Unspecified synovitis and tenosynovitis, unspecified hand: Secondary | ICD-10-CM

## 2023-12-30 MED ORDER — AMOXICILLIN-POT CLAVULANATE 500-125 MG PO TABS: 1.0000 | ORAL_TABLET | Freq: Three times a day (TID) | ORAL | 0 refills | Status: AC

## 2023-12-30 NOTE — Progress Notes (Signed)
   Joran Kallal - 48 y.o. male MRN [id number]  Date of birth: Jan 25, 1976  Office Visit Note: Visit Date: 12/30/2023 PCP: Theotis Haze ORN, NP Referred by: Theotis Haze ORN, NP  Subjective:  HPI: Matson Welch is a 48 y.o. male who presents today for follow up 9 days status post Right long finger drainage of tendon sheath for flexor tenosynovitis, Right long finger PIP joint arthrotomy for infection with exploration and drainage, interphalangeal joint, and Incision and drainage of abscess dorsal hand.  Pain is improving, swelling is improving.  He still does have some ongoing soreness throughout the hand and forearm region.  Denies any systemic symptoms.  States he has been taking his antibiotics as prescribed.  Pertinent ROS were reviewed with the patient and found to be negative unless otherwise specified above in HPI.   Assessment & Plan: Visit Diagnoses:  1. Flexor tenosynovitis of finger     Plan: He is doing much better postoperatively on examination today.  The erythema and warmth throughout the right hand and forearm continues to improve as does the ongoing swelling.  Sutures will remain in.  Continue antibiotics as prescribed.  Follow-up in 1 week for repeat wound check and possible suture removal.  Follow-up: No follow-ups on file.   Meds & Orders: No orders of the defined types were placed in this encounter.  No orders of the defined types were placed in this encounter.    Procedures: No procedures performed       Objective:   Vital Signs: There were no vitals taken for this visit.  Ortho Exam Right hand with multiple volar and dorsal wounds, skin edges well-approximated over the volar and dorsal aspect of the hand, long finger with dorsal wound with minimal drainage  Imaging: No results found.   Shant Hence [name], M.D. [facility], Hand Surgery

## 2024-01-06 ENCOUNTER — Ambulatory Visit: Payer: Self-pay | Admitting: Orthopedic Surgery

## 2024-01-06 DIAGNOSIS — M65949 Unspecified synovitis and tenosynovitis, unspecified hand: Secondary | ICD-10-CM

## 2024-01-06 DIAGNOSIS — R600 Localized edema: Secondary | ICD-10-CM

## 2024-01-06 NOTE — Progress Notes (Signed)
   Rodney Powell - 48 y.o. male MRN [id number]  Date of birth: 28-Powell-1977  Office Visit Note: Visit Date: 01/06/2024 PCP: Theotis Haze ORN, NP Referred by: Theotis Haze ORN, NP  Subjective:  HPI: Rodney Powell is a 48 y.o. male who presents today for follow up 2 weeks status post Right long finger drainage of tendon sheath for flexor tenosynovitis, Right long finger PIP joint arthrotomy for infection with exploration and drainage, interphalangeal joint, and Incision and drainage of abscess dorsal hand.  Pain is improving, swelling is improving.  Has obtained his glucometer and is keeping a much closer eye on his blood sugars which is excellent.  Pertinent ROS were reviewed with the patient and found to be negative unless otherwise specified above in HPI.   Assessment & Plan: Visit Diagnoses:  1. Flexor tenosynovitis of finger   2. Hand edema      Plan: He continues to do very well postoperatively.  Sutures removed today.  He is demonstrating significant improvement from his swelling and range of motion standpoint.  Continue with range of motion exercises.  He states he is also doing much better with his diabetic control which is excellent.  Follow-up in 2 to 3 weeks for wound check.  Follow-up: No follow-ups on file.   Meds & Orders: No orders of the defined types were placed in this encounter.  No orders of the defined types were placed in this encounter.    Procedures: No procedures performed       Objective:   Vital Signs: There were no vitals taken for this visit.  Ortho Exam Right hand with multiple volar and dorsal wounds, skin edges well-approximated over the volar and dorsal aspect of the hand, sutures removed today, no erythema or drainage, swelling drastically improved, able to perform digital range of motion without significant pain to all digits  Imaging: No results found.   Thorne Wirz [name], M.D. [facility], Hand Surgery

## 2024-01-16 ENCOUNTER — Telehealth: Payer: Self-pay | Admitting: Orthopedic Surgery

## 2024-01-16 NOTE — Telephone Encounter (Signed)
 Patient came in about the forms and his operative hand is swelling and increase of pain. CB#[phone]

## 2024-01-20 ENCOUNTER — Encounter: Admitting: Orthopedic Surgery

## 2024-01-21 ENCOUNTER — Ambulatory Visit: Payer: Self-pay | Admitting: Orthopedic Surgery

## 2024-01-21 DIAGNOSIS — R600 Localized edema: Secondary | ICD-10-CM

## 2024-01-21 DIAGNOSIS — M65949 Unspecified synovitis and tenosynovitis, unspecified hand: Secondary | ICD-10-CM

## 2024-01-21 NOTE — Progress Notes (Signed)
   Rodney Powell - 48 y.o. male MRN [id number]  Date of birth: 06/15/1975  Office Visit Note: Visit Date: 01/21/2024 PCP: Theotis Haze ORN, NP Referred by: Theotis Haze ORN, NP  Subjective:  HPI: Rodney Powell is a 48 y.o. male who presents today for follow up 4 weeks status post Right long finger drainage of tendon sheath for flexor tenosynovitis, Right long finger PIP joint arthrotomy for infection with exploration and drainage, interphalangeal joint, and Incision and drainage of abscess dorsal hand.  Pain is improving, swelling is improving.  Has continued to utilize his glucometer and is keeping a much closer eye on his blood sugars which is excellent.  Pertinent ROS were reviewed with the patient and found to be negative unless otherwise specified above in HPI.   Assessment & Plan: Visit Diagnoses:  1. Flexor tenosynovitis of finger   2. Hand edema       Plan: He continues to make good progress postoperatively.  Work note was given today to refrain from work activities for additional 1 week.  He can begin activities as tolerated at that juncture.  Will also place a referral to occupational therapy to continue to improve from a range of motion and strengthening standpoint.  He can follow-up with myself in approximate 6 weeks.  Follow-up: No follow-ups on file.   Meds & Orders: No orders of the defined types were placed in this encounter.  No orders of the defined types were placed in this encounter.    Procedures: No procedures performed       Objective:   Vital Signs: There were no vitals taken for this visit.  Ortho Exam Right hand with multiple volar and dorsal wounds, skin edges well-approximated over the volar and dorsal aspect of the hand, no erythema or drainage, swelling minimal, able to perform digital range of motion without significant pain to all digits  Grip strength Jamar 2 right 25, left 40  Imaging: No results found.   Eliani Leclere [name],  M.D. [facility], Hand Surgery

## 2024-01-24 ENCOUNTER — Telehealth: Payer: Self-pay | Admitting: Radiology

## 2024-01-24 ENCOUNTER — Other Ambulatory Visit: Payer: Self-pay | Admitting: Orthopedic Surgery

## 2024-01-24 DIAGNOSIS — R600 Localized edema: Secondary | ICD-10-CM

## 2024-01-24 DIAGNOSIS — M65949 Unspecified synovitis and tenosynovitis, unspecified hand: Secondary | ICD-10-CM

## 2024-01-24 NOTE — Telephone Encounter (Signed)
 Patient called today, spoke with Tammy.  He said he has not heard from OT.  I entered OT order, and routed to [facility] to address.    Patient also says his hand is still very swollen and he can hardly open a door/grasp door knob, cannot open a water bottle.  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  Please get with me on any adjustments we can make w/ his OOW status?  I have a few other questions on timeline with messages, etc.  Thanks-

## 2024-01-27 ENCOUNTER — Encounter: Payer: Self-pay | Admitting: Orthopedic Surgery

## 2024-01-28 ENCOUNTER — Other Ambulatory Visit: Payer: Self-pay

## 2024-01-28 ENCOUNTER — Ambulatory Visit: Payer: Self-pay | Attending: Orthopedic Surgery | Admitting: Occupational Therapy

## 2024-01-28 DIAGNOSIS — R29898 Other symptoms and signs involving the musculoskeletal system: Secondary | ICD-10-CM | POA: Insufficient documentation

## 2024-01-28 DIAGNOSIS — M6281 Muscle weakness (generalized): Secondary | ICD-10-CM | POA: Insufficient documentation

## 2024-01-28 DIAGNOSIS — R278 Other lack of coordination: Secondary | ICD-10-CM | POA: Insufficient documentation

## 2024-01-28 DIAGNOSIS — R600 Localized edema: Secondary | ICD-10-CM | POA: Insufficient documentation

## 2024-01-28 DIAGNOSIS — M65949 Unspecified synovitis and tenosynovitis, unspecified hand: Secondary | ICD-10-CM | POA: Insufficient documentation

## 2024-01-28 DIAGNOSIS — R208 Other disturbances of skin sensation: Secondary | ICD-10-CM | POA: Insufficient documentation

## 2024-01-28 NOTE — Therapy (Addendum)
 OUTPATIENT OCCUPATIONAL THERAPY ORTHO EVALUATION & TREATMENT  Patient Name: Rodney Powell MRN: [id number] DOB:1976-01-05, 48 y.o., male Today's Date: 01/28/2024  PCP: Theotis Haze ORN, NP REFERRING PROVIDER: Arlinda Buster, MD  END OF SESSION:  OT End of Session - 01/28/24 0904     Visit Number 1    Number of Visits 16    Date for Recertification  03/27/24    OT Start Time 0812    OT Stop Time 0856    OT Time Calculation (min) 44 min    Equipment Utilized During Treatment Testing materials    Activity Tolerance Patient limited by pain;Patient tolerated treatment well    Behavior During Therapy [facility] for tasks assessed/performed          Past Medical History:  Diagnosis Date   Back pain    Diabetes (HCC)    Hypertension    Past Surgical History:  Procedure Laterality Date   INCISION AND DRAINAGE OF WOUND Right 12/21/2023   Procedure: IRRIGATION AND DEBRIDEMENT WOUND;  Surgeon: Arlinda Buster, MD;  Location: [facility] OR;  Service: Orthopedics;  Laterality: Right;  RIGHT LONG FINGER WOUND   INCISION AND DRAINAGE PERIRECTAL ABSCESS Right 01/04/2016   Procedure: IRRIGATION AND DEBRIDEMENT RIGHT BUTTOCKS  ABSCESS;  Surgeon: Krystal Spinner, MD;  Location: [facility] ORS;  Service: General;  Laterality: Right;   Patient Active Problem List   Diagnosis Date Noted   Flexor tenosynovitis of finger 12/21/2023   Abscess of finger of right hand 12/21/2023   Abscess of bursa of right hand 12/21/2023   Cellulitis 12/21/2023   Tenosynovitis of right hand 12/21/2023   Dyslipidemia 05/28/2023   Type 2 diabetes mellitus with hyperglycemia (HCC) 05/28/2023   Lumbar radiculopathy, chronic 09/25/2021   Abscess of right buttock 01/03/2016   Elevated blood sugar 01/03/2016   History of chest pain 01/03/2016    ONSET DATE:01/24/2024  REFERRING DIAG:  Diagnosis  M65.949 (ICD-10-CM) - Flexor tenosynovitis of finger  R60.0 (ICD-10-CM) - Hand edema    THERAPY DIAG:  No diagnosis found.  Rationale  for Evaluation and Treatment: Rehabilitation  SUBJECTIVE:   SUBJECTIVE STATEMENT: In August, the middle finger on the right hand began to sting, swell, and itch, but the symptoms initially started to improve. However, while at work in September, the finger was accidentally bumped against a cart, causing the entire hand to swell significantly. The pain became extreme, making it impossible to return to work the next day. The pt was admitted to the hospital, where surgery was performed.  Currently, the R middle finger remains very painful and partially numb, though there is some improvement in mobility.  Pt works at [employer] and has been on leave since the injury. Although he was originally expected to return to work today, he will remain out until after his next visit with their PCP.  Pt accompanied by: self  PERTINENT HISTORY: Elevated BP, type 2 diabetes mellitus with hyperglycemia, lumbar radiculopathy, flexor tenosynovitis of finger, abscess bursa of R hand, hx of chest pain, cellulitis, and dyslipidemia  PRECAUTIONS: Back ( lumbar radiculopathy chronic)  RED FLAGS: None   WEIGHT BEARING RESTRICTIONS: No  PAIN:  Are you having pain? Yes: NPRS scale: 7/10 Pain location: R middle finger  Pain description: Needles and pins Aggravating factors: Moving it   FALLS: Has patient fallen in last 6 months? No  LIVING ENVIRONMENT: Lives with: lives alone Lives in: House/apartment Stairs: External (porch stairs)  Has following equipment at home: None  PLOF: Independent; The patient works at  [employer] but is currently on leave due to the injury. He is still able to drive and complete his daily activities, although it takes him more time and he often compensates by using his left hand. He is currently also experiencing difficulty with writing.  PATIENT GOALS: The patient's goal is to regain mobility and strength in his hand so he can return to work.  NEXT MD VISIT: 03/02/2024  OBJECTIVE:   Note: Objective measures were completed at Evaluation unless otherwise noted.  HAND DOMINANCE: Right  ADLs: Overall ADLs: Overall, the patient is independent with all ADLs. However, because the affected hand is the patient's dominant hand and he is experiencing pain and swelling, it takes him longer to complete tasks than usual. He often compensates by using his LUE.   Additionally, the patient has recently resumed light household tasks such as sweeping and mopping. However, he continues to have difficulty with activities that require grip strength, such as opening jars and water bottles, due to pain when attempting to make a fist.   FUNCTIONAL OUTCOME MEASURES: Quick Dash: 77.3% disability at eval   UPPER EXTREMITY ROM:     Active ROM Right eval Left eval  Shoulder flexion    Shoulder abduction    Shoulder adduction    Shoulder extension    Shoulder internal rotation    Shoulder external rotation    Elbow flexion    Elbow extension    Wrist flexion Lacks full digital flexion when wrist is flexed WNL  Wrist extension Lacks full digital extension when wrist is extended WNL  Wrist ulnar deviation    Wrist radial deviation    Wrist pronation    Wrist supination    (Blank rows = not tested)  Active ROM Right eval Left eval  Thumb MCP (0-60)    Thumb IP (0-80)    Thumb Radial abd/add (0-55)     Thumb Palmar abd/add (0-45)     Thumb Opposition to Small Finger     Index MCP (0-90)     Index PIP (0-100)     Index DIP (0-70)      Long MCP (0-90) 50*  76*   Long PIP (0-100) 68 * 90*   Long DIP (0-70)  28* 70*   Ring MCP (0-90)      Ring PIP (0-100)      Ring DIP (0-70)      Little MCP (0-90)      Little PIP (0-100)      Little DIP (0-70)       (Blank rows = not tested)  HAND FUNCTION:  One trial only at eval 01/28/24  Grip strength: Right: 14.5 lbs; Left: 99.4 lbs (LUE started at 77 with slow climb to 99 lbs) Pt experienced R hand pain when trying to grip the  dynamometer.   COORDINATION: 9 Hole Peg test: Right: 1 minute and 36 sec; Left: 36 sec Pt had difficulty with picking up pegs with R hand due to pain when having to flex middle finger; dropped 1 peg.   SENSATION: Pt reports feeling numbness on R middle finger.  EDEMA: Yes, right middle finger down to the palm area.   COGNITION: Overall cognitive status: Within functional limits for tasks assessed  OBSERVATIONS:  The patient presents with significant swelling and some stiffness in the right middle finger, which may be contributing to his pain and limiting the functional use of his RUE.    TREATMENT DATE: 01/28/2024     - Self-care/home management  completed for duration as noted below including:  OT educated pt on rehabilitation process and results of objective measures in relation to pt specific goals along with OT role and POC Considerations.                                                                                                     Therapist educated pt on manual techniques for scar massage at healed site of incision for reduction of scar tissue, to promote improved AROM and pain reduction of affected surgical site.  Pt provided instruction re: massaging scar in three directions: circles, side to side, and up and down with return demonstration sought and handout provided per pt instruction.   Pt also received compressive finger sleeve to help decrease localized edema in the right middle finger,  as reduced swelling will improve joint mobility.  Education provided on removing finger sleeves to monitor skin and ensure good circulation and not redistribution of swelling.  - Therapeutic exercises completed for duration as noted below including:  Pt issued tendon gliding exercises/handout with review of motions to isolate DIP, PIP and MCP joints for straight finger position, hook (DIP/PIP flexion), fist (DIP/PIP/MCP flexion), taco/duck (MCP flexion only) and flat fist (MCP and PIP  flexion). Education provided on purpose of tendon glide exercises ie) to increase the circulation to the hand and wrist, reduce swelling and promote healthier soft tissue for increased AROM.  The patient was also provided with additional exercises focused on stretching the wrist and fingers, as well as blocking specific finger joints to target improved mobility in the right middle finger. These exercises may also aid in reducing swelling in the affected area. Exercises include; - Seated Wrist Prayer Stretch  - Finger PIP Flexion Extension with Blocking   - Seated Finger DIP Flexion AROM with Blocking  - Finger MP Flexion Extension    PATIENT EDUCATION: Education details: OT role and POC considerations; Tendon glides, scar massage, wrist/ digit exercises Person educated: Patient Education method: Explanation, Demonstration, Tactile cues, Verbal cues, and Handouts Education comprehension: verbalized understanding, returned demonstration, verbal cues required, tactile cues required, and needs further education  HOME EXERCISE PROGRAM: 01/28/24:  Tendon glides, scar massage, wrist/digit exercises- Access code; [email or web address]  GOALS: Goals reviewed with patient? Yes  SHORT TERM GOALS: Target date: 02/28/2024  Patient will demonstrate initial R UE HEP with 25% verbal cues or less for proper execution.  Baseline: Received tendon glides, scar massage handout, and wrist/digit exercises at eval Goal status: IN Progress  2.  The patient will verbalize understanding of edema management strategies and demonstrate a noticeable decrease in edema in the RUE.  Baseline: Significant swelling noted in R hand (middle finger), issued compressive finger sleeve at eval Goal status: IN Progress  3.  Pt will independently recall at least 3 total joint protection, ergonomic, and body mechanic principles.   Baseline: Pt currently use R thumb to hold objects such as grocery bags.  Goal status: INITIAL  4. Pt will  independently recall the 5 main sensory precautions (cold, heat, sharp, chemical, and heavy) as needed to prevent  injury/harm secondary to impairments.   Baseline: Pt lacks sensation in R middle finger Goal status: INITIAL  5. Patient will verbalize two effective pain management techniques to use at home to improve functional use of RUE.  Baseline: Pt currently refrains from using his R hand due to the amount of pain when having to move the middle finger.  Goal status: INITIAL   LONG TERM GOALS: Target date: 03/27/2024   1. Patient will demonstrate updated R UE HEP with visual handouts only for proper execution.              Baseline: New to outpt OT             Goal status: INITIAL  2.  Patient will demonstrate at least 50 lbs RUE grip strength as needed to open jars and other containers.  Baseline: 14.5 lbs (R hand) ; 99.4 lbs (L hand)  Goal status: INITIAL  3.  Patient will demo improved FM coordination as evidenced by completing nine-hole peg with use of RUE in 30 seconds or less.  Baseline: Right: 1 minute and 36 sec; Left: 36 sec Goal status: INITIAL  4.The patient will increase AROM in digital flexion of middle finger (PIP,DIP, MCP) joints by 20 degrees in order to achieve full composite digital flexion, improving functional use of the hand during daily activities.             Baseline: RUE: MCP 50*, PIP 68*, DIP 28* versus LUE: MCP 76*, PIP 90*, PIP 70* Goal status: INITIAL  5. Patient will demonstrate at least 30% improvement with quick Dash score (reporting <50% disability or less) indicating improved functional use of affected extremity.  Baseline: 77.3% disability  Goal status: INITIAL   ASSESSMENT:  CLINICAL IMPRESSION: Patient is a 48 y.o. male who was seen today for occupational therapy evaluation for flexor tenosynovitis of RUE middle finger and surgical irrigation and debridement with ongoing edema. PMHx includes elevated BP, type 2 diabetes mellitus with  hyperglycemia, lumbar radiculopathy, flexor tenosynovitis of finger, abscess bursa of R hand, hx of chest pain, cellulitis, and dyslipidemia. Patient currently presents below baseline level of functioning demonstrating functional deficits and impairments as noted below. Pt would benefit from skilled OT services in the outpatient setting to work on impairments as noted below to help pt return to PLOF as able.     PERFORMANCE DEFICITS: in functional skills including ADLs, IADLs, coordination, dexterity, sensation, edema, ROM, strength, pain, Fine motor control, Gross motor control, endurance, decreased knowledge of precautions, decreased knowledge of use of DME, wound, skin integrity, and UE functional use.   IMPAIRMENTS: are limiting patient from ADLs, IADLs, rest and sleep, work, leisure, and social participation.   COMORBIDITIES: may have co-morbidities  that affects occupational performance. Patient will benefit from skilled OT to address above impairments and improve overall function.  MODIFICATION OR ASSISTANCE TO COMPLETE EVALUATION: Min-Moderate modification of tasks or assist with assess necessary to complete an evaluation.  OT OCCUPATIONAL PROFILE AND HISTORY: Detailed assessment: Review of records and additional review of physical, cognitive, psychosocial history related to current functional performance.  CLINICAL DECISION MAKING: Moderate - several treatment options, min-mod task modification necessary  REHAB POTENTIAL: Good  EVALUATION COMPLEXITY: Moderate      PLAN:  OT FREQUENCY: 1-2x/week  OT DURATION: 8 weeks  PLANNED INTERVENTIONS: 97168 OT Re-evaluation, 97535 self care/ADL training, 02889 therapeutic exercise, 97530 therapeutic activity, 97140 manual therapy, 97035 ultrasound, 97018 paraffin, 02960 fluidotherapy, 97010 moist heat, 97010 cryotherapy, 97034 contrast bath, [id number]  electrical stimulation (manual), 02249 Physical Performance Testing, [id number] Orthotic Initial,  02236 Orthotic/Prosthetic subsequent, manual lymph drainage, scar mobilization, passive range of motion, functional mobility training, compression bandaging, coping strategies training, patient/family education, and DME and/or AE instructions  RECOMMENDED OTHER SERVICES: None at the time of eval  CONSULTED AND AGREED WITH PLAN OF CARE: Patient  PLAN FOR NEXT SESSION:  Review HEP Fluido/US  & manual therapy- for edema management  Putty exercises as swelling decreases   Fransisco Messmer, Student-OT 01/28/2024, 9:06 AM

## 2024-01-28 NOTE — Patient Instructions (Addendum)
       Access Code: [email or web address] URL: [email or web address] Date: 01/28/2024 Prepared by: Clarita Pride   Exercises - Seated Wrist Prayer Stretch  - 5 x daily - 5-10 reps - Finger PIP Flexion Extension with Blocking  - 3-5 x daily - 5-10 reps - Seated Finger DIP Flexion AROM with Blocking  - 3-5 x daily - 5-10 reps - Finger MP Flexion Extension  - 3-5 x daily - 5-10 reps

## 2024-02-03 ENCOUNTER — Ambulatory Visit: Payer: Self-pay | Attending: Orthopedic Surgery | Admitting: Occupational Therapy

## 2024-02-03 DIAGNOSIS — R208 Other disturbances of skin sensation: Secondary | ICD-10-CM | POA: Insufficient documentation

## 2024-02-03 DIAGNOSIS — R600 Localized edema: Secondary | ICD-10-CM | POA: Insufficient documentation

## 2024-02-03 DIAGNOSIS — M6281 Muscle weakness (generalized): Secondary | ICD-10-CM | POA: Insufficient documentation

## 2024-02-03 DIAGNOSIS — R29898 Other symptoms and signs involving the musculoskeletal system: Secondary | ICD-10-CM | POA: Insufficient documentation

## 2024-02-03 DIAGNOSIS — M65949 Unspecified synovitis and tenosynovitis, unspecified hand: Secondary | ICD-10-CM | POA: Insufficient documentation

## 2024-02-03 NOTE — Patient Instructions (Signed)
  Manual techniques (retrograde massage) used to decrease edema of digits for increased tissue extensibility and therefore increased ROM.  Pt shown how to perform task prior ROM activities. Instructions included:   1) Elevate the hand or prop it on a pillow. 2) Start at the fingertips and move towards the base of the hand, then onto the wrist and forearm. 3) Use a light, gentle pressure, as excessive pressure can compress lymphatic vessels and hinder the effectiveness of the massage. 4) Always massage in a downward direction, towards the heart, to help drain the swelling. 5) Repeat the massage several times a day.

## 2024-02-03 NOTE — Therapy (Addendum)
 OUTPATIENT OCCUPATIONAL THERAPY ORTHO TREATMENT  Patient Name: Rodney Powell MRN: [id number] DOB:09/03/1975, 48 y.o., male Today's Date: 02/03/2024  PCP: Theotis Haze ORN, NP REFERRING PROVIDER: Arlinda Buster, MD  END OF SESSION:  OT End of Session - 02/03/24 1533     Visit Number 2    Number of Visits 16    Date for Recertification  03/27/24    Authorization Type Self- pay    OT Start Time 1533    OT Stop Time 1617    OT Time Calculation (min) 44 min    Equipment Utilized During Treatment Fluidotherapy    Activity Tolerance Patient tolerated treatment well    Behavior During Therapy WFL for tasks assessed/performed          Past Medical History:  Diagnosis Date   Back pain    Diabetes (HCC)    Hypertension    Past Surgical History:  Procedure Laterality Date   INCISION AND DRAINAGE OF WOUND Right 12/21/2023   Procedure: IRRIGATION AND DEBRIDEMENT WOUND;  Surgeon: Arlinda Buster, MD;  Location: [facility];  Service: Orthopedics;  Laterality: Right;  RIGHT LONG FINGER WOUND   INCISION AND DRAINAGE PERIRECTAL ABSCESS Right 01/04/2016   Procedure: IRRIGATION AND DEBRIDEMENT RIGHT BUTTOCKS  ABSCESS;  Surgeon: Krystal Spinner, MD;  Location: [facility];  Service: General;  Laterality: Right;   Patient Active Problem List   Diagnosis Date Noted   Flexor tenosynovitis of finger 12/21/2023   Abscess of finger of right hand 12/21/2023   Abscess of bursa of right hand 12/21/2023   Cellulitis 12/21/2023   Tenosynovitis of right hand 12/21/2023   Dyslipidemia 05/28/2023   Type 2 diabetes mellitus with hyperglycemia (HCC) 05/28/2023   Lumbar radiculopathy, chronic 09/25/2021   Abscess of right buttock 01/03/2016   Elevated blood sugar 01/03/2016   History of chest pain 01/03/2016    ONSET DATE:01/24/2024  REFERRING DIAG:  Diagnosis  M65.949 (ICD-10-CM) - Flexor tenosynovitis of finger  R60.0 (ICD-10-CM) - Hand edema    THERAPY DIAG:  Muscle weakness  (generalized)  Localized edema  Other disturbances of skin sensation  Other symptoms and signs involving the musculoskeletal system  Flexor tenosynovitis of finger  Rationale for Evaluation and Treatment: Rehabilitation  SUBJECTIVE:   SUBJECTIVE STATEMENT: Pain is not too bad while at rest 5/6 but when I'm using it for about 30 minutes pain is at an 8/10.  Pt reported he has been doing the exercises and can almost make a fist ie) fingers are closer to his palm although they still don't fully touch.    Pt accompanied by: self  PERTINENT HISTORY: Elevated BP, type 2 diabetes mellitus with hyperglycemia, lumbar radiculopathy, flexor tenosynovitis of finger, abscess bursa of R hand, hx of chest pain, cellulitis, and dyslipidemia  PRECAUTIONS: Back ( lumbar radiculopathy chronic)  RED FLAGS: None   WEIGHT BEARING RESTRICTIONS: No  PAIN:  Are you having pain? Yes: NPRS scale: 5-6/10 Pain location: R middle finger  Pain description: Needles and pins Aggravating factors: Moving it   FALLS: Has patient fallen in last 6 months? No  LIVING ENVIRONMENT: Lives with: lives alone Lives in: House/apartment Stairs: External (porch stairs)  Has following equipment at home: None  PLOF: Independent; The patient works at [employer] but is currently on leave due to the injury. He is still able to drive and complete his daily activities, although it takes him more time and he often compensates by using his left hand. He is currently also experiencing difficulty with writing.  PATIENT GOALS: The patient's goal is to regain mobility and strength in his hand so he can return to work.  NEXT MD VISIT: 03/02/2024  OBJECTIVE:  Note: Objective measures were completed at Evaluation unless otherwise noted.  HAND DOMINANCE: Right  ADLs: Overall ADLs: Overall, the patient is independent with all ADLs. However, because the affected hand is the patient's dominant hand and he is experiencing pain  and swelling, it takes him longer to complete tasks than usual. He often compensates by using his LUE.   Additionally, the patient has recently resumed light household tasks such as sweeping and mopping. However, he continues to have difficulty with activities that require grip strength, such as opening jars and water bottles, due to pain when attempting to make a fist.   FUNCTIONAL OUTCOME MEASURES: Quick Dash: 77.3% disability at eval   UPPER EXTREMITY ROM:     Active ROM Right eval Left eval  Shoulder flexion    Shoulder abduction    Shoulder adduction    Shoulder extension    Shoulder internal rotation    Shoulder external rotation    Elbow flexion    Elbow extension    Wrist flexion Lacks full digital flexion when wrist is flexed WNL  Wrist extension Lacks full digital extension when wrist is extended WNL  Wrist ulnar deviation    Wrist radial deviation    Wrist pronation    Wrist supination    (Blank rows = not tested)  Active ROM Right eval Left eval  Thumb MCP (0-60)    Thumb IP (0-80)    Thumb Radial abd/add (0-55)     Thumb Palmar abd/add (0-45)     Thumb Opposition to Small Finger     Index MCP (0-90)     Index PIP (0-100)     Index DIP (0-70)      Long MCP (0-90) 50*  76*   Long PIP (0-100) 68 * 90*   Long DIP (0-70)  28* 70*   Ring MCP (0-90)      Ring PIP (0-100)      Ring DIP (0-70)      Little MCP (0-90)      Little PIP (0-100)      Little DIP (0-70)       (Blank rows = not tested)  HAND FUNCTION:  One trial only at eval 01/28/24  Grip strength: Right: 14.5 lbs; Left: 99.4 lbs (LUE started at 77 with slow climb to 99 lbs) Pt experienced R hand pain when trying to grip the dynamometer.   COORDINATION: 9 Hole Peg test: Right: 1 minute and 36 sec; Left: 36 sec Pt had difficulty with picking up pegs with R hand due to pain when having to flex middle finger; dropped 1 peg.   SENSATION: Pt reports feeling numbness on R middle finger.  EDEMA:  Yes, right middle finger down to the palm area.   COGNITION: Overall cognitive status: Within functional limits for tasks assessed  OBSERVATIONS:  The patient presents with significant swelling and some stiffness in the right middle finger, which may be contributing to his pain and limiting the functional use of his RUE.   TREATMENT: - Self-care/home management completed for duration as noted below including:  OT educated patient in Safety considerations for loss of sensation as noted in patient instructions to reduce risk of injury to affected hand.  Pt encouraged to be careful of sharp, hot/cold (check temperatures of water), breakable, heavy objects and chemicals. Patient verbalized understanding.  Handout provided.   Pt was also educated on retrograde massaging techniques to help decrease localized swelling in the right hand, as reduction in swelling is expected to promote improved joint mobility in the right middle finger. Education included proper hand positioning and direction of massage (from fingertips toward the palm), use of gentle and consistent pressure to promote fluid movement, recommended frequency and duration of massage sessions (5 - 10 minutes), importance of elevating the hand during and after massage ( above the heart or elevated on a pillow), and monitoring for any signs of increased pain, redness, or discomfort. OT provided these techniques manually for 5 minutes, after which the pt was able to return demonstrate the proper techniques for 5 minutes.                                                                            In addition, pt verbalized that the compressive finger sleeve issued last week has been effective in reducing swelling in the right middle finger. Additional compressive finger sleeves were provided to continue assisting with the management of localized edema.   - Therapeutic exercises completed for duration as noted below including:  OT reviewed tendon  gliding exercises/ handout which included motions to isolate DIP, PIP and MCP joints for straight finger position, hook (DIP/PIP flexion), fist (DIP/PIP/MCP flexion), taco/duck (MCP flexion only) and flat fist (MCP and PIP flexion). Education provided on purpose of tendon glide exercises ie) to increase the circulation to the hand and wrist, reduce swelling and promote healthier soft tissue for increased AROM. In addition, OT also reviewed exercises focused on stretching the wrist and fingers, as well as blocking specific finger joints to target improved mobility in the right middle finger. These exercises may also aid in reducing swelling in the affected area. Exercises include; - Seated Wrist Prayer Stretch  - Finger PIP Flexion Extension with Blocking   - Seated Finger DIP Flexion AROM with Blocking  - Finger MP Flexion Extension    Pt then placed B UE in Fluidotherapy machine with supervised ROM x 10 min. Pt was educated to complete tendon glides and PROM exercises during modality time to improve ROM and decrease pain/stiffness of affected extremity by use of the machine's massaging action and thermal properties.  Pt reported and demonstrated more R digit mobility after modality demonstrating greater finger opposition.   Also introduced washcloth scrunching activity on table top to promote R digital mobility and isolated finger movements to pull washcloth inside the palm of his hand and then push it out with individual finger movements.  Activity designed to be a simple task he can complete at home in order to promote finger movement for improved ROM, decreased swelling and coordination of RUE.   PATIENT EDUCATION: Education details: Reviewed tendon glides and wrist/ digit exercises, fluido, retrograde, massage, sensory precautions Person educated: Patient Education method: Explanation, Demonstration, Tactile cues, Verbal cues, and Handouts Education comprehension: verbalized understanding,  returned demonstration, verbal cues required, tactile cues required, and needs further education  HOME EXERCISE PROGRAM: 01/28/24:  Tendon glides, scar massage, wrist/digit exercises- Access code; [email or web address] 02/03/24: Retrograde massage, sensory precautions   GOALS: Goals reviewed with patient? Yes  SHORT TERM GOALS: Target date: 02/28/2024  Patient will demonstrate initial R UE HEP with 25% verbal cues or less for proper execution.  Baseline: Received tendon glides, scar massage handout, and wrist/digit exercises at eval Goal status: IN Progress  2.  The patient will verbalize understanding of edema management strategies and demonstrate a noticeable decrease in edema in the RUE.  Baseline: Significant swelling noted in R hand (middle finger), issued compressive finger sleeve at eval Goal status: IN Progress  3.  Pt will independently recall at least 3 total joint protection, ergonomic, and body mechanic principles.   Baseline: Pt currently use R thumb to hold objects such as grocery bags.  Goal status: IN PROGRESS  4. Pt will independently recall the 5 main sensory precautions (cold, heat, sharp, chemical, and heavy) as needed to prevent injury/harm secondary to impairments.   Baseline: Pt lacks sensation in R middle finger- received handout 02/03/24 Goal status: IN PROGRESS  5. Patient will verbalize two effective pain management techniques to use at home to improve functional use of RUE.  Baseline: Pt currently refrains from using his R hand due to the amount of pain when having to move the middle finger.  Goal status: IN PROGRESS   LONG TERM GOALS: Target date: 03/27/2024   1. Patient will demonstrate updated R UE HEP with visual handouts only for proper execution.              Baseline: New to outpt OT             Goal status: IN PROGRESS  2.  Patient will demonstrate at least 50 lbs RUE grip strength as needed to open jars and other containers.  Baseline: 14.5 lbs (R hand)  ; 99.4 lbs (L hand)  Goal status: IN PROGRESS  3.  Patient will demo improved FM coordination as evidenced by completing nine-hole peg with use of RUE in 30 seconds or less.  Baseline: Right: 1 minute and 36 sec; Left: 36 sec Goal status: IN PROGRESS  4.The patient will increase AROM in digital flexion of middle finger (PIP,DIP, MCP) joints by 20 degrees in order to achieve full composite digital flexion, improving functional use of the hand during daily activities.             Baseline: RUE: MCP 50*, PIP 68*, DIP 28* versus LUE: MCP 76*, PIP 90*, PIP 70* Goal status: IN PROGRESS  5. Patient will demonstrate at least 30% improvement with quick Dash score (reporting <50% disability or less) indicating improved functional use of affected extremity.  Baseline: 77.3% disability  Goal status: IN PROGRESS   ASSESSMENT:  CLINICAL IMPRESSION: Patient presents with pain and edema in R middle finger secondary to flexor tenosynovitis with surgical irrigation. He demonstrates good rehab potential as he is observed to have decreased swelling and improved R digit mobility since the initial eval. He will continue to benefit from skilled outpatient OT to decrease pain and swelling of RUE for improved functional use for ADLs and IADLs.   PERFORMANCE DEFICITS: in functional skills including ADLs, IADLs, coordination, dexterity, sensation, edema, ROM, strength, pain, Fine motor control, Gross motor control, endurance, decreased knowledge of precautions, decreased knowledge of use of DME, wound, skin integrity, and UE functional use.   IMPAIRMENTS: are limiting patient from ADLs, IADLs, rest and sleep, work, leisure, and social participation.   COMORBIDITIES: may have co-morbidities  that affects occupational performance. Patient will benefit from skilled OT to address above impairments and improve overall function.  PLAN:  OT FREQUENCY: 1-2x/week  OT  DURATION: 8 weeks  PLANNED INTERVENTIONS: 97168 OT  Re-evaluation, 97535 self care/ADL training, 02889 therapeutic exercise, 97530 therapeutic activity, 97140 manual therapy, 97035 ultrasound, 97018 paraffin, 02960 fluidotherapy, 97010 moist heat, 97010 cryotherapy, 97034 contrast bath, 97032 electrical stimulation (manual), 97750 Physical Performance Testing, 02239 Orthotic Initial, [id number] Orthotic/Prosthetic subsequent, manual lymph drainage, scar mobilization, passive range of motion, functional mobility training, compression bandaging, coping strategies training, patient/family education, and DME and/or AE instructions  RECOMMENDED OTHER SERVICES: None at the time of eval  CONSULTED AND AGREED WITH PLAN OF CARE: Patient  PLAN FOR NEXT SESSION:  Review & update HEP Fluido/US  & manual therapy- for edema management  Putty exercises as swelling decreases   Rushton Early, Student-OT 02/03/2024, 4:48 PM

## 2024-02-06 ENCOUNTER — Ambulatory Visit: Payer: Self-pay | Admitting: Occupational Therapy

## 2024-02-06 DIAGNOSIS — R208 Other disturbances of skin sensation: Secondary | ICD-10-CM

## 2024-02-06 DIAGNOSIS — R29898 Other symptoms and signs involving the musculoskeletal system: Secondary | ICD-10-CM

## 2024-02-06 DIAGNOSIS — M6281 Muscle weakness (generalized): Secondary | ICD-10-CM

## 2024-02-06 DIAGNOSIS — M65949 Unspecified synovitis and tenosynovitis, unspecified hand: Secondary | ICD-10-CM

## 2024-02-06 DIAGNOSIS — R600 Localized edema: Secondary | ICD-10-CM

## 2024-02-06 NOTE — Therapy (Signed)
 OUTPATIENT OCCUPATIONAL THERAPY ORTHO TREATMENT  Patient Name: Rodney Powell MRN: [id number] DOB:April 23, 1975, 48 y.o., male Today's Date: 02/06/2024  PCP: Theotis Haze ORN, NP REFERRING PROVIDER: Arlinda Buster, MD  END OF SESSION:  OT End of Session - 02/06/24 0933     Visit Number 3    Number of Visits 16    Date for Recertification  03/27/24    Authorization Type Self- pay    OT Start Time 0933    OT Stop Time 1019    OT Time Calculation (min) 46 min    Equipment Utilized During Treatment Fluidotherapy, yellow putty    Activity Tolerance Patient tolerated treatment well    Behavior During Therapy WFL for tasks assessed/performed          Past Medical History:  Diagnosis Date   Back pain    Diabetes (HCC)    Hypertension    Past Surgical History:  Procedure Laterality Date   INCISION AND DRAINAGE OF WOUND Right 12/21/2023   Procedure: IRRIGATION AND DEBRIDEMENT WOUND;  Surgeon: Arlinda Buster, MD;  Location: [facility];  Service: Orthopedics;  Laterality: Right;  RIGHT LONG FINGER WOUND   INCISION AND DRAINAGE PERIRECTAL ABSCESS Right 01/04/2016   Procedure: IRRIGATION AND DEBRIDEMENT RIGHT BUTTOCKS  ABSCESS;  Surgeon: Krystal Spinner, MD;  Location: [facility];  Service: General;  Laterality: Right;   Patient Active Problem List   Diagnosis Date Noted   Flexor tenosynovitis of finger 12/21/2023   Abscess of finger of right hand 12/21/2023   Abscess of bursa of right hand 12/21/2023   Cellulitis 12/21/2023   Tenosynovitis of right hand 12/21/2023   Dyslipidemia 05/28/2023   Type 2 diabetes mellitus with hyperglycemia (HCC) 05/28/2023   Lumbar radiculopathy, chronic 09/25/2021   Abscess of right buttock 01/03/2016   Elevated blood sugar 01/03/2016   History of chest pain 01/03/2016    ONSET DATE:01/24/2024  REFERRING DIAG:  Diagnosis  M65.949 (ICD-10-CM) - Flexor tenosynovitis of finger  R60.0 (ICD-10-CM) - Hand edema    THERAPY DIAG:  Muscle weakness  (generalized)  Localized edema  Other disturbances of skin sensation  Other symptoms and signs involving the musculoskeletal system  Flexor tenosynovitis of finger  Rationale for Evaluation and Treatment: Rehabilitation  SUBJECTIVE:   SUBJECTIVE STATEMENT: Pt reports slight pain today, rated 4-5/10. After application of heat (fluido), pain decreased to approximately 3/10.  Pt accompanied by: self  PERTINENT HISTORY: Elevated BP, type 2 diabetes mellitus with hyperglycemia, lumbar radiculopathy, flexor tenosynovitis of finger, abscess bursa of R hand, hx of chest pain, cellulitis, and dyslipidemia  PRECAUTIONS: Back ( lumbar radiculopathy chronic)  RED FLAGS: None   WEIGHT BEARING RESTRICTIONS: No  PAIN:  Are you having pain? Yes: NPRS scale: 4-5/10 Pain location: R middle finger  Pain description: Needles and pins Aggravating factors: Moving it   FALLS: Has patient fallen in last 6 months? No  LIVING ENVIRONMENT: Lives with: lives alone Lives in: House/apartment Stairs: External (porch stairs)  Has following equipment at home: None  PLOF: Independent; The patient works at [employer] but is currently on leave due to the injury. He is still able to drive and complete his daily activities, although it takes him more time and he often compensates by using his left hand. He is currently also experiencing difficulty with writing.  PATIENT GOALS: The patient's goal is to regain mobility and strength in his hand so he can return to work.  NEXT MD VISIT: 03/02/2024  OBJECTIVE:  Note: Objective measures were completed at  Evaluation unless otherwise noted.  HAND DOMINANCE: Right  ADLs: Overall ADLs: Overall, the patient is independent with all ADLs. However, because the affected hand is the patient's dominant hand and he is experiencing pain and swelling, it takes him longer to complete tasks than usual. He often compensates by using his LUE.   Additionally, the patient has  recently resumed light household tasks such as sweeping and mopping. However, he continues to have difficulty with activities that require grip strength, such as opening jars and water bottles, due to pain when attempting to make a fist.   FUNCTIONAL OUTCOME MEASURES: Quick Dash: 77.3% disability at eval   UPPER EXTREMITY ROM:     Active ROM Right eval Left eval  Shoulder flexion    Shoulder abduction    Shoulder adduction    Shoulder extension    Shoulder internal rotation    Shoulder external rotation    Elbow flexion    Elbow extension    Wrist flexion Lacks full digital flexion when wrist is flexed WNL  Wrist extension Lacks full digital extension when wrist is extended WNL  Wrist ulnar deviation    Wrist radial deviation    Wrist pronation    Wrist supination    (Blank rows = not tested)  Active ROM Right eval Left eval  Thumb MCP (0-60)    Thumb IP (0-80)    Thumb Radial abd/add (0-55)     Thumb Palmar abd/add (0-45)     Thumb Opposition to Small Finger     Index MCP (0-90)     Index PIP (0-100)     Index DIP (0-70)      Long MCP (0-90) 50*  76*   Long PIP (0-100) 68 * 90*   Long DIP (0-70)  28* 70*   Ring MCP (0-90)      Ring PIP (0-100)      Ring DIP (0-70)      Little MCP (0-90)      Little PIP (0-100)      Little DIP (0-70)       (Blank rows = not tested)  HAND FUNCTION:  One trial only at eval 01/28/24  Grip strength: Right: 14.5 lbs; Left: 99.4 lbs (LUE started at 77 with slow climb to 99 lbs) Pt experienced R hand pain when trying to grip the dynamometer.   COORDINATION: 9 Hole Peg test: Right: 1 minute and 36 sec; Left: 36 sec Pt had difficulty with picking up pegs with R hand due to pain when having to flex middle finger; dropped 1 peg.   SENSATION: Pt reports feeling numbness on R middle finger.  EDEMA: Yes, right middle finger down to the palm area.   COGNITION: Overall cognitive status: Within functional limits for tasks  assessed  OBSERVATIONS:  The patient presents with significant swelling and some stiffness in the right middle finger, which may be contributing to his pain and limiting the functional use of his RUE.   TREATMENT: - Therapeutic exercises completed for duration as noted below including: Pt then placed B UE in Fluidotherapy machine with supervised ROM x 10 min. Pt was educated to complete tendon glides and PROM exercises during modality time to improve ROM and decrease pain/stiffness of affected extremity by use of the machine's massaging action and thermal properties.  Pt reported and demonstrated more R digit mobility after modality indicating improved ROM and verbalizing decreased pain as stated in the above subjective statement.   Initiated Putty Exercises with yellow putty  to begin strengthening, coordination and sensory stimulation of B UEs.  Patient provided visual demonstration, verbal and tactile cues as needed to improve performance of the various exercises/activities including:   - Putty Squeezes - cues to squeeze putty into log for use with other exercises and to fold putty in half with 1 hand  - Putty Rolls - encourage to roll putty into logs with sensory stimulation to entire length of hand, fingers and wrist as needed   - Pinch and Pull with Putty - this motion is combined with different pinches (3-Point Pinch, Tip Pinch, Key Pinch) - patient encouraged to combine tripod, pincer and/or key pinch with pinch and pull motion of putty pulling away from midline, changing between different pinches and changing different directions to change grip  - Finger Extension with Putty - pt shown how to work on task with all fingers and thumb as well as individual fingers in opposition to thumb  - Finger Adduction with Putty - pt shown how to work on weaving putty between fingers/thumb and then squeeze fingers together while laying hand flat on table top.  - Removing Objects from Putty  -  encouraged to hide items (coins, marble, dice etc) and use one hand at a time to find the objects and identify them by tactile input before s/he digs them out and can see them visually.  OT educated patient on theraputty recommendations: avoid small children, pets, hot environments, place in designated container and avoid contact with fabrics. Patient verbalized understanding.    Patient benefited from extra time, verbal/tactile cues, and modeling of task to allow time for processing of verbal instructions and improve motor planning of unfamiliar movements.   Patient reported slight pain and discomfort with some of the exercises listed above. However, patient verbalized that the pain was not significant enough to prevent participation or completion of the exercises.  - Self-care/home management completed for duration as noted below including:  Pt reports using his thumb to carry objects due to pain in the right middle finger, which makes it difficult to grasp items. OT provided education on joint protection, ergonomics, and body mechanics as noted in patient instructions, to reduce R UE pain and promote safe use of B UE.   PATIENT EDUCATION: Education details: Joint protection, yellow putty exercises Person educated: Patient Education method: Explanation, Demonstration, Tactile cues, Verbal cues, and Handouts Education comprehension: verbalized understanding, returned demonstration, verbal cues required, tactile cues required, and needs further education  HOME EXERCISE PROGRAM: 01/28/24:  Tendon glides, scar massage, wrist/digit exercises- Access code; [email or web address] 02/03/24: Retrograde massage, sensory precautions 02/06/24: Joint protection handout, putty exercises- access code;[email or web address]   GOALS: Goals reviewed with patient? Yes  SHORT TERM GOALS: Target date: 02/28/2024  Patient will demonstrate initial R UE HEP with 25% verbal cues or less for proper execution.  Baseline: Received tendon  glides, scar massage handout, and wrist/digit exercises at eval Goal status: IN Progress  2.  The patient will verbalize understanding of edema management strategies and demonstrate a noticeable decrease in edema in the RUE.  Baseline: Significant swelling noted in R hand (middle finger), issued compressive finger sleeve at eval Goal status: IN Progress  3.  Pt will independently recall at least 3 total joint protection, ergonomic, and body mechanic principles.   Baseline: Pt currently use R thumb to hold objects such as grocery bags.  Goal status: IN PROGRESS  4. Pt will independently recall the 5 main sensory precautions (cold, heat, sharp, chemical, and heavy) as needed  to prevent injury/harm secondary to impairments.   Baseline: Pt lacks sensation in R middle finger- received handout 02/03/24 Goal status: IN PROGRESS  5. Patient will verbalize two effective pain management techniques to use at home to improve functional use of RUE.  Baseline: Pt currently refrains from using his R hand due to the amount of pain when having to move the middle finger.  Goal status: IN PROGRESS   LONG TERM GOALS: Target date: 03/27/2024   1. Patient will demonstrate updated R UE HEP with visual handouts only for proper execution.              Baseline: New to outpt OT             Goal status: IN PROGRESS  2.  Patient will demonstrate at least 50 lbs RUE grip strength as needed to open jars and other containers.  Baseline: 14.5 lbs (R hand) ; 99.4 lbs (L hand)  Goal status: IN PROGRESS  3.  Patient will demo improved FM coordination as evidenced by completing nine-hole peg with use of RUE in 30 seconds or less.  Baseline: Right: 1 minute and 36 sec; Left: 36 sec Goal status: IN PROGRESS  4.The patient will increase AROM in digital flexion of middle finger (PIP,DIP, MCP) joints by 20 degrees in order to achieve full composite digital flexion, improving functional use of the hand during daily  activities.             Baseline: RUE: MCP 50*, PIP 68*, DIP 28* versus LUE: MCP 76*, PIP 90*, PIP 70* Goal status: IN PROGRESS  5. Patient will demonstrate at least 30% improvement with quick Dash score (reporting <50% disability or less) indicating improved functional use of affected extremity.  Baseline: 77.3% disability  Goal status: IN PROGRESS   ASSESSMENT:  CLINICAL IMPRESSION: Patient presents with pain and edema in R middle finger secondary to flexor tenosynovitis with surgical irrigation. He demonstrates good rehab potential as he is observed to have decreased swelling and improved R digit mobility since the initial eval. He will continue to benefit from skilled outpatient OT to decrease pain and swelling of RUE for improved functional use for ADLs and IADLs.   PERFORMANCE DEFICITS: in functional skills including ADLs, IADLs, coordination, dexterity, sensation, edema, ROM, strength, pain, Fine motor control, Gross motor control, endurance, decreased knowledge of precautions, decreased knowledge of use of DME, wound, skin integrity, and UE functional use.   IMPAIRMENTS: are limiting patient from ADLs, IADLs, rest and sleep, work, leisure, and social participation.   COMORBIDITIES: may have co-morbidities  that affects occupational performance. Patient will benefit from skilled OT to address above impairments and improve overall function.  PLAN:  OT FREQUENCY: 1-2x/week  OT DURATION: 8 weeks  PLANNED INTERVENTIONS: 97168 OT Re-evaluation, 97535 self care/ADL training, 02889 therapeutic exercise, 97530 therapeutic activity, 97140 manual therapy, 97035 ultrasound, 97018 paraffin, 02960 fluidotherapy, 97010 moist heat, 97010 cryotherapy, 97034 contrast bath, 97032 electrical stimulation (manual), 97750 Physical Performance Testing, 02239 Orthotic Initial, [id number] Orthotic/Prosthetic subsequent, manual lymph drainage, scar mobilization, passive range of motion, functional mobility  training, compression bandaging, coping strategies training, patient/family education, and DME and/or AE instructions  RECOMMENDED OTHER SERVICES: None at the time of eval  CONSULTED AND AGREED WITH PLAN OF CARE: Patient  PLAN FOR NEXT SESSION:  Fluido/US  & manual therapy- for edema management  IASTM/ scar massage Coordination handout/ activites for continued promotion of digit mobility  Delrick Dehart, Student-OT 02/06/2024, 11:35 AM

## 2024-02-06 NOTE — Patient Instructions (Signed)
  Access Code: [email or web address] URL: [email or web address] Date: 02/06/2024 Prepared by: [email or web address] Vasily Fedewa  Exercises - Putty Squeezes  - 1-2 x daily - 10 reps - Rolling Putty on Table  - 1-2 x daily - 10 reps - Finger Pinch and Pull with Putty  - 1-2 x daily - 10 reps - 3-Point Pinch with Putty  - 1-2 x daily - 10 reps - Tip PUSH with Putty  - 1-2 x daily - 10 reps - Key Pinch with Putty  - 1-2 x daily - 10 reps - Finger Extension with Putty  - 1-2 x daily - 10 reps - Finger Adduction with Putty  - 1-2 x daily - 10 reps - Removing Marbles from Putty  - 1-2 x daily - 10 reps

## 2024-02-10 ENCOUNTER — Ambulatory Visit: Admitting: Occupational Therapy

## 2024-02-11 ENCOUNTER — Ambulatory Visit: Payer: Self-pay | Attending: Orthopedic Surgery | Admitting: Occupational Therapy

## 2024-02-11 DIAGNOSIS — M65949 Unspecified synovitis and tenosynovitis, unspecified hand: Secondary | ICD-10-CM | POA: Insufficient documentation

## 2024-02-11 DIAGNOSIS — M6281 Muscle weakness (generalized): Secondary | ICD-10-CM | POA: Insufficient documentation

## 2024-02-11 DIAGNOSIS — R29898 Other symptoms and signs involving the musculoskeletal system: Secondary | ICD-10-CM | POA: Insufficient documentation

## 2024-02-11 DIAGNOSIS — R278 Other lack of coordination: Secondary | ICD-10-CM | POA: Insufficient documentation

## 2024-02-11 DIAGNOSIS — R600 Localized edema: Secondary | ICD-10-CM | POA: Insufficient documentation

## 2024-02-11 DIAGNOSIS — R208 Other disturbances of skin sensation: Secondary | ICD-10-CM | POA: Insufficient documentation

## 2024-02-11 DIAGNOSIS — M25641 Stiffness of right hand, not elsewhere classified: Secondary | ICD-10-CM | POA: Insufficient documentation

## 2024-02-11 DIAGNOSIS — L905 Scar conditions and fibrosis of skin: Secondary | ICD-10-CM | POA: Insufficient documentation

## 2024-02-11 NOTE — Patient Instructions (Signed)
 SABRA

## 2024-02-11 NOTE — Therapy (Signed)
 OUTPATIENT OCCUPATIONAL THERAPY ORTHO TREATMENT  Patient Name: Rodney Powell MRN: [id number] DOB:Nov 20, 1975, 48 y.o., male Today's Date: 02/11/2024  PCP: Theotis Haze ORN, NP REFERRING PROVIDER: Arlinda Buster, MD  END OF SESSION:  OT End of Session - 02/11/24 1107     Visit Number 4    Number of Visits 16    Date for Recertification  03/27/24    Authorization Type Self- pay    OT Start Time 1110    OT Stop Time 1154    OT Time Calculation (min) 44 min    Equipment Utilized During Treatment Fluidotherapy, Tissue movers, FM objects    Activity Tolerance Patient tolerated treatment well    Behavior During Therapy WFL for tasks assessed/performed          Past Medical History:  Diagnosis Date   Back pain    Diabetes (HCC)    Hypertension    Past Surgical History:  Procedure Laterality Date   INCISION AND DRAINAGE OF WOUND Right 12/21/2023   Procedure: IRRIGATION AND DEBRIDEMENT WOUND;  Surgeon: Arlinda Buster, MD;  Location: [facility];  Service: Orthopedics;  Laterality: Right;  RIGHT LONG FINGER WOUND   INCISION AND DRAINAGE PERIRECTAL ABSCESS Right 01/04/2016   Procedure: IRRIGATION AND DEBRIDEMENT RIGHT BUTTOCKS  ABSCESS;  Surgeon: Krystal Spinner, MD;  Location: [facility];  Service: General;  Laterality: Right;   Patient Active Problem List   Diagnosis Date Noted   Flexor tenosynovitis of finger 12/21/2023   Abscess of finger of right hand 12/21/2023   Abscess of bursa of right hand 12/21/2023   Cellulitis 12/21/2023   Tenosynovitis of right hand 12/21/2023   Dyslipidemia 05/28/2023   Type 2 diabetes mellitus with hyperglycemia (HCC) 05/28/2023   Lumbar radiculopathy, chronic 09/25/2021   Abscess of right buttock 01/03/2016   Elevated blood sugar 01/03/2016   History of chest pain 01/03/2016    ONSET DATE:01/24/2024  REFERRING DIAG:  Diagnosis  M65.949 (ICD-10-CM) - Flexor tenosynovitis of finger  R60.0 (ICD-10-CM) - Hand edema    THERAPY DIAG:  Muscle weakness  (generalized)  Localized edema  Other disturbances of skin sensation  Other symptoms and signs involving the musculoskeletal system  Other lack of coordination  Flexor tenosynovitis of finger  Rationale for Evaluation and Treatment: Rehabilitation  SUBJECTIVE:   SUBJECTIVE STATEMENT: Pt reports varied pain today, ie) he woke up no pain but as he started getting dressed, his hand swoll up and pain went up to 7 /10.  Pt has been massaging it since then and the swelling has gone down and so has the pain ie) down to 2-3 /10 but making a fist makes it uncomfortable.  He does report that he is getting the feeling back in is finger.  Pt accompanied by: self  PERTINENT HISTORY: Elevated BP, type 2 diabetes mellitus with hyperglycemia, lumbar radiculopathy, flexor tenosynovitis of finger, abscess bursa of R hand, hx of chest pain, cellulitis, and dyslipidemia  PRECAUTIONS: Back ( lumbar radiculopathy chronic)  RED FLAGS: None   WEIGHT BEARING RESTRICTIONS: No  PAIN:  Are you having pain? Yes: NPRS scale: No pain when he woke up but when he started moving it it went up to 7 /10 so then he began massaging it and swelling has gone down and pain is about a 2-3 /10, although making fist make the pain increase Pain location: R middle finger  Pain description: stiff and achy Aggravating factors: Moving it  FALLS: Has patient fallen in last 6 months? No  LIVING ENVIRONMENT:  Lives with: lives alone Lives in: House/apartment Stairs: External (porch stairs)  Has following equipment at home: None  PLOF: Independent; The patient works at [employer] but is currently on leave due to the injury. He is still able to drive and complete his daily activities, although it takes him more time and he often compensates by using his left hand. He is currently also experiencing difficulty with writing.  PATIENT GOALS: The patient's goal is to regain mobility and strength in his hand so he can return to  work.  NEXT MD VISIT: 03/02/2024  OBJECTIVE:  Note: Objective measures were completed at Evaluation unless otherwise noted.  HAND DOMINANCE: Right  ADLs: Overall ADLs: Overall, the patient is independent with all ADLs. However, because the affected hand is the patient's dominant hand and he is experiencing pain and swelling, it takes him longer to complete tasks than usual. He often compensates by using his LUE.   Additionally, the patient has recently resumed light household tasks such as sweeping and mopping. However, he continues to have difficulty with activities that require grip strength, such as opening jars and water bottles, due to pain when attempting to make a fist.   FUNCTIONAL OUTCOME MEASURES: Quick Dash: 77.3% disability at eval   UPPER EXTREMITY ROM:     Active ROM Right eval Left eval  Shoulder flexion    Shoulder abduction    Shoulder adduction    Shoulder extension    Shoulder internal rotation    Shoulder external rotation    Elbow flexion    Elbow extension    Wrist flexion Lacks full digital flexion when wrist is flexed WNL  Wrist extension Lacks full digital extension when wrist is extended WNL  Wrist ulnar deviation    Wrist radial deviation    Wrist pronation    Wrist supination    (Blank rows = not tested)  Active ROM Right eval Left eval  Thumb MCP (0-60)    Thumb IP (0-80)    Thumb Radial abd/add (0-55)     Thumb Palmar abd/add (0-45)     Thumb Opposition to Small Finger     Index MCP (0-90)     Index PIP (0-100)     Index DIP (0-70)      Long MCP (0-90) 50*  76*   Long PIP (0-100) 68 * 90*   Long DIP (0-70)  28* 70*   Ring MCP (0-90)      Ring PIP (0-100)      Ring DIP (0-70)      Little MCP (0-90)      Little PIP (0-100)      Little DIP (0-70)       (Blank rows = not tested)  HAND FUNCTION:  One trial only at eval 01/28/24  Grip strength: Right: 14.5 lbs; Left: 99.4 lbs (LUE started at 77 with slow climb to 99 lbs) Pt  experienced R hand pain when trying to grip the dynamometer.   COORDINATION: 9 Hole Peg test: Right: 1 minute and 36 sec; Left: 36 sec Pt had difficulty with picking up pegs with R hand due to pain when having to flex middle finger; dropped 1 peg.   SENSATION: Pt reports feeling numbness on R middle finger.  EDEMA: Yes, right middle finger down to the palm area.   COGNITION: Overall cognitive status: Within functional limits for tasks assessed  OBSERVATIONS:  The patient presents with significant swelling and some stiffness in the right middle finger, which may be  contributing to his pain and limiting the functional use of his RUE.   TODAY'S TREATMENT:  - Manual therapy completed for duration as noted below including: Therapist provided manual techniques and educated pt in scar massage at healed site of incisions x 4 for reduction of scar tissue, to promote improved AROM and pain reduction of affected surgical site. Improved appearance to incision noted upon completion with less palpable adhesions and therefore improved ROM.  Pt provided instruction re: massaging scar in three directions: circles, side to side, and up and down with return demonstration sought and handout provided per pt instruction.  Performed dynamic cupping followed by IASTM with tissue movers at site of incision and surrounding area to decrease scar tissue adhesions by mobilizing the soft- tissues such as skin, fascia, neural tissues, muscles, ligaments and tendons and to promote local circulation necessary for optimal functional movement by lifting and separating the tissue underneath the cup. Improved appearance to incision noted upon completion with less palpable adhesions.  - Therapeutic exercises completed for duration as noted below including: Pt then placed B UE in Fluidotherapy machine with supervised ROM x 10 min. Pt was educated to complete tendon glides and finger gripping during modality time to improve ROM and  decrease pain/stiffness of affected extremity by use of the machine's massaging action and thermal properties.  Pt reported and demonstrated more R digit mobility after modality indicating improved ROM and verbalizing decreased pain as stated in the above subjective statement.   - Therapeutic activities completed for duration as noted below including:  Introduced Coordination Activities with handout with images provided for various activities to work on R UE middle finger ROM, dexterity and isolated movements with demonstration and practice, as well as modification, hand over hand guidance and cues throughout to improve technique, digital isolation and ease of performing task.  Tasks included:  Pick up coins, checkers, dice and objects of different sizes ... To place in containers To stack - with guidance to work on include/isolate specific fingers. To pick up items one at a time until patient got 5+ in their hand and then move item from palm to fingertips to release ie) Finger-to-palm then palm-to-finger translation of small items - Options to vary difficulty include using a washcloth under items like coins or using larger items (checkers vs coins or blocks/dominos vs dice) for increased ease of picking up items.  Shuffling, Flipping and dealing cards 1 at a time. -- Patient educated on shuffling, dealing and sorting cards for solitaire and other card games to work improved finger coordination    Rotate golf balls (clockwise and counter-clockwise) with forearm pronated and balls on table or supinated and balls in hand.   Twirl pen/cil between fingers. - Encouragement to isolate fingers individually and twirl (rotation) or flipping and shift up and down the pen (translation) to get it in position for writing or erasing.    Tear a piece of paper towel and roll it into small balls with fingertips ie) straw wrapping when eating out.    Patient is encouraged to take breaks, relax arm/shoulder by  supporting forearm, minimize compensatory motions and a try different activities throughout the day/week including games like Londa (for the dice), card games, Connect 4 etc.   Patient benefited from extra time, verbal/tactile cues, and modeling of task to allow time for processing of verbal instructions and improve motor planning of unfamiliar movements.   PATIENT EDUCATION: Education details: Scar massage and coordination activities Person educated: Patient Education method: Explanation,  Demonstration, Tactile cues, Verbal cues, and Handouts Education comprehension: verbalized understanding, returned demonstration, verbal cues required, tactile cues required, and needs further education  HOME EXERCISE PROGRAM: 01/28/24:  Tendon glides, scar massage, wrist/digit exercises- Access code; [email or web address] 02/03/24: Retrograde massage, sensory precautions 02/06/24: Joint protection handout, putty exercises- access [email or web address] 02/11/24: Coordination activities    GOALS: Goals reviewed with patient? Yes  SHORT TERM GOALS: Target date: 02/28/2024  Patient will demonstrate initial R UE HEP with 25% verbal cues or less for proper execution.  Baseline: Received tendon glides, scar massage handout, and wrist/digit exercises at eval Goal status: MET  2.  The patient will verbalize understanding of edema management strategies and demonstrate a noticeable decrease in edema in the RUE.  Baseline: Significant swelling noted in R hand (middle finger), issued compressive finger sleeve at eval Goal status: IN Progress  3.  Pt will independently recall at least 3 total joint protection, ergonomic, and body mechanic principles.   Baseline: Pt currently use R thumb to hold objects such as grocery bags.  Goal status: IN PROGRESS  4. Pt will independently recall the 5 main sensory precautions (cold, heat, sharp, chemical, and heavy) as needed to prevent injury/harm secondary to impairments.   Baseline: Pt  lacks sensation in R middle finger- received handout 02/03/24 Goal status: IN PROGRESS  5. Patient will verbalize two effective pain management techniques to use at home to improve functional use of RUE.  Baseline: Pt currently refrains from using his R hand due to the amount of pain when having to move the middle finger.  Goal status: IN PROGRESS   LONG TERM GOALS: Target date: 03/27/2024   1. Patient will demonstrate updated R UE HEP with visual handouts only for proper execution.              Baseline: New to outpt OT             Goal status: IN PROGRESS  2.  Patient will demonstrate at least 50 lbs RUE grip strength as needed to open jars and other containers.  Baseline: 14.5 lbs (R hand) ; 99.4 lbs (L hand)  Goal status: IN PROGRESS  3.  Patient will demo improved FM coordination as evidenced by completing nine-hole peg with use of RUE in 30 seconds or less.  Baseline: Right: 1 minute and 36 sec; Left: 36 sec Goal status: IN PROGRESS  4.The patient will increase AROM in digital flexion of middle finger (PIP,DIP, MCP) joints by 20 degrees in order to achieve full composite digital flexion, improving functional use of the hand during daily activities.             Baseline: RUE: MCP 50*, PIP 68*, DIP 28* versus LUE: MCP 76*, PIP 90*, PIP 70* Goal status: IN PROGRESS  5. Patient will demonstrate at least 30% improvement with quick Dash score (reporting <50% disability or less) indicating improved functional use of affected extremity.  Baseline: 77.3% disability  Goal status: IN PROGRESS   ASSESSMENT:  CLINICAL IMPRESSION: Patient presents with improvement in pain and edema in R middle finger secondary to flexor tenosynovitis with surgical irrigation. He is using massage on his own to help improve comfort of his finger and demonstrates good rehab potential as he is cooperative and interested in different activities to promote R digit mobility. He will continue to benefit from  skilled outpatient OT to decrease pain and swelling of RUE for improved functional use for ADLs and IADLs.   PERFORMANCE DEFICITS: in functional skills  including ADLs, IADLs, coordination, dexterity, sensation, edema, ROM, strength, pain, Fine motor control, Gross motor control, endurance, decreased knowledge of precautions, decreased knowledge of use of DME, wound, skin integrity, and UE functional use.   IMPAIRMENTS: are limiting patient from ADLs, IADLs, rest and sleep, work, leisure, and social participation.   COMORBIDITIES: may have co-morbidities  that affects occupational performance. Patient will benefit from skilled OT to address above impairments and improve overall function.  PLAN:  OT FREQUENCY: 1-2x/week  OT DURATION: 8 weeks  PLANNED INTERVENTIONS: 97168 OT Re-evaluation, 97535 self care/ADL training, 02889 therapeutic exercise, 97530 therapeutic activity, 97140 manual therapy, 97035 ultrasound, 97018 paraffin, 02960 fluidotherapy, 97010 moist heat, 97010 cryotherapy, 97034 contrast bath, 97032 electrical stimulation (manual), 97750 Physical Performance Testing, 02239 Orthotic Initial, [id number] Orthotic/Prosthetic subsequent, manual lymph drainage, scar mobilization, passive range of motion, functional mobility training, compression bandaging, coping strategies training, patient/family education, and DME and/or AE instructions  RECOMMENDED OTHER SERVICES: None at the time of eval  CONSULTED AND AGREED WITH PLAN OF CARE: Patient  PLAN FOR NEXT SESSION:  Fluido/US  & manual therapy- for edema management  IASTM/ scar massage Review and practice more of the activities on the Coordination handout for continued promotion of digit mobility  Clarita LITTIE Pride, OT 02/11/2024, 5:30 PM

## 2024-02-12 ENCOUNTER — Ambulatory Visit: Payer: Self-pay | Admitting: Occupational Therapy

## 2024-02-12 DIAGNOSIS — R208 Other disturbances of skin sensation: Secondary | ICD-10-CM

## 2024-02-12 DIAGNOSIS — R29898 Other symptoms and signs involving the musculoskeletal system: Secondary | ICD-10-CM

## 2024-02-12 DIAGNOSIS — R600 Localized edema: Secondary | ICD-10-CM

## 2024-02-12 DIAGNOSIS — M25641 Stiffness of right hand, not elsewhere classified: Secondary | ICD-10-CM

## 2024-02-12 DIAGNOSIS — R278 Other lack of coordination: Secondary | ICD-10-CM

## 2024-02-12 DIAGNOSIS — M6281 Muscle weakness (generalized): Secondary | ICD-10-CM

## 2024-02-12 DIAGNOSIS — M65949 Unspecified synovitis and tenosynovitis, unspecified hand: Secondary | ICD-10-CM

## 2024-02-12 NOTE — Therapy (Unsigned)
 OUTPATIENT OCCUPATIONAL THERAPY ORTHO TREATMENT  Patient Name: Rodney Powell MRN: [id number] DOB:February 07, 1976, 48 y.o., male Today's Date: 02/12/2024  PCP: Theotis Haze ORN, NP REFERRING PROVIDER: Arlinda Buster, MD  END OF SESSION:  OT End of Session - 02/12/24 1450     Visit Number 5    Number of Visits 16    Date for Recertification  03/27/24    Authorization Type Self- pay    OT Start Time 1450    OT Stop Time 1530    OT Time Calculation (min) 40 min    Equipment Utilized During Treatment Fluidotherapy, Tissue movers    Activity Tolerance Patient tolerated treatment well    Behavior During Therapy WFL for tasks assessed/performed          Past Medical History:  Diagnosis Date   Back pain    Diabetes (HCC)    Hypertension    Past Surgical History:  Procedure Laterality Date   INCISION AND DRAINAGE OF WOUND Right 12/21/2023   Procedure: IRRIGATION AND DEBRIDEMENT WOUND;  Surgeon: Arlinda Buster, MD;  Location: [facility];  Service: Orthopedics;  Laterality: Right;  RIGHT LONG FINGER WOUND   INCISION AND DRAINAGE PERIRECTAL ABSCESS Right 01/04/2016   Procedure: IRRIGATION AND DEBRIDEMENT RIGHT BUTTOCKS  ABSCESS;  Surgeon: Krystal Spinner, MD;  Location: [facility];  Service: General;  Laterality: Right;   Patient Active Problem List   Diagnosis Date Noted   Flexor tenosynovitis of finger 12/21/2023   Abscess of finger of right hand 12/21/2023   Abscess of bursa of right hand 12/21/2023   Cellulitis 12/21/2023   Tenosynovitis of right hand 12/21/2023   Dyslipidemia 05/28/2023   Type 2 diabetes mellitus with hyperglycemia (HCC) 05/28/2023   Lumbar radiculopathy, chronic 09/25/2021   Abscess of right buttock 01/03/2016   Elevated blood sugar 01/03/2016   History of chest pain 01/03/2016    ONSET DATE:01/24/2024  REFERRING DIAG:  Diagnosis  M65.949 (ICD-10-CM) - Flexor tenosynovitis of finger  R60.0 (ICD-10-CM) - Hand edema    THERAPY DIAG:  Muscle weakness  (generalized)  Stiffness of right hand, not elsewhere classified  Localized edema  Other disturbances of skin sensation  Other symptoms and signs involving the musculoskeletal system  Other lack of coordination  Flexor tenosynovitis of finger  Rationale for Evaluation and Treatment: Rehabilitation  SUBJECTIVE:   SUBJECTIVE STATEMENT: Pt reports varied pain but again in the morning as he started getting dressed again s/p getting washed off.  He does not notice the same issues in the evening after his shower but is only donning night clothes.  Pain went up to 7-8 /10.  Pt has been massaging it since then and the swelling has gone down and so has the pain ie) down to 6 /10 upon arrival.  Pt accompanied by: self  PERTINENT HISTORY: Elevated BP, type 2 diabetes mellitus with hyperglycemia, lumbar radiculopathy, flexor tenosynovitis of finger, abscess bursa of R hand, hx of chest pain, cellulitis, and dyslipidemia  PRECAUTIONS: Back ( lumbar radiculopathy chronic)  RED FLAGS: None   WEIGHT BEARING RESTRICTIONS: No  PAIN:  Are you having pain? Yes:  NPRS scale: 6/10 upon arrival; s/p fluido ~ 4/10  Pain description: stiff and achy Aggravating factors: Moving it  FALLS: Has patient fallen in last 6 months? No  LIVING ENVIRONMENT: Lives with: lives alone Lives in: House/apartment Stairs: External (porch stairs)  Has following equipment at home: None  PLOF: Independent; The patient works at [employer] but is currently on leave due to the injury.  He is still able to drive and complete his daily activities, although it takes him more time and he often compensates by using his left hand. He is currently also experiencing difficulty with writing.  PATIENT GOALS: The patient's goal is to regain mobility and strength in his hand so he can return to work.  NEXT MD VISIT: 03/02/2024  OBJECTIVE:  Note: Objective measures were completed at Evaluation unless otherwise noted.  HAND  DOMINANCE: Right  ADLs: Overall ADLs: Overall, the patient is independent with all ADLs. However, because the affected hand is the patient's dominant hand and he is experiencing pain and swelling, it takes him longer to complete tasks than usual. He often compensates by using his LUE.   Additionally, the patient has recently resumed light household tasks such as sweeping and mopping. However, he continues to have difficulty with activities that require grip strength, such as opening jars and water bottles, due to pain when attempting to make a fist.   FUNCTIONAL OUTCOME MEASURES: Quick Dash: 77.3% disability at eval   UPPER EXTREMITY ROM:     Active ROM Right eval Left eval  Shoulder flexion    Shoulder abduction    Shoulder adduction    Shoulder extension    Shoulder internal rotation    Shoulder external rotation    Elbow flexion    Elbow extension    Wrist flexion Lacks full digital flexion when wrist is flexed WNL  Wrist extension Lacks full digital extension when wrist is extended WNL  Wrist ulnar deviation    Wrist radial deviation    Wrist pronation    Wrist supination    (Blank rows = not tested)  Active ROM Right eval Left eval  Thumb MCP (0-60)    Thumb IP (0-80)    Thumb Radial abd/add (0-55)     Thumb Palmar abd/add (0-45)     Thumb Opposition to Small Finger     Index MCP (0-90)     Index PIP (0-100)     Index DIP (0-70)      Long MCP (0-90) 50*  76*   Long PIP (0-100) 68 * 90*   Long DIP (0-70)  28* 70*   Ring MCP (0-90)      Ring PIP (0-100)      Ring DIP (0-70)      Little MCP (0-90)      Little PIP (0-100)      Little DIP (0-70)       (Blank rows = not tested)  HAND FUNCTION:  One trial only at eval 01/28/24  Grip strength: Right: 14.5 lbs; Left: 99.4 lbs (LUE started at 77 with slow climb to 99 lbs) Pt experienced R hand pain when trying to grip the dynamometer.   COORDINATION: 9 Hole Peg test: Right: 1 minute and 36 sec; Left: 36  sec Pt had difficulty with picking up pegs with R hand due to pain when having to flex middle finger; dropped 1 peg.   SENSATION: Pt reports feeling numbness on R middle finger.  EDEMA: Yes, right middle finger down to the palm area.   COGNITION: Overall cognitive status: Within functional limits for tasks assessed  OBSERVATIONS:  The patient presents with significant swelling and some stiffness in the right middle finger, which may be contributing to his pain and limiting the functional use of his RUE.   TODAY'S TREATMENT:  - Therapeutic exercises completed for duration as noted below including: Pt then placed B UE in Fluidotherapy machine  with supervised ROM x 10 min. Pt was educated to complete digital ROM for finger flexion/gripping during modality time to improve ROM and decrease pain/stiffness of affected extremity by use of the machine's massaging action and thermal properties.  Pt reported and demonstrated more R digit mobility after modality indicating improved ROM and verbalizing decreased pain from 6/10 upon arrival to 4/10 at end of modality. Following scar massage further AAROM activities completed to help progress composite fist flexion.  - Manual therapy completed for duration as noted below including: Therapist provided manual techniques and progressed scar massage at healed site of incisions x 3/4 areas for reduction of scar tissue and subsequently to promote improved AROM and pain reduction of affected surgical site. Improved softness, texture and mobility of each scar noted.  OTR performed dynamic cupping followed by IASTM with tissue movers at site of incisions and surrounding area to decrease scar tissue adhesions by mobilizing the skin, fascia, neural tissues, muscles, ligaments and tendons and to promote local circulation necessary for optimal functional movement by lifting and separating the tissue underneath the cup. Improved texture of all incisions noted upon completion  with less palpable adhesions.  - Splint assessment completed for future consideration ie) hand based finger extension splint and addition of elastomer insert to help with further scar remodeling with options explored from glove with elastomer and then decided up hand based splint     PATIENT EDUCATION: Education details: Scar massage and coordination activities Person educated: Patient Education method: Explanation, Demonstration, Tactile cues, Verbal cues, and Handouts Education comprehension: verbalized understanding, returned demonstration, verbal cues required, tactile cues required, and needs further education  HOME EXERCISE PROGRAM: 01/28/24:  Tendon glides, scar massage, wrist/digit exercises- Access code; [email or web address] 02/03/24: Retrograde massage, sensory precautions 02/06/24: Joint protection handout, putty exercises- access code;[email or web address] 02/11/24: Coordination activities    GOALS: Goals reviewed with patient? Yes  SHORT TERM GOALS: Target date: 02/28/2024  Patient will demonstrate initial R UE HEP with 25% verbal cues or less for proper execution.  Baseline: Received tendon glides, scar massage handout, and wrist/digit exercises at eval Goal status: MET  2.  The patient will verbalize understanding of edema management strategies and demonstrate a noticeable decrease in edema in the RUE.  Baseline: Significant swelling noted in R hand (middle finger), issued compressive finger sleeve at eval Goal status: IN Progress  3.  Pt will independently recall at least 3 total joint protection, ergonomic, and body mechanic principles.   Baseline: Pt currently use R thumb to hold objects such as grocery bags.  Goal status: IN PROGRESS  4. Pt will independently recall the 5 main sensory precautions (cold, heat, sharp, chemical, and heavy) as needed to prevent injury/harm secondary to impairments.   Baseline: Pt lacks sensation in R middle finger- received handout 02/03/24 Goal status: IN  PROGRESS  5. Patient will verbalize two effective pain management techniques to use at home to improve functional use of RUE.  Baseline: Pt currently refrains from using his R hand due to the amount of pain when having to move the middle finger.  Goal status: IN PROGRESS   LONG TERM GOALS: Target date: 03/27/2024   1. Patient will demonstrate updated R UE HEP with visual handouts only for proper execution.              Baseline: New to outpt OT             Goal status: IN PROGRESS  2.  Patient will demonstrate at least 50 lbs  RUE grip strength as needed to open jars and other containers.  Baseline: 14.5 lbs (R hand) ; 99.4 lbs (L hand)  Goal status: IN PROGRESS  3.  Patient will demo improved FM coordination as evidenced by completing nine-hole peg with use of RUE in 30 seconds or less.  Baseline: Right: 1 minute and 36 sec; Left: 36 sec Goal status: IN PROGRESS  4.The patient will increase AROM in digital flexion of middle finger (PIP,DIP, MCP) joints by 20 degrees in order to achieve full composite digital flexion, improving functional use of the hand during daily activities.             Baseline: RUE: MCP 50*, PIP 68*, DIP 28* versus LUE: MCP 76*, PIP 90*, PIP 70* Goal status: IN PROGRESS  5. Patient will demonstrate at least 30% improvement with quick Dash score (reporting <50% disability or less) indicating improved functional use of affected extremity.  Baseline: 77.3% disability  Goal status: IN PROGRESS   ASSESSMENT:  CLINICAL IMPRESSION: Patient presents with improvement in pain and edema in R middle finger secondary to flexor tenosynovitis with surgical irrigation. He is using massage on his own to help improve comfort of his finger and demonstrates good rehab potential as he is cooperative and interested in different activities to promote R digit mobility. He will continue to benefit from skilled outpatient OT to decrease pain and swelling of RUE for improved functional  use for ADLs and IADLs.   PERFORMANCE DEFICITS: in functional skills including ADLs, IADLs, coordination, dexterity, sensation, edema, ROM, strength, pain, Fine motor control, Gross motor control, endurance, decreased knowledge of precautions, decreased knowledge of use of DME, wound, skin integrity, and UE functional use.   IMPAIRMENTS: are limiting patient from ADLs, IADLs, rest and sleep, work, leisure, and social participation.   COMORBIDITIES: may have co-morbidities  that affects occupational performance. Patient will benefit from skilled OT to address above impairments and improve overall function.  PLAN:  OT FREQUENCY: 1-2x/week  OT DURATION: 8 weeks  PLANNED INTERVENTIONS: 97168 OT Re-evaluation, 97535 self care/ADL training, 02889 therapeutic exercise, 97530 therapeutic activity, 97140 manual therapy, 97035 ultrasound, 97018 paraffin, 02960 fluidotherapy, 97010 moist heat, 97010 cryotherapy, 97034 contrast bath, 97032 electrical stimulation (manual), 97750 Physical Performance Testing, 02239 Orthotic Initial, [id number] Orthotic/Prosthetic subsequent, manual lymph drainage, scar mobilization, passive range of motion, functional mobility training, compression bandaging, coping strategies training, patient/family education, and DME and/or AE instructions  RECOMMENDED OTHER SERVICES: None at the time of eval  CONSULTED AND AGREED WITH PLAN OF CARE: Patient  PLAN FOR NEXT SESSION:  Finger extension splint for middle finger  Fluido/US  & manual therapy- for edema management  IASTM/ scar massage Review and practice more of the activities on the Coordination handout for continued promotion of digit mobility  Clarita LITTIE Pride, OT 02/12/2024, 7:32 PM

## 2024-02-17 ENCOUNTER — Ambulatory Visit: Payer: Self-pay | Admitting: Occupational Therapy

## 2024-02-17 DIAGNOSIS — R600 Localized edema: Secondary | ICD-10-CM

## 2024-02-17 DIAGNOSIS — R29898 Other symptoms and signs involving the musculoskeletal system: Secondary | ICD-10-CM

## 2024-02-17 DIAGNOSIS — M25641 Stiffness of right hand, not elsewhere classified: Secondary | ICD-10-CM

## 2024-02-17 DIAGNOSIS — M6281 Muscle weakness (generalized): Secondary | ICD-10-CM

## 2024-02-17 DIAGNOSIS — M65949 Unspecified synovitis and tenosynovitis, unspecified hand: Secondary | ICD-10-CM

## 2024-02-17 DIAGNOSIS — R278 Other lack of coordination: Secondary | ICD-10-CM

## 2024-02-17 DIAGNOSIS — R208 Other disturbances of skin sensation: Secondary | ICD-10-CM

## 2024-02-17 NOTE — Therapy (Signed)
 OUTPATIENT OCCUPATIONAL THERAPY ORTHO TREATMENT  Patient Name: Rodney Powell MRN: [id number] DOB:1975/05/28, 48 y.o., male Today's Date: 02/17/2024  PCP: Theotis Haze ORN, NP REFERRING PROVIDER: Arlinda Buster, MD  END OF SESSION:  OT End of Session - 02/17/24 1056     Visit Number 5    Number of Visits 16    Date for Recertification  03/27/24    Authorization Type Self- pay    OT Start Time 1100    OT Stop Time 1203    OT Time Calculation (min) 63 min    Equipment Utilized During Treatment IASTM, Splinting materials    Activity Tolerance Patient tolerated treatment well    Behavior During Therapy WFL for tasks assessed/performed          Past Medical History:  Diagnosis Date   Back pain    Diabetes (HCC)    Hypertension    Past Surgical History:  Procedure Laterality Date   INCISION AND DRAINAGE OF WOUND Right 12/21/2023   Procedure: IRRIGATION AND DEBRIDEMENT WOUND;  Surgeon: Arlinda Buster, MD;  Location: [facility];  Service: Orthopedics;  Laterality: Right;  RIGHT LONG FINGER WOUND   INCISION AND DRAINAGE PERIRECTAL ABSCESS Right 01/04/2016   Procedure: IRRIGATION AND DEBRIDEMENT RIGHT BUTTOCKS  ABSCESS;  Surgeon: Krystal Spinner, MD;  Location: [facility];  Service: General;  Laterality: Right;   Patient Active Problem List   Diagnosis Date Noted   Flexor tenosynovitis of finger 12/21/2023   Abscess of finger of right hand 12/21/2023   Abscess of bursa of right hand 12/21/2023   Cellulitis 12/21/2023   Tenosynovitis of right hand 12/21/2023   Dyslipidemia 05/28/2023   Type 2 diabetes mellitus with hyperglycemia (HCC) 05/28/2023   Lumbar radiculopathy, chronic 09/25/2021   Abscess of right buttock 01/03/2016   Elevated blood sugar 01/03/2016   History of chest pain 01/03/2016    ONSET DATE:01/24/2024  REFERRING DIAG:  Diagnosis  M65.949 (ICD-10-CM) - Flexor tenosynovitis of finger  R60.0 (ICD-10-CM) - Hand edema    THERAPY DIAG:  Muscle weakness  (generalized)  Stiffness of right hand, not elsewhere classified  Localized edema  Other disturbances of skin sensation  Other symptoms and signs involving the musculoskeletal system  Other lack of coordination  Flexor tenosynovitis of finger  Rationale for Evaluation and Treatment: Rehabilitation  SUBJECTIVE:   SUBJECTIVE STATEMENT: The patient reports increased pain in the R hand today, rated 7/10, noting that the pain was severe overnight and interfered with rest. He reports that he may have overused his hand during weekend activities, including cooking, cleaning, raking, and cutting food.   Pt accompanied by: self  PERTINENT HISTORY: Elevated BP, type 2 diabetes mellitus with hyperglycemia, lumbar radiculopathy, flexor tenosynovitis of finger, abscess bursa of R hand, hx of chest pain, cellulitis, and dyslipidemia  PRECAUTIONS: Back ( lumbar radiculopathy chronic)  RED FLAGS: None   WEIGHT BEARING RESTRICTIONS: No  PAIN:  Are you having pain? Yes:  NPRS scale:7/10 Pain description: stiff and achy Aggravating factors: Moving it  FALLS: Has patient fallen in last 6 months? No  LIVING ENVIRONMENT: Lives with: lives alone Lives in: House/apartment Stairs: External (porch stairs)  Has following equipment at home: None  PLOF: Independent; The patient works at [employer] but is currently on leave due to the injury. He is still able to drive and complete his daily activities, although it takes him more time and he often compensates by using his left hand. He is currently also experiencing difficulty with writing.  PATIENT GOALS:  The patient's goal is to regain mobility and strength in his hand so he can return to work.  NEXT MD VISIT: 03/02/2024  OBJECTIVE:  Note: Objective measures were completed at Evaluation unless otherwise noted.  HAND DOMINANCE: Right  ADLs: Overall ADLs: Overall, the patient is independent with all ADLs. However, because the affected hand  is the patient's dominant hand and he is experiencing pain and swelling, it takes him longer to complete tasks than usual. He often compensates by using his LUE.   Additionally, the patient has recently resumed light household tasks such as sweeping and mopping. However, he continues to have difficulty with activities that require grip strength, such as opening jars and water bottles, due to pain when attempting to make a fist.   FUNCTIONAL OUTCOME MEASURES: Quick Dash: 77.3% disability at eval   UPPER EXTREMITY ROM:     Active ROM Right eval Left eval  Shoulder flexion    Shoulder abduction    Shoulder adduction    Shoulder extension    Shoulder internal rotation    Shoulder external rotation    Elbow flexion    Elbow extension    Wrist flexion Lacks full digital flexion when wrist is flexed WNL  Wrist extension Lacks full digital extension when wrist is extended WNL  Wrist ulnar deviation    Wrist radial deviation    Wrist pronation    Wrist supination    (Blank rows = not tested)  Active ROM Right eval Left eval  Thumb MCP (0-60)    Thumb IP (0-80)    Thumb Radial abd/add (0-55)     Thumb Palmar abd/add (0-45)     Thumb Opposition to Small Finger     Index MCP (0-90)     Index PIP (0-100)     Index DIP (0-70)      Long MCP (0-90) 50*  76*   Long PIP (0-100) 68 * 90*   Long DIP (0-70)  28* 70*   Ring MCP (0-90)      Ring PIP (0-100)      Ring DIP (0-70)      Little MCP (0-90)      Little PIP (0-100)      Little DIP (0-70)       (Blank rows = not tested)  HAND FUNCTION:  One trial only at eval 01/28/24  Grip strength: Right: 14.5 lbs; Left: 99.4 lbs (LUE started at 77 with slow climb to 99 lbs) Pt experienced R hand pain when trying to grip the dynamometer.   COORDINATION: 9 Hole Peg test: Right: 1 minute and 36 sec; Left: 36 sec Pt had difficulty with picking up pegs with R hand due to pain when having to flex middle finger; dropped 1 peg.    SENSATION: Pt reports feeling numbness on R middle finger.  EDEMA: Yes, right middle finger down to the palm area.   COGNITION: Overall cognitive status: Within functional limits for tasks assessed  OBSERVATIONS:  The patient presents with significant swelling and some stiffness in the right middle finger, which may be contributing to his pain and limiting the functional use of his RUE.   TODAY'S TREATMENT:  - Manual therapy completed for duration as noted below including: Therapist provided manual techniques and progressed scar massage at healed site of incisions x 3/4 areas for reduction of scar tissue and subsequently to promote improved AROM and pain reduction of affected surgical site. Improved softness, texture and mobility of each scar noted.  OTR  performed dynamic cupping followed by IASTM with tissue movers at site of incisions and surrounding area to decrease scar tissue adhesions by mobilizing the skin, fascia, neural tissues, muscles, ligaments and tendons and to promote local circulation necessary for optimal functional movement by lifting and separating the tissue underneath the cup. Improved texture of all incisions noted upon completion with less palpable adhesions.  In addition, OTR able to maintain much better suction along the dorsal phalanx scar today than yesterday and the distal palpable knot is much reduced and easier to move across.  - Orthotic Initial completed for duration as noted below including:  OT fabricated a hand-based finger full extension orthosis for the R middle finger to support tissues in a neutral position to reduce swelling and promote proper alignment. In addition, elastomer inserts were created and placed at the dorsal finger scar, DIP volar surface, and MCP/web space to apply pressure at scar sites, promoting flattened tissue and increased scar pliability. The index finger and thumb were left free from the splint to allow the patient to grasp or pick up  objects as needed during the night  OT educated the patient on splint wear and care, encouraging use of the splint with elastomer inserts at night to stretch the middle finger into a functional position, maintain joint alignment, manage edema, and provide pressure for scar management at targeted sites.Pt verbalized understanding and reported that the splint felt comfortable and tolerable. In addition, pt was able to independently demonstrate donning and doffing of orthosis with no additional questions or concerns at this time. OT to monitor splint in future sessions for modifications or adjustments prn.   PATIENT EDUCATION: Education details: Scar massage, splint wear and care Person educated: Patient Education method: Explanation, Demonstration, Tactile cues, Verbal cues, and Handouts Education comprehension: verbalized understanding, returned demonstration, verbal cues required, tactile cues required, and needs further education  HOME EXERCISE PROGRAM: 01/28/24:  Tendon glides, scar massage, wrist/digit exercises- Access code; [email or web address] 02/03/24: Retrograde massage, sensory precautions 02/06/24: Joint protection handout, putty exercises- access code;[email or web address] 02/11/24: Coordination activities  02/17/24: Splint wear (hand-based finger full extension orthosis)   GOALS: Goals reviewed with patient? Yes  SHORT TERM GOALS: Target date: 02/28/2024  Patient will demonstrate initial R UE HEP with 25% verbal cues or less for proper execution.  Baseline: Received tendon glides, scar massage handout, and wrist/digit exercises at eval Goal status: MET  2.  The patient will verbalize understanding of edema management strategies and demonstrate a noticeable decrease in edema in the RUE.  Baseline: Significant swelling noted in R hand (middle finger), issued compressive finger sleeve at eval Goal status: IN Progress  3.  Pt will independently recall at least 3 total joint protection, ergonomic, and  body mechanic principles.   Baseline: Pt currently use R thumb to hold objects such as grocery bags.  Goal status: IN PROGRESS  4. Pt will independently recall the 5 main sensory precautions (cold, heat, sharp, chemical, and heavy) as needed to prevent injury/harm secondary to impairments.   Baseline: Pt lacks sensation in R middle finger- received handout 02/03/24 Goal status: IN PROGRESS  5. Patient will verbalize two effective pain management techniques to use at home to improve functional use of RUE.  Baseline: Pt currently refrains from using his R hand due to the amount of pain when having to move the middle finger.  Goal status: IN PROGRESS   LONG TERM GOALS: Target date: 03/27/2024   1. Patient will demonstrate updated R UE HEP with visual  handouts only for proper execution.              Baseline: New to outpt OT             Goal status: IN PROGRESS  2.  Patient will demonstrate at least 50 lbs RUE grip strength as needed to open jars and other containers.  Baseline: 14.5 lbs (R hand) ; 99.4 lbs (L hand)  Goal status: IN PROGRESS  3.  Patient will demo improved FM coordination as evidenced by completing nine-hole peg with use of RUE in 30 seconds or less.  Baseline: Right: 1 minute and 36 sec; Left: 36 sec Goal status: IN PROGRESS  4.The patient will increase AROM in digital flexion of middle finger (PIP,DIP, MCP) joints by 20 degrees in order to achieve full composite digital flexion, improving functional use of the hand during daily activities.             Baseline: RUE: MCP 50*, PIP 68*, DIP 28* versus LUE: MCP 76*, PIP 90*, PIP 70* Goal status: IN PROGRESS  5. Patient will demonstrate at least 30% improvement with quick Dash score (reporting <50% disability or less) indicating improved functional use of affected extremity.  Baseline: 77.3% disability  Goal status: IN PROGRESS   ASSESSMENT:  CLINICAL IMPRESSION: Patient presents with improvement in texture of all  scars today s/p manual techniques from last session and today. He also demonstrates understanding of splint wear and care  with elastomer inserts for further scar improvements.  He will continue to benefit from skilled outpatient OT to decrease pain and swelling of RUE for improved functional use for ADLs and IADLs.   PERFORMANCE DEFICITS: in functional skills including ADLs, IADLs, coordination, dexterity, sensation, edema, ROM, strength, pain, Fine motor control, Gross motor control, endurance, decreased knowledge of precautions, decreased knowledge of use of DME, wound, skin integrity, and UE functional use.   IMPAIRMENTS: are limiting patient from ADLs, IADLs, rest and sleep, work, leisure, and social participation.   COMORBIDITIES: may have co-morbidities  that affects occupational performance. Patient will benefit from skilled OT to address above impairments and improve overall function.  PLAN:  OT FREQUENCY: 1-2x/week  OT DURATION: 8 weeks  PLANNED INTERVENTIONS: 97168 OT Re-evaluation, 97535 self care/ADL training, 02889 therapeutic exercise, 97530 therapeutic activity, 97140 manual therapy, 97035 ultrasound, 97018 paraffin, 02960 fluidotherapy, 97010 moist heat, 97010 cryotherapy, 97034 contrast bath, 97032 electrical stimulation (manual), 97750 Physical Performance Testing, 02239 Orthotic Initial, [id number] Orthotic/Prosthetic subsequent, manual lymph drainage, scar mobilization, passive range of motion, functional mobility training, compression bandaging, coping strategies training, patient/family education, and DME and/or AE instructions  RECOMMENDED OTHER SERVICES: None at the time of eval  CONSULTED AND AGREED WITH PLAN OF CARE: Patient  PLAN FOR NEXT SESSION:  Finger extension splint for middle finger adjustments prn Fluido/US  & manual therapy- for edema management  IASTM/ scar massage Review and practice more of the activities on the Coordination handout for continued promotion of  digit mobility  Karinne Schmader, Student-OT 02/17/2024, 1:55 PM

## 2024-02-18 ENCOUNTER — Ambulatory Visit: Payer: Self-pay | Admitting: Occupational Therapy

## 2024-02-18 DIAGNOSIS — M25641 Stiffness of right hand, not elsewhere classified: Secondary | ICD-10-CM

## 2024-02-18 DIAGNOSIS — R600 Localized edema: Secondary | ICD-10-CM

## 2024-02-18 DIAGNOSIS — R29898 Other symptoms and signs involving the musculoskeletal system: Secondary | ICD-10-CM

## 2024-02-18 DIAGNOSIS — R278 Other lack of coordination: Secondary | ICD-10-CM

## 2024-02-18 DIAGNOSIS — M65949 Unspecified synovitis and tenosynovitis, unspecified hand: Secondary | ICD-10-CM

## 2024-02-18 DIAGNOSIS — M6281 Muscle weakness (generalized): Secondary | ICD-10-CM

## 2024-02-18 NOTE — Therapy (Signed)
 OUTPATIENT OCCUPATIONAL THERAPY ORTHO TREATMENT  Patient Name: Rodney Powell MRN: [id number] DOB:[birth year], 48 y.o., male Today's Date: 02/18/2024  PCP: Theotis Haze ORN, NP REFERRING PROVIDER: Arlinda Buster, MD  END OF SESSION:  OT End of Session - 02/18/24 1106     Visit Number 6    Number of Visits 16    Date for Recertification  03/27/24    Authorization Type Self- pay    OT Start Time 1105    OT Stop Time 1145    OT Time Calculation (min) 40 min    Equipment Utilized During Treatment Ultrasound, IASTM    Activity Tolerance Patient tolerated treatment well    Behavior During Therapy WFL for tasks assessed/performed          Past Medical History:  Diagnosis Date   Back pain    Diabetes (HCC)    Hypertension    Past Surgical History:  Procedure Laterality Date   INCISION AND DRAINAGE OF WOUND Right 12/21/2023   Procedure: IRRIGATION AND DEBRIDEMENT WOUND;  Surgeon: Arlinda Buster, MD;  Location: [facility];  Service: Orthopedics;  Laterality: Right;  RIGHT LONG FINGER WOUND   INCISION AND DRAINAGE PERIRECTAL ABSCESS Right 01/04/2016   Procedure: IRRIGATION AND DEBRIDEMENT RIGHT BUTTOCKS  ABSCESS;  Surgeon: Krystal Spinner, MD;  Location: [facility];  Service: General;  Laterality: Right;   Patient Active Problem List   Diagnosis Date Noted   Flexor tenosynovitis of finger 12/21/2023   Abscess of finger of right hand 12/21/2023   Abscess of bursa of right hand 12/21/2023   Cellulitis 12/21/2023   Tenosynovitis of right hand 12/21/2023   Dyslipidemia 05/28/2023   Type 2 diabetes mellitus with hyperglycemia (HCC) 05/28/2023   Lumbar radiculopathy, chronic 09/25/2021   Abscess of right buttock 01/03/2016   Elevated blood sugar 01/03/2016   History of chest pain 01/03/2016    ONSET DATE:01/24/2024  REFERRING DIAG:  Diagnosis  M65.949 (ICD-10-CM) - Flexor tenosynovitis of finger  R60.0 (ICD-10-CM) - Hand edema    THERAPY DIAG:  Stiffness of right hand, not  elsewhere classified  Localized edema  Other symptoms and signs involving the musculoskeletal system  Muscle weakness (generalized)  Other lack of coordination  Flexor tenosynovitis of finger  Rationale for Evaluation and Treatment: Rehabilitation  SUBJECTIVE:   SUBJECTIVE STATEMENT: The patient reports reprots he did wear the splint last night and he did not need any adjustments to it.  He reported his pain was not bad but his finger was a bit swollen this morning   Pt accompanied by: self  PERTINENT HISTORY: Elevated BP, type 2 diabetes mellitus with hyperglycemia, lumbar radiculopathy, flexor tenosynovitis of finger, abscess bursa of R hand, hx of chest pain, cellulitis, and dyslipidemia  PRECAUTIONS: Back ( lumbar radiculopathy chronic)  RED FLAGS: None   WEIGHT BEARING RESTRICTIONS: No  PAIN:  Are you having pain? Yes:  NPRS scale:3/10 Pain description: Not bad at all Aggravating factors: Moving it  FALLS: Has patient fallen in last 6 months? No  LIVING ENVIRONMENT: Lives with: lives alone Lives in: House/apartment Stairs: External (porch stairs)  Has following equipment at home: None  PLOF: Independent; The patient works at [employer] but is currently on leave due to the injury. He is still able to drive and complete his daily activities, although it takes him more time and he often compensates by using his left hand. He is currently also experiencing difficulty with writing.  PATIENT GOALS: The patient's goal is to regain mobility and strength in his  hand so he can return to work.  NEXT MD VISIT: 03/02/2024  OBJECTIVE:  Note: Objective measures were completed at Evaluation unless otherwise noted.  HAND DOMINANCE: Right  ADLs: Overall ADLs: Overall, the patient is independent with all ADLs. However, because the affected hand is the patient's dominant hand and he is experiencing pain and swelling, it takes him longer to complete tasks than usual. He often  compensates by using his LUE.   Additionally, the patient has recently resumed light household tasks such as sweeping and mopping. However, he continues to have difficulty with activities that require grip strength, such as opening jars and water bottles, due to pain when attempting to make a fist.   FUNCTIONAL OUTCOME MEASURES: Quick Dash: 77.3% disability at eval   UPPER EXTREMITY ROM:     Active ROM Right eval Left eval  Shoulder flexion    Shoulder abduction    Shoulder adduction    Shoulder extension    Shoulder internal rotation    Shoulder external rotation    Elbow flexion    Elbow extension    Wrist flexion Lacks full digital flexion when wrist is flexed WNL  Wrist extension Lacks full digital extension when wrist is extended WNL  Wrist ulnar deviation    Wrist radial deviation    Wrist pronation    Wrist supination    (Blank rows = not tested)  Active ROM Right eval Left eval  Thumb MCP (0-60)    Thumb IP (0-80)    Thumb Radial abd/add (0-55)     Thumb Palmar abd/add (0-45)     Thumb Opposition to Small Finger     Index MCP (0-90)     Index PIP (0-100)     Index DIP (0-70)      Long MCP (0-90) 50*  76*   Long PIP (0-100) 68 * 90*   Long DIP (0-70)  28* 70*   Ring MCP (0-90)      Ring PIP (0-100)      Ring DIP (0-70)      Little MCP (0-90)      Little PIP (0-100)      Little DIP (0-70)       (Blank rows = not tested)  HAND FUNCTION:  One trial only at eval 01/28/24  Grip strength: Right: 14.5 lbs; Left: 99.4 lbs (LUE started at 77 with slow climb to 99 lbs) Pt experienced R hand pain when trying to grip the dynamometer.   COORDINATION: 9 Hole Peg test: Right: 1 minute and 36 sec; Left: 36 sec Pt had difficulty with picking up pegs with R hand due to pain when having to flex middle finger; dropped 1 peg.   SENSATION: Pt reports feeling numbness on R middle finger.  EDEMA: Yes, right middle finger down to the palm area.   COGNITION: Overall  cognitive status: Within functional limits for tasks assessed  OBSERVATIONS:  The patient presents with significant swelling and some stiffness in the right middle finger, which may be contributing to his pain and limiting the functional use of his RUE.   TODAY'S TREATMENT:  - Ultrasound completed for duration as noted below including:  Ultrasound applied to palmar and dorsal right middle finger for 8 minutes, frequency of 3 MHz, 20% duty cycle, and 1.1 W/cm with pt's arm placed on soft towel for promotion of ROM, edema reduction, and pain reduction in affected extremity.  - Manual therapy completed for duration as noted below including: Therapist provided manual techniques  and progressed scar massage at healed site of incisions x 3/4 areas for reduction of scar tissue and subsequently to promote improved AROM and pain reduction of affected surgical site. Improved softness, texture and mobility of each scar continues to be noted.  OTR performed dynamic cupping followed by IASTM with tissue movers at site of incisions and surrounding area to decrease scar tissue adhesions by mobilizing the skin, fascia, neural tissues, muscles, ligaments and tendons and to promote local circulation necessary for optimal functional movement by lifting and separating the tissue underneath the cup. Improved texture of all incisions noted upon completion with less palpable adhesions.   - Therapeutic exercises completed for duration as noted below including: Pt engaged in PROM and AAROM activities to address loss of joint motion in R digits through stretching and positioning to increase tissue extensibility and increase muscular relaxation with ultimate goal of improved ROM and decreased pain for progression grip position with eventual success with touching finger pads to base of the palm of hand.    PATIENT EDUCATION: Education details: Scar massage, PROM Person educated: Patient Education method: Explanation,  Demonstration, Tactile cues, and Verbal cues Education comprehension: verbalized understanding, returned demonstration, verbal cues required, tactile cues required, and needs further education  HOME EXERCISE PROGRAM: 01/28/24:  Tendon glides, scar massage, wrist/digit exercises- Access code; [email or web address] 02/03/24: Retrograde massage, sensory precautions 02/06/24: Joint protection handout, putty exercises- access code;[email or web address] 02/11/24: Coordination activities  02/17/24: Splint wear (hand-based finger full extension orthosis)   GOALS: Goals reviewed with patient? Yes  SHORT TERM GOALS: Target date: 02/28/2024  Patient will demonstrate initial R UE HEP with 25% verbal cues or less for proper execution.  Baseline: Received tendon glides, scar massage handout, and wrist/digit exercises at eval Goal status: MET  2.  The patient will verbalize understanding of edema management strategies and demonstrate a noticeable decrease in edema in the RUE.  Baseline: Significant swelling noted in R hand (middle finger), issued compressive finger sleeve at eval Goal status: IN Progress  3.  Pt will independently recall at least 3 total joint protection, ergonomic, and body mechanic principles.   Baseline: Pt currently use R thumb to hold objects such as grocery bags.  Goal status: IN PROGRESS  4. Pt will independently recall the 5 main sensory precautions (cold, heat, sharp, chemical, and heavy) as needed to prevent injury/harm secondary to impairments.   Baseline: Pt lacks sensation in R middle finger- received handout 02/03/24 Goal status: IN PROGRESS  5. Patient will verbalize two effective pain management techniques to use at home to improve functional use of RUE.  Baseline: Pt currently refrains from using his R hand due to the amount of pain when having to move the middle finger.  Goal status: IN PROGRESS   LONG TERM GOALS: Target date: 03/27/2024   1. Patient will demonstrate updated R UE HEP  with visual handouts only for proper execution.              Baseline: New to outpt OT             Goal status: IN PROGRESS  2.  Patient will demonstrate at least 50 lbs RUE grip strength as needed to open jars and other containers.  Baseline: 14.5 lbs (R hand) ; 99.4 lbs (L hand)  Goal status: IN PROGRESS  3.  Patient will demo improved FM coordination as evidenced by completing nine-hole peg with use of RUE in 30 seconds or less.  Baseline: Right: 1 minute and 36 sec;  Left: 36 sec Goal status: IN PROGRESS  4.The patient will increase AROM in digital flexion of middle finger (PIP,DIP, MCP) joints by 20 degrees in order to achieve full composite digital flexion, improving functional use of the hand during daily activities.             Baseline: RUE: MCP 50*, PIP 68*, DIP 28* versus LUE: MCP 76*, PIP 90*, PIP 70* Goal status: IN PROGRESS  5. Patient will demonstrate at least 30% improvement with quick Dash score (reporting <50% disability or less) indicating improved functional use of affected extremity.  Baseline: 77.3% disability  Goal status: IN PROGRESS   ASSESSMENT:  CLINICAL IMPRESSION: Patient presented initially with some edema in his middle finger today but had improvement in swelling as well as texture of all scars today s/p ultrasound and manual techniques today. He was also provided with a edema sleeve for the finger to help with swelling.  He will continue to benefit from skilled outpatient OT to decrease pain and swelling of RUE for improved functional use for ADLs and IADLs.   PERFORMANCE DEFICITS: in functional skills including ADLs, IADLs, coordination, dexterity, sensation, edema, ROM, strength, pain, Fine motor control, Gross motor control, endurance, decreased knowledge of precautions, decreased knowledge of use of DME, wound, skin integrity, and UE functional use.   IMPAIRMENTS: are limiting patient from ADLs, IADLs, rest and sleep, work, leisure, and social  participation.   COMORBIDITIES: may have co-morbidities  that affects occupational performance. Patient will benefit from skilled OT to address above impairments and improve overall function.  PLAN:  OT FREQUENCY: 1-2x/week  OT DURATION: 8 weeks  PLANNED INTERVENTIONS: 97168 OT Re-evaluation, 97535 self care/ADL training, 02889 therapeutic exercise, 97530 therapeutic activity, 97140 manual therapy, 97035 ultrasound, 97018 paraffin, 02960 fluidotherapy, 97010 moist heat, 97010 cryotherapy, 97034 contrast bath, 97032 electrical stimulation (manual), 97750 Physical Performance Testing, 02239 Orthotic Initial, [id number] Orthotic/Prosthetic subsequent, manual lymph drainage, scar mobilization, passive range of motion, functional mobility training, compression bandaging, coping strategies training, patient/family education, and DME and/or AE instructions  RECOMMENDED OTHER SERVICES: None at the time of eval  CONSULTED AND AGREED WITH PLAN OF CARE: Patient  PLAN FOR NEXT SESSION:  Fluido/US  & manual therapy- for edema management  IASTM/ scar massage Review and practice more of the activities on the Coordination handout for continued promotion of digit mobility  Clarita LITTIE Pride, OT 02/18/2024, 7:13 PM

## 2024-02-24 ENCOUNTER — Ambulatory Visit: Payer: Self-pay | Admitting: Occupational Therapy

## 2024-02-24 DIAGNOSIS — R278 Other lack of coordination: Secondary | ICD-10-CM

## 2024-02-24 DIAGNOSIS — R600 Localized edema: Secondary | ICD-10-CM

## 2024-02-24 DIAGNOSIS — M6281 Muscle weakness (generalized): Secondary | ICD-10-CM

## 2024-02-24 DIAGNOSIS — R208 Other disturbances of skin sensation: Secondary | ICD-10-CM

## 2024-02-24 DIAGNOSIS — M25641 Stiffness of right hand, not elsewhere classified: Secondary | ICD-10-CM

## 2024-02-24 DIAGNOSIS — M65949 Unspecified synovitis and tenosynovitis, unspecified hand: Secondary | ICD-10-CM

## 2024-02-24 DIAGNOSIS — R29898 Other symptoms and signs involving the musculoskeletal system: Secondary | ICD-10-CM

## 2024-02-24 NOTE — Therapy (Signed)
 OUTPATIENT OCCUPATIONAL THERAPY ORTHO TREATMENT  Patient Name: Rodney Powell MRN: [id number] DOB:06-10-1975, 48 y.o., male Today's Date: 02/24/2024  PCP: Theotis Haze ORN, NP REFERRING PROVIDER: Arlinda Buster, MD  END OF SESSION:  OT End of Session - 02/24/24 1105     Visit Number 7    Number of Visits 16    Date for Recertification  03/27/24    Authorization Type Self- pay    OT Start Time 1105    OT Stop Time 1150    OT Time Calculation (min) 45 min    Equipment Utilized During Treatment Fluido, IASTM    Activity Tolerance Patient tolerated treatment well    Behavior During Therapy WFL for tasks assessed/performed          Past Medical History:  Diagnosis Date   Back pain    Diabetes (HCC)    Hypertension    Past Surgical History:  Procedure Laterality Date   INCISION AND DRAINAGE OF WOUND Right 12/21/2023   Procedure: IRRIGATION AND DEBRIDEMENT WOUND;  Surgeon: Arlinda Buster, MD;  Location: [facility];  Service: Orthopedics;  Laterality: Right;  RIGHT LONG FINGER WOUND   INCISION AND DRAINAGE PERIRECTAL ABSCESS Right 01/04/2016   Procedure: IRRIGATION AND DEBRIDEMENT RIGHT BUTTOCKS  ABSCESS;  Surgeon: Krystal Spinner, MD;  Location: [facility];  Service: General;  Laterality: Right;   Patient Active Problem List   Diagnosis Date Noted   Flexor tenosynovitis of finger 12/21/2023   Abscess of finger of right hand 12/21/2023   Abscess of bursa of right hand 12/21/2023   Cellulitis 12/21/2023   Tenosynovitis of right hand 12/21/2023   Dyslipidemia 05/28/2023   Type 2 diabetes mellitus with hyperglycemia (HCC) 05/28/2023   Lumbar radiculopathy, chronic 09/25/2021   Abscess of right buttock 01/03/2016   Elevated blood sugar 01/03/2016   History of chest pain 01/03/2016    ONSET DATE:01/24/2024  REFERRING DIAG:  Diagnosis  M65.949 (ICD-10-CM) - Flexor tenosynovitis of finger  R60.0 (ICD-10-CM) - Hand edema    THERAPY DIAG:  Stiffness of right hand, not elsewhere  classified  Localized edema  Muscle weakness (generalized)  Other lack of coordination  Other symptoms and signs involving the musculoskeletal system  Flexor tenosynovitis of finger  Other disturbances of skin sensation  Rationale for Evaluation and Treatment: Rehabilitation  SUBJECTIVE:   SUBJECTIVE STATEMENT: The patient reports wearing his splint and elastomer inserts around his scars without any difficulty.   He reported his pain was not bad ie) mostly just achy and not really on the 0-10 scale and the swelling his down but he did feel stiff this morning.   Pt reports increased discomfort or pain related to increased activity/ overdoing it.      Pt accompanied by: self  PERTINENT HISTORY: Elevated BP, type 2 diabetes mellitus with hyperglycemia, lumbar radiculopathy, flexor tenosynovitis of finger, abscess bursa of R hand, hx of chest pain, cellulitis, and dyslipidemia  PRECAUTIONS: Back ( lumbar radiculopathy chronic)  RED FLAGS: None   WEIGHT BEARING RESTRICTIONS: No  PAIN:  Are you having pain? achy and tight when moving it  NPRS scale: N/A  Pain description: Not bad at all Aggravating factors: Moving it  FALLS: Has patient fallen in last 6 months? No  LIVING ENVIRONMENT: Lives with: lives alone Lives in: House/apartment Stairs: External (porch stairs)  Has following equipment at home: None  PLOF: Independent; The patient works at [employer] but is currently on leave due to the injury. He is still able to drive and complete  his daily activities, although it takes him more time and he often compensates by using his left hand. He is currently also experiencing difficulty with writing.  PATIENT GOALS: The patient's goal is to regain mobility and strength in his hand so he can return to work.  NEXT MD VISIT: 03/02/2024  OBJECTIVE:  Note: Objective measures were completed at Evaluation unless otherwise noted.  HAND DOMINANCE: Right  ADLs: Overall ADLs:  Overall, the patient is independent with all ADLs. However, because the affected hand is the patient's dominant hand and he is experiencing pain and swelling, it takes him longer to complete tasks than usual. He often compensates by using his LUE.   Additionally, the patient has recently resumed light household tasks such as sweeping and mopping. However, he continues to have difficulty with activities that require grip strength, such as opening jars and water bottles, due to pain when attempting to make a fist.   FUNCTIONAL OUTCOME MEASURES: Quick Dash: 77.3% disability at eval 02/24/24: 20.5 %   UPPER EXTREMITY ROM:     Active ROM Right eval Left eval  Shoulder flexion    Shoulder abduction    Shoulder adduction    Shoulder extension    Shoulder internal rotation    Shoulder external rotation    Elbow flexion    Elbow extension    Wrist flexion Lacks full digital flexion when wrist is flexed WNL  Wrist extension Lacks full digital extension when wrist is extended WNL  Wrist ulnar deviation    Wrist radial deviation    Wrist pronation    Wrist supination    (Blank rows = not tested)  Active ROM Right eval Right 02/24/24 Left eval  Thumb MCP (0-60)     Thumb IP (0-80)     Thumb Radial abd/add (0-55)      Thumb Palmar abd/add (0-45)      Thumb Opposition to Small Finger      Index MCP (0-90)      Index PIP (0-100)      Index DIP (0-70)       Long MCP (0-90) 50*  60* 76*   Long PIP (0-100) 68 * 85* -13* ext  90*   Long DIP (0-70)  28* 58* 70*   Ring MCP (0-90)       Ring PIP (0-100)       Ring DIP (0-70)       Little MCP (0-90)       Little PIP (0-100)       Little DIP (0-70)        (Blank rows = not tested)  HAND FUNCTION:  One trial only at eval 01/28/24  Grip strength: Right: 14.5 lbs; Left: 99.4 lbs (LUE started at 77 with slow climb to 99 lbs) Pt experienced R hand pain when trying to grip the dynamometer.   02/24/24: Right: 42.3, 37.2, 31.7 lbs Left:  87.7, 82.8, 86.6 lbs Average: Right: 37.1 lbs; Left: 85.8 lbs  COORDINATION: 9 Hole Peg test: Right: 1 minute and 36 sec; Left: 36 sec Pt had difficulty with picking up pegs with R hand due to pain when having to flex middle finger; dropped 1 peg.   02/24/24 Right: Right: 29.80 sec Left 31.29 sec  SENSATION: Eval: Pt reports feeling numbness on R middle finger.  02/24/24: Volar surface of hand at scar sites remains achy/uncomfortable.  EDEMA: Yes, right middle finger down to the palm area.   COGNITION: Overall cognitive status: Within functional limits for  tasks assessed  OBSERVATIONS:  The patient presents with significant swelling and some stiffness in the right middle finger, which may be contributing to his pain and limiting the functional use of his RUE.   TODAY'S TREATMENT:  - Manual therapy completed for duration as noted below including: Therapist provided manual techniques and progressed scar massage at healed site of incisions x 2/4 areas (dorsal prox phalanx and index/middle finger webspace) for reduction of scar tissue and subsequently to promote improved AROM and pain reduction of affected surgical site. Improved softness, texture and mobility of each scar continues to be noted.  OTR performed IASTM with tissue movers at site of incisions and surrounding area to decrease scar tissue adhesions by mobilizing the skin, fascia, neural tissues, muscles, ligaments and tendons and to promote local circulation necessary for optimal functional movement by separating the tissue. Improved texture of all incisions noted upon completion with less palpable adhesions.   - Therapeutic exercises completed for duration as noted below including: Therapist reviewed goals with patient and updated patient progression.    Grip strength up from tolerating only 1 trial @ 14.5 lbs at eval to Average over 3 rep of 37.1 lbs today although each trial was less than the previous trial and spanned 42.3 to  31.7 lbs  Pt able to perform 9 hole peg test in < 30 seconds today with RUE (quicker than LUE) but still with some clumsy dropping of items.    Pt placed RUE in Fluidotherapy machine with supervised AROM x 10 min. Pt was educated to complete AROM during modality time to improve ROM and decrease pain/stiffness of affected extremity by use of the machine's massaging action and thermal properties.   Pt engaged in [place] activities to address loss of joint motion in R digits through stretching, joint distraction and positioning to increase tissue extensibility and increase muscular relaxation with ultimate goal of improved ROM and decreased pain for progression grip position with eventual success with touching finger pads to base of the palm of hand.    ROM measurements take to compare EVAL to today with good improvement, although he still lacks some full extension of the PIP joint.    Long MCP (0-90) 50*  60*  Long PIP (0-100) 68 * 85* -13* ext   Long DIP (0-70)  28* 58*    PATIENT EDUCATION: Education details: ROM, Person educated: Patient Education method: Explanation, Demonstration, Tactile cues, and Verbal cues Education comprehension: verbalized understanding, returned demonstration, verbal cues required, tactile cues required, and needs further education  HOME EXERCISE PROGRAM: 01/28/24:  Tendon glides, scar massage, wrist/digit exercises- Access code; [email or web address] 02/03/24: Retrograde massage, sensory precautions 02/06/24: Joint protection handout, putty exercises- access code;[email or web address] 02/11/24: Coordination activities  02/17/24: Splint wear (hand-based finger full extension orthosis)   GOALS: Goals reviewed with patient? Yes  SHORT TERM GOALS: Target date: 02/28/2024  Patient will demonstrate initial R UE HEP with 25% verbal cues or less for proper execution.  Baseline: Received tendon glides, scar massage handout, and wrist/digit exercises at eval Goal status: MET  2.  The  patient will verbalize understanding of edema management strategies and demonstrate a noticeable decrease in edema in the RUE.  Baseline: Significant swelling noted in R hand (middle finger), issued compressive finger sleeve at eval Goal status: MET  3.  Pt will independently recall at least 3 total joint protection, ergonomic, and body mechanic principles.   Baseline: Pt currently use R thumb to hold objects such as grocery bags.  Goal status:  MET  4. Pt will independently recall the 5 main sensory precautions (cold, heat, sharp, chemical, and heavy) as needed to prevent injury/harm secondary to impairments.   Baseline: Pt lacks sensation in R middle finger- received handout 02/03/24 Goal status: MET  5. Patient will verbalize two effective pain management techniques to use at home to improve functional use of RUE.  Baseline: Pt currently refrains from using his R hand due to the amount of pain when having to move the middle finger.  Goal status: MET Take break/sleep; no meds; warm water; elevation, compression   LONG TERM GOALS: Target date: 03/27/2024   1. Patient will demonstrate updated R UE HEP with visual handouts only for proper execution.              Baseline: New to outpt OT             Goal status: IN PROGRESS  2.  Patient will demonstrate at least 50 lbs RUE grip strength as needed to open jars and other containers.  Baseline: 14.5 lbs (R hand) ; 99.4 lbs (L hand)  Goal status: IN PROGRESS Average: Right: 37.1 lbs; Left: 85.8 lbs  3.  Patient will demo improved FM coordination as evidenced by completing nine-hole peg with use of RUE in 30 seconds or less.  Baseline: Right: 1 minute and 36 sec; Left: 36 sec Goal status: MET  4.The patient will increase AROM in digital flexion of middle finger (PIP,DIP, MCP) joints by 20 degrees in order to achieve full composite digital flexion, improving functional use of the hand during daily activities.             Baseline: RUE: MCP  50*, PIP 68*, DIP 28* versus LUE: MCP 76*, PIP 90*, PIP 70* Goal status: IN PROGRESS RUE MCP 60* PIP 85* DIP 58*  5. Patient will demonstrate at least 30% improvement with quick Dash score (reporting <50% disability or less) indicating improved functional use of affected extremity.  Baseline: 77.3% disability  Goal status: MET 02/24/24: 20.5 %   ASSESSMENT:  CLINICAL IMPRESSION: Patient is a 48 y.o. male who was seen today for occupational therapy treatment for flexor tenosynovitis of finger. Pt has met 5/5 short term goals and 2/5 long term goals. Pt's edema was much improved from last session but still present compared to opposite extremity therefore continued edema management and progression of splinting explored. He will continue to benefit from skilled outpatient OT to decrease pain and swelling of RUE for improved functional use for ADLs and IADLs.   PERFORMANCE DEFICITS: in functional skills including ADLs, IADLs, coordination, dexterity, sensation, edema, ROM, strength, pain, Fine motor control, Gross motor control, endurance, decreased knowledge of precautions, decreased knowledge of use of DME, wound, skin integrity, and UE functional use.   IMPAIRMENTS: are limiting patient from ADLs, IADLs, rest and sleep, work, leisure, and social participation.   COMORBIDITIES: may have co-morbidities  that affects occupational performance. Patient will benefit from skilled OT to address above impairments and improve overall function.  PLAN:  OT FREQUENCY: 1-2x/week  OT DURATION: 8 weeks  PLANNED INTERVENTIONS: 97168 OT Re-evaluation, 97535 self care/ADL training, 02889 therapeutic exercise, 97530 therapeutic activity, 97140 manual therapy, 97035 ultrasound, 97018 paraffin, 02960 fluidotherapy, 97010 moist heat, 97010 cryotherapy, 97034 contrast bath, 97032 electrical stimulation (manual), 97750 Physical Performance Testing, 02239 Orthotic Initial, [id number] Orthotic/Prosthetic subsequent, manual  lymph drainage, scar mobilization, passive range of motion, functional mobility training, compression bandaging, coping strategies training, patient/family education, and DME and/or AE instructions  RECOMMENDED OTHER SERVICES: None at the time of eval  CONSULTED AND AGREED WITH PLAN OF CARE: Patient  PLAN FOR NEXT SESSION:  Fluido/US  & manual therapy- for edema management  IASTM/ scar massage Review and practice more of the activities on the Coordination handout for continued promotion of digit mobility Pt encouraged to bring splint to extend digit further.  Clarita LITTIE Pride, OT 02/24/2024, 12:44 PM

## 2024-02-25 ENCOUNTER — Ambulatory Visit: Payer: Self-pay | Admitting: Occupational Therapy

## 2024-02-25 DIAGNOSIS — L905 Scar conditions and fibrosis of skin: Secondary | ICD-10-CM

## 2024-02-25 DIAGNOSIS — R600 Localized edema: Secondary | ICD-10-CM

## 2024-02-25 DIAGNOSIS — M65949 Unspecified synovitis and tenosynovitis, unspecified hand: Secondary | ICD-10-CM

## 2024-02-25 DIAGNOSIS — M25641 Stiffness of right hand, not elsewhere classified: Secondary | ICD-10-CM

## 2024-02-25 DIAGNOSIS — M6281 Muscle weakness (generalized): Secondary | ICD-10-CM

## 2024-02-25 NOTE — Therapy (Signed)
 OUTPATIENT OCCUPATIONAL THERAPY ORTHO TREATMENT  Patient Name: Rodney Powell MRN: [id number] DOB:1976/03/20, 48 y.o., male Today's Date: 02/25/2024  PCP: Theotis Haze ORN, NP REFERRING PROVIDER: Arlinda Buster, MD  END OF SESSION:  OT End of Session - 02/25/24 1022     Visit Number 8    Number of Visits 16    Date for Recertification  03/27/24    Authorization Type Self- pay    OT Start Time 1020    OT Stop Time 1100    OT Time Calculation (min) 40 min    Equipment Utilized During Treatment IASTM    Activity Tolerance Patient tolerated treatment well    Behavior During Therapy WFL for tasks assessed/performed          Past Medical History:  Diagnosis Date   Back pain    Diabetes (HCC)    Hypertension    Past Surgical History:  Procedure Laterality Date   INCISION AND DRAINAGE OF WOUND Right 12/21/2023   Procedure: IRRIGATION AND DEBRIDEMENT WOUND;  Surgeon: Arlinda Buster, MD;  Location: [facility];  Service: Orthopedics;  Laterality: Right;  RIGHT LONG FINGER WOUND   INCISION AND DRAINAGE PERIRECTAL ABSCESS Right 01/04/2016   Procedure: IRRIGATION AND DEBRIDEMENT RIGHT BUTTOCKS  ABSCESS;  Surgeon: Krystal Spinner, MD;  Location: [facility];  Service: General;  Laterality: Right;   Patient Active Problem List   Diagnosis Date Noted   Flexor tenosynovitis of finger 12/21/2023   Abscess of finger of right hand 12/21/2023   Abscess of bursa of right hand 12/21/2023   Cellulitis 12/21/2023   Tenosynovitis of right hand 12/21/2023   Dyslipidemia 05/28/2023   Type 2 diabetes mellitus with hyperglycemia (HCC) 05/28/2023   Lumbar radiculopathy, chronic 09/25/2021   Abscess of right buttock 01/03/2016   Elevated blood sugar 01/03/2016   History of chest pain 01/03/2016    ONSET DATE:01/24/2024  REFERRING DIAG:  Diagnosis  M65.949 (ICD-10-CM) - Flexor tenosynovitis of finger  R60.0 (ICD-10-CM) - Hand edema    THERAPY DIAG:  Stiffness of right hand, not elsewhere  classified  Localized edema  Scar condition and fibrosis of skin  Muscle weakness (generalized)  Flexor tenosynovitis of finger  Rationale for Evaluation and Treatment: Rehabilitation  SUBJECTIVE:   SUBJECTIVE STATEMENT: The patient brought his splint today for OTR to assess extension.  He reports no real pain or discomfort today.      Pt accompanied by: self  PERTINENT HISTORY: Elevated BP, type 2 diabetes mellitus with hyperglycemia, lumbar radiculopathy, flexor tenosynovitis of finger, abscess bursa of R hand, hx of chest pain, cellulitis, and dyslipidemia  PRECAUTIONS: Back ( lumbar radiculopathy chronic)  RED FLAGS: None   WEIGHT BEARING RESTRICTIONS: No  PAIN:  Are you having pain? Just achy and tight when moving it  NPRS scale: N/A  Pain description: Not bad at all Aggravating factors: Moving it  FALLS: Has patient fallen in last 6 months? No  LIVING ENVIRONMENT: Lives with: lives alone Lives in: House/apartment Stairs: External (porch stairs)  Has following equipment at home: None  PLOF: Independent; The patient works at [employer] but is currently on leave due to the injury. He is still able to drive and complete his daily activities, although it takes him more time and he often compensates by using his left hand. He is currently also experiencing difficulty with writing.  PATIENT GOALS: The patient's goal is to regain mobility and strength in his hand so he can return to work.  NEXT MD VISIT: 03/02/2024  OBJECTIVE:  Note: Objective measures were completed at Evaluation unless otherwise noted.  HAND DOMINANCE: Right  ADLs: Overall ADLs: Overall, the patient is independent with all ADLs. However, because the affected hand is the patient's dominant hand and he is experiencing pain and swelling, it takes him longer to complete tasks than usual. He often compensates by using his LUE.   Additionally, the patient has recently resumed light household tasks such  as sweeping and mopping. However, he continues to have difficulty with activities that require grip strength, such as opening jars and water bottles, due to pain when attempting to make a fist.   FUNCTIONAL OUTCOME MEASURES: Quick Dash: 77.3% disability at eval 02/24/24: 20.5 %   UPPER EXTREMITY ROM:     Active ROM Right eval Left eval  Shoulder flexion    Shoulder abduction    Shoulder adduction    Shoulder extension    Shoulder internal rotation    Shoulder external rotation    Elbow flexion    Elbow extension    Wrist flexion Lacks full digital flexion when wrist is flexed WNL  Wrist extension Lacks full digital extension when wrist is extended WNL  Wrist ulnar deviation    Wrist radial deviation    Wrist pronation    Wrist supination    (Blank rows = not tested)  Active ROM Right eval Right 02/24/24 Left eval  Thumb MCP (0-60)     Thumb IP (0-80)     Thumb Radial abd/add (0-55)      Thumb Palmar abd/add (0-45)      Thumb Opposition to Small Finger      Index MCP (0-90)      Index PIP (0-100)      Index DIP (0-70)       Long MCP (0-90) 50*  60* 76*   Long PIP (0-100) 68 * 85* -13* ext  90*   Long DIP (0-70)  28* 58* 70*   Ring MCP (0-90)       Ring PIP (0-100)       Ring DIP (0-70)       Little MCP (0-90)       Little PIP (0-100)       Little DIP (0-70)        (Blank rows = not tested)  HAND FUNCTION:  One trial only at eval 01/28/24  Grip strength: Right: 14.5 lbs; Left: 99.4 lbs (LUE started at 77 with slow climb to 99 lbs) Pt experienced R hand pain when trying to grip the dynamometer.   02/24/24: Right: 42.3, 37.2, 31.7 lbs Left: 87.7, 82.8, 86.6 lbs Average: Right: 37.1 lbs; Left: 85.8 lbs  COORDINATION: 9 Hole Peg test: Right: 1 minute and 36 sec; Left: 36 sec Pt had difficulty with picking up pegs with R hand due to pain when having to flex middle finger; dropped 1 peg.   02/24/24 Right: Right: 29.80 sec Left 31.29  sec  SENSATION: Eval: Pt reports feeling numbness on R middle finger.  02/24/24: Volar surface of hand at scar sites remains achy/uncomfortable.  EDEMA: Yes, right middle finger down to the palm area.   COGNITION: Overall cognitive status: Within functional limits for tasks assessed  OBSERVATIONS:  The patient presents with significant swelling and some stiffness in the right middle finger, which may be contributing to his pain and limiting the functional use of his RUE.   TODAY'S TREATMENT:  OTR added a small piece of padding to the back of the finger strap to allow  pt to fasten the strap tighter to get more digital extension of the PIP joint.  - Manual therapy and Therapeutic Exercises completed for duration as noted below including:  Treatment emphasized manual scar massage throughout the digits--particularly the index/middle finger webspace--to decrease adhesions, improve tissue pliability, and support improved A/AROM with reduced pain. Notable continued improvement in scar softness, texture, and mobility was observed.  OTR performed IASTM using tissue movers at and around the incision sites to mobilize skin, fascia, neural tissue, and underlying musculotendinous structures. Intervention targeted reduction of scar adhesions and promotion of local circulation to support functional movement. Improved tissue texture with fewer palpable adhesions was noted upon completion.  Following manual work, patient engaged in guided finger flexion and extension, progressing from therapist-directed mobility to [facility] to address joint stiffness, enhance tissue extensibility, and reduce discomfort. Activities focused on restoring composite digital motion with the long-term goal of enabling functional grip.  Session concluded with active gripping tasks to reinforce flexor tendon gliding and functional hand use. Patient demonstrated improved tolerance and smoother movement patterns, though still limited by  residual stiffness and discomfort.  PATIENT EDUCATION: Education details: ROM, Person educated: Patient Education method: Explanation, Demonstration, Tactile cues, and Verbal cues Education comprehension: verbalized understanding, returned demonstration, verbal cues required, tactile cues required, and needs further education  HOME EXERCISE PROGRAM: 01/28/24:  Tendon glides, scar massage, wrist/digit exercises- Access code; [email or web address] 02/03/24: Retrograde massage, sensory precautions 02/06/24: Joint protection handout, putty exercises- access code;[email or web address] 02/11/24: Coordination activities  02/17/24: Splint wear (hand-based finger full extension orthosis)   GOALS: Goals reviewed with patient? Yes  SHORT TERM GOALS: Target date: 02/28/2024  Patient will demonstrate initial R UE HEP with 25% verbal cues or less for proper execution.  Baseline: Received tendon glides, scar massage handout, and wrist/digit exercises at eval Goal status: MET  2.  The patient will verbalize understanding of edema management strategies and demonstrate a noticeable decrease in edema in the RUE.  Baseline: Significant swelling noted in R hand (middle finger), issued compressive finger sleeve at eval Goal status: MET  3.  Pt will independently recall at least 3 total joint protection, ergonomic, and body mechanic principles.   Baseline: Pt currently use R thumb to hold objects such as grocery bags.  Goal status: MET  4. Pt will independently recall the 5 main sensory precautions (cold, heat, sharp, chemical, and heavy) as needed to prevent injury/harm secondary to impairments.   Baseline: Pt lacks sensation in R middle finger- received handout 02/03/24 Goal status: MET  5. Patient will verbalize two effective pain management techniques to use at home to improve functional use of RUE.  Baseline: Pt currently refrains from using his R hand due to the amount of pain when having to move the middle finger.  Goal  status: MET Take break/sleep; no meds; warm water; elevation, compression   LONG TERM GOALS: Target date: 03/27/2024   1. Patient will demonstrate updated R UE HEP with visual handouts only for proper execution.              Baseline: New to outpt OT             Goal status: IN PROGRESS  2.  Patient will demonstrate at least 50 lbs RUE grip strength as needed to open jars and other containers.  Baseline: 14.5 lbs (R hand) ; 99.4 lbs (L hand)  Goal status: IN PROGRESS Average: Right: 37.1 lbs; Left: 85.8 lbs  3.  Patient will demo improved FM coordination as  evidenced by completing nine-hole peg with use of RUE in 30 seconds or less.  Baseline: Right: 1 minute and 36 sec; Left: 36 sec Goal status: MET  4.The patient will increase AROM in digital flexion of middle finger (PIP,DIP, MCP) joints by 20 degrees in order to achieve full composite digital flexion, improving functional use of the hand during daily activities.             Baseline: RUE: MCP 50*, PIP 68*, DIP 28* versus LUE: MCP 76*, PIP 90*, PIP 70* Goal status: IN PROGRESS RUE MCP 60* PIP 85* DIP 58*  5. Patient will demonstrate at least 30% improvement with quick Dash score (reporting <50% disability or less) indicating improved functional use of affected extremity.  Baseline: 77.3% disability  Goal status: MET 02/24/24: 20.5 %   ASSESSMENT:  CLINICAL IMPRESSION: Patient is a 48 y.o. male who was seen today for occupational therapy treatment for flexor tenosynovitis of finger.  Pt's edema was much improved from last week and with intensive manual therapy today pt was able to touch fingertip pads to palm of his hand. He will continue to benefit from skilled outpatient OT to decrease pain and swelling of RUE for improved functional use for ADLs and IADLs.   PERFORMANCE DEFICITS: in functional skills including ADLs, IADLs, coordination, dexterity, sensation, edema, ROM, strength, pain, Fine motor control, Gross motor control,  endurance, decreased knowledge of precautions, decreased knowledge of use of DME, wound, skin integrity, and UE functional use.   IMPAIRMENTS: are limiting patient from ADLs, IADLs, rest and sleep, work, leisure, and social participation.   COMORBIDITIES: may have co-morbidities  that affects occupational performance. Patient will benefit from skilled OT to address above impairments and improve overall function.  PLAN:  OT FREQUENCY: 1-2x/week  OT DURATION: 8 weeks  PLANNED INTERVENTIONS: 97168 OT Re-evaluation, 97535 self care/ADL training, 02889 therapeutic exercise, 97530 therapeutic activity, 97140 manual therapy, 97035 ultrasound, 97018 paraffin, 02960 fluidotherapy, 97010 moist heat, 97010 cryotherapy, 97034 contrast bath, 97032 electrical stimulation (manual), 97750 Physical Performance Testing, 02239 Orthotic Initial, [id number] Orthotic/Prosthetic subsequent, manual lymph drainage, scar mobilization, passive range of motion, functional mobility training, compression bandaging, coping strategies training, patient/family education, and DME and/or AE instructions  RECOMMENDED OTHER SERVICES: None at the time of eval  CONSULTED AND AGREED WITH PLAN OF CARE: Patient  PLAN FOR NEXT SESSION:  Fluido/US  & manual therapy- for edema management  IASTM/ scar massage Review and practice more of the activities on the Coordination handout for continued promotion of digit mobility  Clarita LITTIE Pride, OT 02/25/2024, 4:59 PM

## 2024-03-01 NOTE — Progress Notes (Unsigned)
   Rodney Powell - 48 y.o. male MRN [id number]  Date of birth: [birth year]  Office Visit Note: Visit Date: 03/02/2024 PCP: Theotis Haze ORN, NP Referred by: Theotis Haze ORN, NP  Subjective:  HPI: Rodney Powell is a 48 y.o. male who presents today for follow up 10 weeks status post right long finger drainage of tendon sheath for flexor tenosynovitis. Right long finger PIP joint arthrotomy for infection with exploration and drainage, interphalangeal joint. Incision and drainage of abscess dorsal hand.  Pertinent ROS were reviewed with the patient and found to be negative unless otherwise specified above in HPI.   Assessment & Plan: Visit Diagnoses: No diagnosis found.  Plan: ***  Follow-up: No follow-ups on file.   Meds & Orders: No orders of the defined types were placed in this encounter.  No orders of the defined types were placed in this encounter.    Procedures: No procedures performed       Objective:   Vital Signs: There were no vitals taken for this visit.  Ortho Exam ***  Imaging: No results found.   Rodney Powell Rodney Powell, M.D. [facility], Hand Surgery

## 2024-03-02 ENCOUNTER — Ambulatory Visit: Payer: Self-pay | Admitting: Orthopedic Surgery

## 2024-03-02 ENCOUNTER — Ambulatory Visit: Payer: Self-pay | Admitting: Occupational Therapy

## 2024-03-02 DIAGNOSIS — M25641 Stiffness of right hand, not elsewhere classified: Secondary | ICD-10-CM

## 2024-03-02 DIAGNOSIS — L905 Scar conditions and fibrosis of skin: Secondary | ICD-10-CM

## 2024-03-02 DIAGNOSIS — R600 Localized edema: Secondary | ICD-10-CM

## 2024-03-02 DIAGNOSIS — R29898 Other symptoms and signs involving the musculoskeletal system: Secondary | ICD-10-CM

## 2024-03-02 DIAGNOSIS — R278 Other lack of coordination: Secondary | ICD-10-CM

## 2024-03-02 DIAGNOSIS — M6281 Muscle weakness (generalized): Secondary | ICD-10-CM

## 2024-03-02 DIAGNOSIS — M65949 Unspecified synovitis and tenosynovitis, unspecified hand: Secondary | ICD-10-CM

## 2024-03-02 NOTE — Therapy (Signed)
 OUTPATIENT OCCUPATIONAL THERAPY ORTHO TREATMENT  Patient Name: Rodney Powell MRN: [id number] DOB:1975/10/09, 48 y.o., male Today's Date: 03/02/2024  PCP: Theotis Haze ORN, NP REFERRING PROVIDER: Arlinda Buster, MD  END OF SESSION:  OT End of Session - 03/02/24 1314     Visit Number 9    Number of Visits 16    Date for Recertification  03/27/24    Authorization Type Self- pay    OT Start Time 1315    OT Stop Time 1400    OT Time Calculation (min) 45 min    Equipment Utilized During Treatment Fluido, IASTM    Activity Tolerance Patient tolerated treatment well    Behavior During Therapy WFL for tasks assessed/performed          Past Medical History:  Diagnosis Date   Back pain    Diabetes (HCC)    Hypertension    Past Surgical History:  Procedure Laterality Date   INCISION AND DRAINAGE OF WOUND Right 12/21/2023   Procedure: IRRIGATION AND DEBRIDEMENT WOUND;  Surgeon: Arlinda Buster, MD;  Location: [facility];  Service: Orthopedics;  Laterality: Right;  RIGHT LONG FINGER WOUND   INCISION AND DRAINAGE PERIRECTAL ABSCESS Right 01/04/2016   Procedure: IRRIGATION AND DEBRIDEMENT RIGHT BUTTOCKS  ABSCESS;  Surgeon: Krystal Spinner, MD;  Location: [facility];  Service: General;  Laterality: Right;   Patient Active Problem List   Diagnosis Date Noted   Flexor tenosynovitis of finger 12/21/2023   Abscess of finger of right hand 12/21/2023   Abscess of bursa of right hand 12/21/2023   Cellulitis 12/21/2023   Tenosynovitis of right hand 12/21/2023   Dyslipidemia 05/28/2023   Type 2 diabetes mellitus with hyperglycemia (HCC) 05/28/2023   Lumbar radiculopathy, chronic 09/25/2021   Abscess of right buttock 01/03/2016   Elevated blood sugar 01/03/2016   History of chest pain 01/03/2016    ONSET DATE:01/24/2024  REFERRING DIAG:  Diagnosis  M65.949 (ICD-10-CM) - Flexor tenosynovitis of finger  R60.0 (ICD-10-CM) - Hand edema    THERAPY DIAG:  Stiffness of right hand, not elsewhere  classified  Scar condition and fibrosis of skin  Muscle weakness (generalized)  Other lack of coordination  Localized edema  Other symptoms and signs involving the musculoskeletal system  Rationale for Evaluation and Treatment: Rehabilitation  SUBJECTIVE:   SUBJECTIVE STATEMENT: The patient reports his nighttime finger extension splint is fitting well.  He some pain/discomfort at 5/10 upon arrival and then 3/10 after fluido and moving it around. Pt reported he had a busy weekend with his grandchildren.       Pt accompanied by: self  PERTINENT HISTORY: Elevated BP, type 2 diabetes mellitus with hyperglycemia, lumbar radiculopathy, flexor tenosynovitis of finger, abscess bursa of R hand, hx of chest pain, cellulitis, and dyslipidemia  PRECAUTIONS: Back ( lumbar radiculopathy chronic)  RED FLAGS: None   WEIGHT BEARING RESTRICTIONS: No  PAIN:  Are you having pain? Yes NPRS scale: 3-5/10  Pain description: Achy and tight when moving it  Aggravating factors: Moving it  FALLS: Has patient fallen in last 6 months? No  LIVING ENVIRONMENT: Lives with: lives alone Lives in: House/apartment Stairs: External (porch stairs)  Has following equipment at home: None  PLOF: Independent; The patient works at [employer] but is currently on leave due to the injury. He is still able to drive and complete his daily activities, although it takes him more time and he often compensates by using his left hand. He is currently also experiencing difficulty with writing.  PATIENT GOALS:  The patient's goal is to regain mobility and strength in his hand so he can return to work.  NEXT MD VISIT: 03/02/2024  OBJECTIVE:  Note: Objective measures were completed at Evaluation unless otherwise noted.  HAND DOMINANCE: Right  ADLs: Overall ADLs: Overall, the patient is independent with all ADLs. However, because the affected hand is the patient's dominant hand and he is experiencing pain and swelling,  it takes him longer to complete tasks than usual. He often compensates by using his LUE.   Additionally, the patient has recently resumed light household tasks such as sweeping and mopping. However, he continues to have difficulty with activities that require grip strength, such as opening jars and water bottles, due to pain when attempting to make a fist.   FUNCTIONAL OUTCOME MEASURES: Quick Dash: 77.3% disability at eval 02/24/24: 20.5 %   UPPER EXTREMITY ROM:     Active ROM Right eval Left eval  Shoulder flexion    Shoulder abduction    Shoulder adduction    Shoulder extension    Shoulder internal rotation    Shoulder external rotation    Elbow flexion    Elbow extension    Wrist flexion Lacks full digital flexion when wrist is flexed WNL  Wrist extension Lacks full digital extension when wrist is extended WNL  Wrist ulnar deviation    Wrist radial deviation    Wrist pronation    Wrist supination    (Blank rows = not tested)  Active ROM Right eval Right 02/24/24 Left eval  Thumb MCP (0-60)     Thumb IP (0-80)     Thumb Radial abd/add (0-55)      Thumb Palmar abd/add (0-45)      Thumb Opposition to Small Finger      Index MCP (0-90)      Index PIP (0-100)      Index DIP (0-70)       Long MCP (0-90) 50*  60* 76*   Long PIP (0-100) 68 * 85* -13* ext  90*   Long DIP (0-70)  28* 58* 70*   Ring MCP (0-90)       Ring PIP (0-100)       Ring DIP (0-70)       Little MCP (0-90)       Little PIP (0-100)       Little DIP (0-70)        (Blank rows = not tested)  HAND FUNCTION:  One trial only at eval 01/28/24  Grip strength: Right: 14.5 lbs; Left: 99.4 lbs (LUE started at 77 with slow climb to 99 lbs) Pt experienced R hand pain when trying to grip the dynamometer.   02/24/24: Right: 42.3, 37.2, 31.7 lbs Left: 87.7, 82.8, 86.6 lbs Average: Right: 37.1 lbs; Left: 85.8 lbs  03/02/24:  Right: 40.1, 30.2, 25.3 86.6  COORDINATION: 9 Hole Peg test: Right: 1 minute  and 36 sec; Left: 36 sec Pt had difficulty with picking up pegs with R hand due to pain when having to flex middle finger; dropped 1 peg.   02/24/24 Right: Right: 29.80 sec Left 31.29 sec  SENSATION: Eval: Pt reports feeling numbness on R middle finger.  02/24/24: Volar surface of hand at scar sites remains achy/uncomfortable.  EDEMA: Yes, right middle finger down to the palm area.   COGNITION: Overall cognitive status: Within functional limits for tasks assessed  OBSERVATIONS:  The patient presents with significant swelling and some stiffness in the right middle finger, which may  be contributing to his pain and limiting the functional use of his RUE.   TODAY'S TREATMENT:  - Manual therapy and Therapeutic Exercises completed for duration as noted below including:  Pt placed RUE in Fluidotherapy machine with supervised AROM x 10 min. Pt was educated to complete AROM during modality time to improve ROM and decrease pain/stiffness of affected extremity by use of the machine's massaging action and thermal properties.   Treatment then emphasized manual scar massage throughout the digits--particularly the index/middle finger webspace--to decrease adhesions, improve tissue pliability, and support improved A/AROM with reduced pain. Pt continues to have good improvement in scar softness, texture, and mobility upon completion of mobilizations.  OTR performed IASTM using tissue movers at and around the incision sites to mobilize skin, fascia, neural tissue, and underlying musculotendinous structures. Intervention targeted reduction of scar adhesions followed by AAROM finger flexion.   Following manual work, patient engaged in guided finger flexion and extension, progressing from therapist-directed mobility to [facility] to address joint stiffness, enhance tissue extensibility, and reduce discomfort. Activities focused on restoring composite digital motion with the long-term goal of enabling functional  grip.  PATIENT EDUCATION: Education details: Edema control with option to try K-tape at next appt Person educated: Patient Education method: Explanation, Demonstration, Tactile cues, and Verbal cues Education comprehension: verbalized understanding, returned demonstration, verbal cues required, tactile cues required, and needs further education  HOME EXERCISE PROGRAM: 01/28/24:  Tendon glides, scar massage, wrist/digit exercises- Access code; [email or web address] 02/03/24: Retrograde massage, sensory precautions 02/06/24: Joint protection handout, putty exercises- access code;[email or web address] 02/11/24: Coordination activities  02/17/24: Splint wear (hand-based finger full extension orthosis)   GOALS: Goals reviewed with patient? Yes  SHORT TERM GOALS: Target date: 02/28/2024  Patient will demonstrate initial R UE HEP with 25% verbal cues or less for proper execution.  Baseline: Received tendon glides, scar massage handout, and wrist/digit exercises at eval Goal status: MET  2.  The patient will verbalize understanding of edema management strategies and demonstrate a noticeable decrease in edema in the RUE.  Baseline: Significant swelling noted in R hand (middle finger), issued compressive finger sleeve at eval Goal status: MET  3.  Pt will independently recall at least 3 total joint protection, ergonomic, and body mechanic principles.   Baseline: Pt currently use R thumb to hold objects such as grocery bags.  Goal status: MET  4. Pt will independently recall the 5 main sensory precautions (cold, heat, sharp, chemical, and heavy) as needed to prevent injury/harm secondary to impairments.   Baseline: Pt lacks sensation in R middle finger- received handout 02/03/24 Goal status: MET  5. Patient will verbalize two effective pain management techniques to use at home to improve functional use of RUE.  Baseline: Pt currently refrains from using his R hand due to the amount of pain when having to move the  middle finger.  Goal status: MET Take break/sleep; no meds; warm water; elevation, compression   LONG TERM GOALS: Target date: 03/27/2024   1. Patient will demonstrate updated R UE HEP with visual handouts only for proper execution.              Baseline: New to outpt OT             Goal status: IN PROGRESS  2.  Patient will demonstrate at least 50 lbs RUE grip strength as needed to open jars and other containers.  Baseline: 14.5 lbs (R hand) ; 99.4 lbs (L hand)  Goal status: IN PROGRESS Average: Right: 37.1 lbs;  Left: 85.8 lbs  3.  Patient will demo improved FM coordination as evidenced by completing nine-hole peg with use of RUE in 30 seconds or less.  Baseline: Right: 1 minute and 36 sec; Left: 36 sec Goal status: MET  4.The patient will increase AROM in digital flexion of middle finger (PIP,DIP, MCP) joints by 20 degrees in order to achieve full composite digital flexion, improving functional use of the hand during daily activities.             Baseline: RUE: MCP 50*, PIP 68*, DIP 28* versus LUE: MCP 76*, PIP 90*, PIP 70* Goal status: IN PROGRESS RUE MCP 60* PIP 85* DIP 58*  5. Patient will demonstrate at least 30% improvement with quick Dash score (reporting <50% disability or less) indicating improved functional use of affected extremity.  Baseline: 77.3% disability  Goal status: MET 02/24/24: 20.5 %   ASSESSMENT:  CLINICAL IMPRESSION: Patient is a 48 y.o. male who was seen today for occupational therapy treatment for flexor tenosynovitis of finger.  Pt's edema continues to improve with intensive manual therapy today improving scar mobility and then improving pt's ability to touch fingertip pads to palm of his hand, even with some DIP flexion today. He will continue to benefit from skilled outpatient OT to decrease pain and swelling of RUE for improved functional use for ADLs and IADLs.   PERFORMANCE DEFICITS: in functional skills including ADLs, IADLs, coordination,  dexterity, sensation, edema, ROM, strength, pain, Fine motor control, Gross motor control, endurance, decreased knowledge of precautions, decreased knowledge of use of DME, wound, skin integrity, and UE functional use.   IMPAIRMENTS: are limiting patient from ADLs, IADLs, rest and sleep, work, leisure, and social participation.   COMORBIDITIES: may have co-morbidities  that affects occupational performance. Patient will benefit from skilled OT to address above impairments and improve overall function.  PLAN:  OT FREQUENCY: 1-2x/week  OT DURATION: 8 weeks  PLANNED INTERVENTIONS: 97168 OT Re-evaluation, 97535 self care/ADL training, 02889 therapeutic exercise, 97530 therapeutic activity, 97140 manual therapy, 97035 ultrasound, 97018 paraffin, 02960 fluidotherapy, 97010 moist heat, 97010 cryotherapy, 97034 contrast bath, 97032 electrical stimulation (manual), 97750 Physical Performance Testing, 02239 Orthotic Initial, [id number] Orthotic/Prosthetic subsequent, manual lymph drainage, scar mobilization, passive range of motion, functional mobility training, compression bandaging, coping strategies training, patient/family education, and DME and/or AE instructions  RECOMMENDED OTHER SERVICES: None at the time of eval  CONSULTED AND AGREED WITH PLAN OF CARE: Patient  PLAN FOR NEXT SESSION:  Fluido/US  & manual therapy- for edema management  IASTM/ scar massage Review and practice more of the activities on the Coordination handout for continued promotion of digit mobility  Clarita LITTIE Pride, OT 03/02/2024, 4:46 PM

## 2024-03-03 ENCOUNTER — Ambulatory Visit: Payer: Self-pay | Admitting: Occupational Therapy

## 2024-03-03 DIAGNOSIS — M6281 Muscle weakness (generalized): Secondary | ICD-10-CM

## 2024-03-03 DIAGNOSIS — R600 Localized edema: Secondary | ICD-10-CM

## 2024-03-03 DIAGNOSIS — M25641 Stiffness of right hand, not elsewhere classified: Secondary | ICD-10-CM

## 2024-03-03 DIAGNOSIS — R29898 Other symptoms and signs involving the musculoskeletal system: Secondary | ICD-10-CM

## 2024-03-03 DIAGNOSIS — R278 Other lack of coordination: Secondary | ICD-10-CM

## 2024-03-03 DIAGNOSIS — L905 Scar conditions and fibrosis of skin: Secondary | ICD-10-CM

## 2024-03-03 NOTE — Therapy (Signed)
 OUTPATIENT OCCUPATIONAL THERAPY ORTHO TREATMENT  Patient Name: Rodney Powell MRN: [id number] DOB:08-Jan-1976, 48 y.o., male Today's Date: 03/03/2024  PCP: Theotis Haze ORN, NP REFERRING PROVIDER: Arlinda Buster, MD  END OF SESSION:  OT End of Session - 03/03/24 1114     Visit Number 10    Number of Visits 16    Date for Recertification  03/27/24    Authorization Type Self- pay    OT Start Time 1114    OT Stop Time 1155    OT Time Calculation (min) 41 min    Equipment Utilized During Treatment IASTM & Ktape    Activity Tolerance Patient tolerated treatment well    Behavior During Therapy WFL for tasks assessed/performed          Past Medical History:  Diagnosis Date   Back pain    Diabetes (HCC)    Hypertension    Past Surgical History:  Procedure Laterality Date   INCISION AND DRAINAGE OF WOUND Right 12/21/2023   Procedure: IRRIGATION AND DEBRIDEMENT WOUND;  Surgeon: Arlinda Buster, MD;  Location: [facility] OR;  Service: Orthopedics;  Laterality: Right;  RIGHT LONG FINGER WOUND   INCISION AND DRAINAGE PERIRECTAL ABSCESS Right 01/04/2016   Procedure: IRRIGATION AND DEBRIDEMENT RIGHT BUTTOCKS  ABSCESS;  Surgeon: Krystal Spinner, MD;  Location: [facility];  Service: General;  Laterality: Right;   Patient Active Problem List   Diagnosis Date Noted   Flexor tenosynovitis of finger 12/21/2023   Abscess of finger of right hand 12/21/2023   Abscess of bursa of right hand 12/21/2023   Cellulitis 12/21/2023   Tenosynovitis of right hand 12/21/2023   Dyslipidemia 05/28/2023   Type 2 diabetes mellitus with hyperglycemia (HCC) 05/28/2023   Lumbar radiculopathy, chronic 09/25/2021   Abscess of right buttock 01/03/2016   Elevated blood sugar 01/03/2016   History of chest pain 01/03/2016    ONSET DATE:01/24/2024  REFERRING DIAG:  Diagnosis  M65.949 (ICD-10-CM) - Flexor tenosynovitis of finger  R60.0 (ICD-10-CM) - Hand edema    THERAPY DIAG:  Stiffness of right hand, not elsewhere  classified  Scar condition and fibrosis of skin  Muscle weakness (generalized)  Other lack of coordination  Localized edema  Other symptoms and signs involving the musculoskeletal system  Rationale for Evaluation and Treatment: Rehabilitation  SUBJECTIVE:   SUBJECTIVE STATEMENT: Pt reported discomfort ~ 2-3/10.  Pt saw MD yesterday.   Pt seen by MD yesterday: for follow up 10 weeks status post Right long finger drainage of tendon sheath for flexor tenosynovitis, Right long finger PIP joint arthrotomy for infection with exploration and drainage, interphalangeal joint, and Incision and drainage of abscess dorsal hand.  Doing very well overall, has been doing OT as instructed with nice progress in both range of motion and strengthening.    Plan: He has done very well postoperatively.  He is very pleased with his progress.  At this juncture he would like to return to work without restriction which is appropriate.  Work note was provided today to resume next week and full duty without restriction.  He is welcome to return to me as needed moving forward.  He can continue with OT as instructed with transition to home exercise program when appropriate.       Pt accompanied by: self  PERTINENT HISTORY: Elevated BP, type 2 diabetes mellitus with hyperglycemia, lumbar radiculopathy, flexor tenosynovitis of finger, abscess bursa of R hand, hx of chest pain, cellulitis, and dyslipidemia  PRECAUTIONS: Back ( lumbar radiculopathy chronic)  RED FLAGS:  None   WEIGHT BEARING RESTRICTIONS: No  PAIN:  Are you having pain? Yes NPRS scale: 2 - 3 with movement /10  Pain description: Achy and tight when moving it  Aggravating factors: Moving it  FALLS: Has patient fallen in last 6 months? No  LIVING ENVIRONMENT: Lives with: lives alone Lives in: House/apartment Stairs: External (porch stairs)  Has following equipment at home: None  PLOF: Independent; The patient works at [employer] but is  currently on leave due to the injury. He is still able to drive and complete his daily activities, although it takes him more time and he often compensates by using his left hand. He is currently also experiencing difficulty with writing.  PATIENT GOALS: The patient's goal is to regain mobility and strength in his hand so he can return to work.  NEXT MD VISIT: NA  OBJECTIVE:  Note: Objective measures were completed at Evaluation unless otherwise noted.  HAND DOMINANCE: Right  ADLs: Overall ADLs: Overall, the patient is independent with all ADLs. However, because the affected hand is the patient's dominant hand and he is experiencing pain and swelling, it takes him longer to complete tasks than usual. He often compensates by using his LUE.   Additionally, the patient has recently resumed light household tasks such as sweeping and mopping. However, he continues to have difficulty with activities that require grip strength, such as opening jars and water bottles, due to pain when attempting to make a fist.   FUNCTIONAL OUTCOME MEASURES: Quick Dash: 77.3% disability at eval 02/24/24: 20.5 %   UPPER EXTREMITY ROM:     Active ROM Right eval Left eval  Shoulder flexion    Shoulder abduction    Shoulder adduction    Shoulder extension    Shoulder internal rotation    Shoulder external rotation    Elbow flexion    Elbow extension    Wrist flexion Lacks full digital flexion when wrist is flexed WNL  Wrist extension Lacks full digital extension when wrist is extended WNL  Wrist ulnar deviation    Wrist radial deviation    Wrist pronation    Wrist supination    (Blank rows = not tested)  Active ROM Right eval Right 02/24/24 Left eval  Thumb MCP (0-60)     Thumb IP (0-80)     Thumb Radial abd/add (0-55)      Thumb Palmar abd/add (0-45)      Thumb Opposition to Small Finger      Index MCP (0-90)      Index PIP (0-100)      Index DIP (0-70)       Long MCP (0-90) 50*  60* 76*    Long PIP (0-100) 68 * 85* -13* ext  90*   Long DIP (0-70)  28* 58* 70*   Ring MCP (0-90)       Ring PIP (0-100)       Ring DIP (0-70)       Little MCP (0-90)       Little PIP (0-100)       Little DIP (0-70)        (Blank rows = not tested)  HAND FUNCTION:  One trial only at eval 01/28/24  Grip strength: Right: 14.5 lbs; Left: 99.4 lbs (LUE started at 77 with slow climb to 99 lbs) Pt experienced R hand pain when trying to grip the dynamometer.   02/24/24: Right: 42.3, 37.2, 31.7 lbs Left: 87.7, 82.8, 86.6 lbs Average: Right: 37.1 lbs;  Left: 85.8 lbs  03/02/24:  Right: 40.1, 30.2, 25.3 86.6  COORDINATION: 9 Hole Peg test: Right: 1 minute and 36 sec; Left: 36 sec Pt had difficulty with picking up pegs with R hand due to pain when having to flex middle finger; dropped 1 peg.   02/24/24 Right: Right: 29.80 sec Left 31.29 sec  SENSATION: Eval: Pt reports feeling numbness on R middle finger.  02/24/24: Volar surface of hand at scar sites remains achy/uncomfortable.  EDEMA: Yes, right middle finger down to the palm area.   COGNITION: Overall cognitive status: Within functional limits for tasks assessed  OBSERVATIONS:  The patient presents with significant swelling and some stiffness in the right middle finger, which may be contributing to his pain and limiting the functional use of his RUE.   TODAY'S TREATMENT:  - Manual therapy and Therapeutic Exercises completed for duration as noted below including:  Treatment continued to focus on manual scar massage particularly the index/middle finger webspace--to decrease adhesions, improve tissue pliability, and support improved A/AROM with reduced pain. Pt continues to have good improvement in scar softness, texture, and mobility upon completion of mobilizations.  OTR performed IASTM using tissue movers at and around the incision sites to mobilize skin, fascia, neural tissue, and underlying musculotendinous structures.  Intervention targeted reduction of scar adhesions followed by AAROM finger flexion.   Following manual work, patient engaged in guided finger flexion and extension, progressing from therapist-directed mobility to [facility] to address joint stiffness, enhance tissue extensibility, and reduce discomfort. Activities focused on restoring composite digital motion with the long-term goal of enabling functional grip with good success at achieving grip with fingernails nearly embedded in his palm.  Further TE completed with various kettle bells and milk carton to simulate work tasks of removing objects from shelf per work requirements.  Pt able to handle 5-25 lbs well with shorter tolerance to 30 lb kettle bell.   - Self Care education and training completed for duration as noted below including:  Applied Kinesiotape over dorsal aspect of R middle finger using slight stretch with the aim of reducing edema and providing scar mobilizing. Prior to application, patient denied any history of skin irritation or allergy to adhesive. Educated patient on purpose of kinesiotape placement and signs symptoms of allergic reaction or irritation. Cleaned patient's skin. Instructed patient that the tape can be left on up to 3+ days and can be worn in the shower. Instructed to removed the tape if peeling off, signs of skin irritation arise, or it has been on for >3 days. Informed patient that the tape is best removed in the shower or with a wet wash cloth. Patient stated understanding.   PATIENT EDUCATION: Education details: Edema control with option to try K-tape at next appt Person educated: Patient Education method: Explanation, Demonstration, Tactile cues, and Verbal cues Education comprehension: verbalized understanding, returned demonstration, verbal cues required, tactile cues required, and needs further education  HOME EXERCISE PROGRAM: 01/28/24:  Tendon glides, scar massage, wrist/digit exercises- Access code;  [email or web address] 02/03/24: Retrograde massage, sensory precautions 02/06/24: Joint protection handout, putty exercises- access code;[email or web address] 02/11/24: Coordination activities  02/17/24: Splint wear (hand-based finger full extension orthosis)   GOALS: Goals reviewed with patient? Yes  SHORT TERM GOALS: Target date: 02/28/2024  Patient will demonstrate initial R UE HEP with 25% verbal cues or less for proper execution.  Baseline: Received tendon glides, scar massage handout, and wrist/digit exercises at eval Goal status: MET  2.  The patient will verbalize understanding of edema  management strategies and demonstrate a noticeable decrease in edema in the RUE.  Baseline: Significant swelling noted in R hand (middle finger), issued compressive finger sleeve at eval Goal status: MET  3.  Pt will independently recall at least 3 total joint protection, ergonomic, and body mechanic principles.   Baseline: Pt currently use R thumb to hold objects such as grocery bags.  Goal status: MET  4. Pt will independently recall the 5 main sensory precautions (cold, heat, sharp, chemical, and heavy) as needed to prevent injury/harm secondary to impairments.   Baseline: Pt lacks sensation in R middle finger- received handout 02/03/24 Goal status: MET  5. Patient will verbalize two effective pain management techniques to use at home to improve functional use of RUE.  Baseline: Pt currently refrains from using his R hand due to the amount of pain when having to move the middle finger.  Goal status: MET Take break/sleep; no meds; warm water; elevation, compression   LONG TERM GOALS: Target date: 03/27/2024   1. Patient will demonstrate updated R UE HEP with visual handouts only for proper execution.              Baseline: New to outpt OT             Goal status: IN PROGRESS  2.  Patient will demonstrate at least 50 lbs RUE grip strength as needed to open jars and other containers.  Baseline: 14.5 lbs (R hand)  ; 99.4 lbs (L hand)  Goal status: IN PROGRESS Average: Right: 37.1 lbs; Left: 85.8 lbs  3.  Patient will demo improved FM coordination as evidenced by completing nine-hole peg with use of RUE in 30 seconds or less.  Baseline: Right: 1 minute and 36 sec; Left: 36 sec Goal status: MET  4.The patient will increase AROM in digital flexion of middle finger (PIP,DIP, MCP) joints by 20 degrees in order to achieve full composite digital flexion, improving functional use of the hand during daily activities.             Baseline: RUE: MCP 50*, PIP 68*, DIP 28* versus LUE: MCP 76*, PIP 90*, PIP 70* Goal status: IN PROGRESS RUE MCP 60* PIP 85* DIP 58*  5. Patient will demonstrate at least 30% improvement with quick Dash score (reporting <50% disability or less) indicating improved functional use of affected extremity.  Baseline: 77.3% disability  Goal status: MET 02/24/24: 20.5 %   ASSESSMENT:  CLINICAL IMPRESSION: Patient is a 47 y.o. male who was seen today for occupational therapy treatment for flexor tenosynovitis of finger.  Pt's edema continues to improve with intensive manual therapy today improving scar mobility and then improving pt's ability to touch fingertip pads to palm of his hand, even with some DIP flexion today. He did well with simulate carrying and lifting activities in anticipation of resuming work next week.  Pt will continue to benefit from skilled outpatient OT to decrease pain and swelling of RUE for improved functional use for ADLs and IADLs.   PERFORMANCE DEFICITS: in functional skills including ADLs, IADLs, coordination, dexterity, sensation, edema, ROM, strength, pain, Fine motor control, Gross motor control, endurance, decreased knowledge of precautions, decreased knowledge of use of DME, wound, skin integrity, and UE functional use.   IMPAIRMENTS: are limiting patient from ADLs, IADLs, rest and sleep, work, leisure, and social participation.   COMORBIDITIES: may have  co-morbidities  that affects occupational performance. Patient will benefit from skilled OT to address above impairments and improve overall function.  PLAN:  OT FREQUENCY: 1-2x/week  OT DURATION: 8 weeks  PLANNED INTERVENTIONS: 97168 OT Re-evaluation, 97535 self care/ADL training, 02889 therapeutic exercise, 97530 therapeutic activity, 97140 manual therapy, 97035 ultrasound, 97018 paraffin, 02960 fluidotherapy, 97010 moist heat, 97010 cryotherapy, 97034 contrast bath, 97032 electrical stimulation (manual), 97750 Physical Performance Testing, 02239 Orthotic Initial, [id number] Orthotic/Prosthetic subsequent, manual lymph drainage, scar mobilization, passive range of motion, functional mobility training, compression bandaging, coping strategies training, patient/family education, and DME and/or AE instructions  RECOMMENDED OTHER SERVICES: None at the time of eval  CONSULTED AND AGREED WITH PLAN OF CARE: Patient  PLAN FOR NEXT SESSION:  Fluido/US  & manual therapy- for edema management  IASTM/ scar massage Review and practice more of the activities on the Coordination handout for continued promotion of digit mobility  Clarita LITTIE Pride, OT 03/03/2024, 7:23 PM

## 2024-03-09 ENCOUNTER — Ambulatory Visit: Payer: Self-pay | Admitting: Occupational Therapy

## 2024-03-09 DIAGNOSIS — R278 Other lack of coordination: Secondary | ICD-10-CM | POA: Insufficient documentation

## 2024-03-09 DIAGNOSIS — L905 Scar conditions and fibrosis of skin: Secondary | ICD-10-CM | POA: Insufficient documentation

## 2024-03-09 DIAGNOSIS — R600 Localized edema: Secondary | ICD-10-CM | POA: Insufficient documentation

## 2024-03-09 DIAGNOSIS — M25641 Stiffness of right hand, not elsewhere classified: Secondary | ICD-10-CM | POA: Insufficient documentation

## 2024-03-09 DIAGNOSIS — R29898 Other symptoms and signs involving the musculoskeletal system: Secondary | ICD-10-CM | POA: Insufficient documentation

## 2024-03-09 DIAGNOSIS — M65949 Unspecified synovitis and tenosynovitis, unspecified hand: Secondary | ICD-10-CM | POA: Insufficient documentation

## 2024-03-09 DIAGNOSIS — M6281 Muscle weakness (generalized): Secondary | ICD-10-CM | POA: Insufficient documentation

## 2024-03-10 ENCOUNTER — Ambulatory Visit: Payer: Self-pay

## 2024-03-10 ENCOUNTER — Telehealth: Payer: Self-pay | Admitting: Occupational Therapy

## 2024-03-10 NOTE — Telephone Encounter (Signed)
 This is to document my attempt to call patient after no-show for OT appt yesterday.     Primary phone number(s) was used in efforts to contact the patient.   Voice mail was left requesting the patient call the clinic back at [phone] to confirm upcoming appointment.

## 2024-03-16 ENCOUNTER — Ambulatory Visit: Payer: Self-pay | Admitting: Occupational Therapy

## 2024-03-16 DIAGNOSIS — R29898 Other symptoms and signs involving the musculoskeletal system: Secondary | ICD-10-CM

## 2024-03-16 DIAGNOSIS — R278 Other lack of coordination: Secondary | ICD-10-CM

## 2024-03-16 DIAGNOSIS — L905 Scar conditions and fibrosis of skin: Secondary | ICD-10-CM

## 2024-03-16 DIAGNOSIS — R600 Localized edema: Secondary | ICD-10-CM

## 2024-03-16 DIAGNOSIS — M25641 Stiffness of right hand, not elsewhere classified: Secondary | ICD-10-CM

## 2024-03-16 DIAGNOSIS — M6281 Muscle weakness (generalized): Secondary | ICD-10-CM

## 2024-03-16 NOTE — Patient Instructions (Signed)
RIGHT HANDED TYPING WORDS pink oink pill hike hip omen play plan king noon plug jug mom pop mop him moon lime poultry fiction lint bundle typing nominal your boil coil soil numb pick lick humble jump hump bunk funk plump dump out tuition motion notion potion lotion jungle buy book hook nun none  

## 2024-03-16 NOTE — Addendum Note (Signed)
 Addended by: Iness Pangilinan L on: 03/16/2024 05:56 PM   Modules accepted: Orders

## 2024-03-16 NOTE — Therapy (Signed)
 OUTPATIENT OCCUPATIONAL THERAPY ORTHO TREATMENT  Patient Name: Rodney Powell MRN: [id number] DOB:[birth year], 48 y.o., male Today's Date: 03/16/2024  PCP: Theotis Haze ORN, NP REFERRING PROVIDER: Arlinda Buster, MD  END OF SESSION:  OT End of Session - 03/16/24 0939     Visit Number 11    Number of Visits 16    Date for Recertification  03/27/24    Authorization Type Self- pay    OT Start Time 0940    OT Stop Time 1040    OT Time Calculation (min) 60 min    Equipment Utilized During Treatment fluido, FM objects    Activity Tolerance Patient tolerated treatment well    Behavior During Therapy WFL for tasks assessed/performed          Past Medical History:  Diagnosis Date   Back pain    Diabetes (HCC)    Hypertension    Past Surgical History:  Procedure Laterality Date   INCISION AND DRAINAGE OF WOUND Right 12/21/2023   Procedure: IRRIGATION AND DEBRIDEMENT WOUND;  Surgeon: Arlinda Buster, MD;  Location: [facility];  Service: Orthopedics;  Laterality: Right;  RIGHT LONG FINGER WOUND   INCISION AND DRAINAGE PERIRECTAL ABSCESS Right 01/04/2016   Procedure: IRRIGATION AND DEBRIDEMENT RIGHT BUTTOCKS  ABSCESS;  Surgeon: Krystal Spinner, MD;  Location: [facility];  Service: General;  Laterality: Right;   Patient Active Problem List   Diagnosis Date Noted   Flexor tenosynovitis of finger 12/21/2023   Abscess of finger of right hand 12/21/2023   Abscess of bursa of right hand 12/21/2023   Cellulitis 12/21/2023   Tenosynovitis of right hand 12/21/2023   Dyslipidemia 05/28/2023   Type 2 diabetes mellitus with hyperglycemia (HCC) 05/28/2023   Lumbar radiculopathy, chronic 09/25/2021   Abscess of right buttock 01/03/2016   Elevated blood sugar 01/03/2016   History of chest pain 01/03/2016    ONSET DATE:01/24/2024  REFERRING DIAG:  Diagnosis  M65.949 (ICD-10-CM) - Flexor tenosynovitis of finger  R60.0 (ICD-10-CM) - Hand edema    THERAPY DIAG:  Stiffness of right hand, not  elsewhere classified  Scar condition and fibrosis of skin  Muscle weakness (generalized)  Other lack of coordination  Localized edema  Other symptoms and signs involving the musculoskeletal system  Rationale for Evaluation and Treatment: Rehabilitation  SUBJECTIVE:   SUBJECTIVE STATEMENT: Pt reports he had a busy holiday but no real pain - just cramping.    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   Pt accompanied by: self  PERTINENT HISTORY: Elevated BP, type 2 diabetes mellitus with hyperglycemia, lumbar radiculopathy, flexor tenosynovitis of finger, abscess bursa of R hand, hx of chest pain, cellulitis, and dyslipidemia  03/02/24:  Pt seen by MD: for follow up 10 weeks status post Right long finger drainage of tendon sheath for flexor tenosynovitis, Right long finger PIP joint arthrotomy for infection with exploration and drainage, interphalangeal joint, and Incision and drainage of abscess dorsal hand.  Doing very well overall, has been doing OT as instructed with nice progress in both range of motion and strengthening.    Plan: He has done very well postoperatively.  He is very pleased with his progress.  At this juncture he would like to return to work without restriction which is appropriate.  Work note was provided today to resume next week and  full duty without restriction.  He is welcome to return to me as needed moving forward.  He can continue with OT as instructed with transition to home exercise program when appropriate.      PRECAUTIONS: Back ( lumbar radiculopathy chronic)  RED FLAGS: None   WEIGHT BEARING RESTRICTIONS: No  PAIN:  Are you having pain? No Mostly just cramping but not really painful   FALLS: Has patient fallen in last 6 months? No  LIVING ENVIRONMENT: Lives with: lives  alone Lives in: House/apartment Stairs: External (porch stairs)  Has following equipment at home: None  PLOF: Independent; The patient works at [employer] but is currently on leave due to the injury. He is still able to drive and complete his daily activities, although it takes him more time and he often compensates by using his left hand. He is currently also experiencing difficulty with writing.  PATIENT GOALS: The patient's goal is to regain mobility and strength in his hand so he can return to work.  NEXT MD VISIT: NA  OBJECTIVE:  Note: Objective measures were completed at Evaluation unless otherwise noted.  HAND DOMINANCE: Right  ADLs: Overall ADLs: Overall, the patient is independent with all ADLs. However, because the affected hand is the patient's dominant hand and he is experiencing pain and swelling, it takes him longer to complete tasks than usual. He often compensates by using his LUE.   Additionally, the patient has recently resumed light household tasks such as sweeping and mopping. However, he continues to have difficulty with activities that require grip strength, such as opening jars and water bottles, due to pain when attempting to make a fist.   FUNCTIONAL OUTCOME MEASURES: Quick Dash: 77.3% disability at eval 02/24/24: 20.5 %   UPPER EXTREMITY ROM:     Active ROM Right eval Left eval  Shoulder flexion    Shoulder abduction    Shoulder adduction    Shoulder extension    Shoulder internal rotation    Shoulder external rotation    Elbow flexion    Elbow extension    Wrist flexion Lacks full digital flexion when wrist is flexed WNL  Wrist extension Lacks full digital extension when wrist is extended WNL  Wrist ulnar deviation    Wrist radial deviation    Wrist pronation    Wrist supination    (Blank rows = not tested)  Active ROM Right eval Right 02/24/24 Left eval  Thumb MCP (0-60)     Thumb IP (0-80)     Thumb Radial abd/add (0-55)      Thumb  Palmar abd/add (0-45)      Thumb Opposition to Small Finger      Index MCP (0-90)      Index PIP (0-100)      Index DIP (0-70)       Long MCP (0-90) 50*  60* 76*   Long PIP (0-100) 68 * 85* -13* ext  90*   Long DIP (0-70)  28* 58* 70*   Ring MCP (0-90)       Ring PIP (0-100)       Ring DIP (0-70)       Little MCP (0-90)       Little PIP (0-100)       Little DIP (0-70)        (Blank rows = not tested)  HAND FUNCTION:  One trial only at eval 01/28/24  Grip strength: Right: 14.5 lbs; Left: 99.4 lbs (LUE started at 77 with slow  climb to 99 lbs) Pt experienced R hand pain when trying to grip the dynamometer.   02/24/24: Right: 42.3, 37.2, 31.7 lbs Left: 87.7, 82.8, 86.6 lbs Average: Right: 37.1 lbs; Left: 85.8 lbs  03/02/24:  Right: 40.1, 30.2, 25.3 Left: 86.6 lbs   03/16/24 Right: 58.2, 53.1, 66.1 Avg 59.1 lbs Left: 86.6  COORDINATION: 9 Hole Peg test: Right: 1 minute and 36 sec; Left: 36 sec Pt had difficulty with picking up pegs with R hand due to pain when having to flex middle finger; dropped 1 peg.   02/24/24 Right: Right: 29.80 sec Left 31.29 sec  SENSATION: Eval: Pt reports feeling numbness on R middle finger.  02/24/24: Volar surface of hand at scar sites remains achy/uncomfortable.  EDEMA: Yes, right middle finger down to the palm area.   COGNITION: Overall cognitive status: Within functional limits for tasks assessed  OBSERVATIONS:  The patient presents with significant swelling and some stiffness in the right middle finger, which may be contributing to his pain and limiting the functional use of his RUE.   TODAY'S TREATMENT:  - Therapeutic Exercises completed for duration as noted below including:  Pt completed grip strength test with good improvement since 2 weeks ago.  He has gradual increase in strength over 3 trials today and nearly a 30 lb improvement since last visit.  Pt placed RUE in Fluidotherapy machine with supervised AROM x 10 min. Pt was  educated to complete AROM and dripping during modality time to improve ROM and decrease pain/stiffness of affected extremity by use of the machine's massaging action and thermal properties.   Pt engaged in resistance bar exercises again with red and beige flex bar x 10-20 reps each for strength and endurance of affected extremity with isolation of middle finger by removing index finger form task: -wrist flex and extension - twisting bar forward and backwards to flex and extend wrist/s -supination/pronation - bending the bar on table top side to side to rotate forearm - palm up and down -radial/ulnar deviation - pushing the bar back on forth on table top to tip wrist - from pinkie back to thumb  Pt able to perform all motions with min difficulty.   - Manual therapy completed for duration as noted below including:  Treatment continued to focus on manual scar massage particularly the index/middle finger webspace--to decrease adhesions, improve tissue pliability, and support improved A/AROM with reduced pain. Pt continues to have good improvement in scar softness, texture, and mobility upon completion of mobilizations.  OTR performed IASTM using tissue movers at and around the incision sites to mobilize skin, fascia, neural tissue, and underlying musculotendinous structures. Intervention targeted reduction of scar adhesions followed by AAROM finger flexion.   - Therapeutic activities completed for duration as noted below including:  Coordination Exercise/Activities to work on R UE finger ROM, dexterity and isolated movements with demonstration and practice, as well as modification, and cues throughout to improve technique, digital isolation and ease of performing task.  Tasks included:    Rotate golf balls (clockwise and counter-clockwise) with forearm pronated and balls on table or supinated and balls in hand. Grooved Pegs with RUE for increased coordination. Pt placed pegs with one at a time and  removed with in hand manipulation. Pt completed with min difficulty and 2 drops. Patient completed typing/fine motor coordination exercises using [email or web address] to promote rehabilitation of affected extremity.  Patient completed right handed typing words and noted to avoid use of middle finger often.  Patient encouraged to practice  list at home focusing on words with I and K to work on middle finger.   PATIENT EDUCATION: Education details: Edema control, scar massage and R finger isolation. Person educated: Patient Education method: Explanation, Demonstration, Tactile cues, and Verbal cues Education comprehension: verbalized understanding, returned demonstration, verbal cues required, tactile cues required, and needs further education  HOME EXERCISE PROGRAM: 01/28/24:  Tendon glides, scar massage, wrist/digit exercises- Access code; [email or web address] 02/03/24: Retrograde massage, sensory precautions 02/06/24: Joint protection handout, putty exercises- access code;[email or web address] 02/11/24: Coordination activities  02/17/24: Splint wear (hand-based finger full extension orthosis)   GOALS: Goals reviewed with patient? Yes  SHORT TERM GOALS: Target date: 02/28/2024  Patient will demonstrate initial R UE HEP with 25% verbal cues or less for proper execution.  Baseline: Received tendon glides, scar massage handout, and wrist/digit exercises at eval Goal status: MET  2.  The patient will verbalize understanding of edema management strategies and demonstrate a noticeable decrease in edema in the RUE.  Baseline: Significant swelling noted in R hand (middle finger), issued compressive finger sleeve at eval Goal status: MET  3.  Pt will independently recall at least 3 total joint protection, ergonomic, and body mechanic principles.   Baseline: Pt currently use R thumb to hold objects such as grocery bags.  Goal status: MET  4. Pt will independently recall the 5 main sensory precautions (cold, heat, sharp,  chemical, and heavy) as needed to prevent injury/harm secondary to impairments.   Baseline: Pt lacks sensation in R middle finger- received handout 02/03/24 Goal status: MET  5. Patient will verbalize two effective pain management techniques to use at home to improve functional use of RUE.  Baseline: Pt currently refrains from using his R hand due to the amount of pain when having to move the middle finger.  Goal status: MET Take break/sleep; no meds; warm water; elevation, compression   LONG TERM GOALS: Target date: 03/27/2024   1. Patient will demonstrate updated R UE HEP with visual handouts only for proper execution.              Baseline: New to outpt OT             Goal status: IN PROGRESS  2.  Patient will demonstrate at least 65  lbs RUE grip strength x 3 trials as needed to open jars and other containers.  Baseline: 14.5 lbs (R hand) ; 99.4 lbs (L hand)  Goal status: MET & revised 03/02/24 Average: Right: 37.1 lbs; Left: 85.8 lbs 03/16/24 Avg 59.1 lbs  3.  Patient will demo improved FM coordination as evidenced by completing nine-hole peg with use of RUE in 30 seconds or less.  Baseline: Right: 1 minute and 36 sec; Left: 36 sec Goal status: MET  4.The patient will increase AROM in digital flexion of middle finger (PIP,DIP, MCP) joints by 20 degrees in order to achieve full composite digital flexion, improving functional use of the hand during daily activities.             Baseline: RUE: MCP 50*, PIP 68*, DIP 28* versus LUE: MCP 76*, PIP 90*, PIP 70* Goal status: IN PROGRESS RUE MCP 60* PIP 85* DIP 58*  5. Patient will demonstrate at least 30% improvement with quick Dash score (reporting <50% disability or less) indicating improved functional use of affected extremity.  Baseline: 77.3% disability  Goal status: MET 02/24/24: 20.5 %   ASSESSMENT:  CLINICAL IMPRESSION: Patient is a 48 y.o. male who was seen today for occupational therapy  treatment for flexor tenosynovitis  of finger.  Pt's edema continues to improve with ongoing manual therapy today improving scar mobility.  Pt able to touch fingertip pads to palm of his hand, even with some DIP flexion today.  Continued education provided for PIP extension activities as well.  Pt will continue to benefit from skilled outpatient OT to decrease pain and swelling of RUE for improved functional use for ADLs and IADLs.   PERFORMANCE DEFICITS: in functional skills including ADLs, IADLs, coordination, dexterity, sensation, edema, ROM, strength, pain, Fine motor control, Gross motor control, endurance, decreased knowledge of precautions, decreased knowledge of use of DME, wound, skin integrity, and UE functional use.   IMPAIRMENTS: are limiting patient from ADLs, IADLs, rest and sleep, work, leisure, and social participation.   COMORBIDITIES: may have co-morbidities  that affects occupational performance. Patient will benefit from skilled OT to address above impairments and improve overall function.  PLAN:  OT FREQUENCY: 1-2x/week  OT DURATION: 8 weeks  PLANNED INTERVENTIONS: 97168 OT Re-evaluation, 97535 self care/ADL training, 02889 therapeutic exercise, 97530 therapeutic activity, 97140 manual therapy, 97035 ultrasound, 97018 paraffin, 02960 fluidotherapy, 97010 moist heat, 97010 cryotherapy, 97034 contrast bath, 97032 electrical stimulation (manual), 97750 Physical Performance Testing, 02239 Orthotic Initial, [id number] Orthotic/Prosthetic subsequent, manual lymph drainage, scar mobilization, passive range of motion, functional mobility training, compression bandaging, coping strategies training, patient/family education, and DME and/or AE instructions  RECOMMENDED OTHER SERVICES: None at the time of eval  CONSULTED AND AGREED WITH PLAN OF CARE: Patient  PLAN FOR NEXT SESSION:  Fluido/US  & manual therapy- for edema management  IASTM/ scar massage Review and practice more of the activities on the Coordination handout for  continued promotion of digit mobility  Clarita LITTIE Pride, OT 03/16/2024, 10:49 AM

## 2024-03-17 ENCOUNTER — Ambulatory Visit: Payer: Self-pay | Admitting: Occupational Therapy

## 2024-03-23 ENCOUNTER — Ambulatory Visit: Payer: Self-pay | Admitting: Occupational Therapy

## 2024-03-23 DIAGNOSIS — M6281 Muscle weakness (generalized): Secondary | ICD-10-CM

## 2024-03-23 DIAGNOSIS — M65949 Unspecified synovitis and tenosynovitis, unspecified hand: Secondary | ICD-10-CM

## 2024-03-23 DIAGNOSIS — R278 Other lack of coordination: Secondary | ICD-10-CM

## 2024-03-23 DIAGNOSIS — L905 Scar conditions and fibrosis of skin: Secondary | ICD-10-CM

## 2024-03-23 DIAGNOSIS — M25641 Stiffness of right hand, not elsewhere classified: Secondary | ICD-10-CM

## 2024-03-23 NOTE — Therapy (Signed)
 OUTPATIENT OCCUPATIONAL THERAPY ORTHO TREATMENT & DISCHARGE SUMMARY  Patient Name: Rodney Powell MRN: [id number] DOB:1975-12-07, 48 y.o., male Today's Date: 03/23/2024  PCP: Theotis Haze ORN, NP REFERRING PROVIDER: Arlinda Buster, MD  END OF SESSION:  OT End of Session - 03/23/24 1113     Visit Number 12    Number of Visits 16    Date for Recertification  03/27/24    Authorization Type Self- pay    OT Start Time 1113    OT Stop Time 1153    OT Time Calculation (min) 40 min    Equipment Utilized During Treatment Fluido, testing material    Activity Tolerance Patient tolerated treatment well    Behavior During Therapy WFL for tasks assessed/performed          Past Medical History:  Diagnosis Date   Back pain    Diabetes (HCC)    Hypertension    Past Surgical History:  Procedure Laterality Date   INCISION AND DRAINAGE OF WOUND Right 12/21/2023   Procedure: IRRIGATION AND DEBRIDEMENT WOUND;  Surgeon: Arlinda Buster, MD;  Location: [facility];  Service: Orthopedics;  Laterality: Right;  RIGHT LONG FINGER WOUND   INCISION AND DRAINAGE PERIRECTAL ABSCESS Right 01/04/2016   Procedure: IRRIGATION AND DEBRIDEMENT RIGHT BUTTOCKS  ABSCESS;  Surgeon: Krystal Spinner, MD;  Location: [facility];  Service: General;  Laterality: Right;   Patient Active Problem List   Diagnosis Date Noted   Flexor tenosynovitis of finger 12/21/2023   Abscess of finger of right hand 12/21/2023   Abscess of bursa of right hand 12/21/2023   Cellulitis 12/21/2023   Tenosynovitis of right hand 12/21/2023   Dyslipidemia 05/28/2023   Type 2 diabetes mellitus with hyperglycemia (HCC) 05/28/2023   Lumbar radiculopathy, chronic 09/25/2021   Abscess of right buttock 01/03/2016   Elevated blood sugar 01/03/2016   History of chest pain 01/03/2016    ONSET DATE:01/24/2024  REFERRING DIAG:  Diagnosis  M65.949 (ICD-10-CM) - Flexor tenosynovitis of finger  R60.0 (ICD-10-CM) - Hand edema    THERAPY DIAG:  Stiffness  of right hand, not elsewhere classified  Scar condition and fibrosis of skin  Muscle weakness (generalized)  Other lack of coordination  Flexor tenosynovitis of finger  Rationale for Evaluation and Treatment: Rehabilitation  SUBJECTIVE:   SUBJECTIVE STATEMENT:  Pt reports work has been going fine.  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 Pt in agreement with today being his last visit for the time being.  Pt accompanied by: self  PERTINENT HISTORY: Elevated BP, type 2 diabetes mellitus with hyperglycemia, lumbar radiculopathy, flexor tenosynovitis of finger, abscess bursa of R hand, hx of chest pain, cellulitis, and dyslipidemia  03/02/24:  Pt seen by MD: for follow up 10 weeks status post Right long finger drainage of tendon sheath for flexor tenosynovitis, Right long finger PIP joint arthrotomy for infection with exploration and drainage, interphalangeal joint, and Incision and drainage of abscess dorsal hand.  Doing very well overall, has been doing OT as instructed with nice progress in both range of motion and strengthening.    Plan: He has done very well postoperatively.  He is very pleased with his progress.  At this juncture he would like to return to work without restriction which is appropriate.  Work note was provided today to resume next week and full duty without restriction.  He is welcome to return to me as needed moving forward.  He can  continue with OT as instructed with transition to home exercise program when appropriate.      PRECAUTIONS: Back ( lumbar radiculopathy chronic)  RED FLAGS: None   WEIGHT BEARING RESTRICTIONS: No  PAIN:  Are you having pain? No Mostly just cramping but not really painful   FALLS: Has patient fallen in last 6 months? No  LIVING ENVIRONMENT: Lives with: lives alone Lives in: House/apartment Stairs: External (porch stairs)  Has following equipment at home:  None  PLOF: Independent; The patient works at [employer] but is currently on leave due to the injury. He is still able to drive and complete his daily activities, although it takes him more time and he often compensates by using his left hand. He is currently also experiencing difficulty with writing.  PATIENT GOALS: The patients goal is to regain mobility and strength in his hand so he can return to work.  NEXT MD VISIT: NA  OBJECTIVE:  Note: Objective measures were completed at Evaluation unless otherwise noted.  HAND DOMINANCE: Right  ADLs: Overall ADLs: Overall, the patient is independent with all ADLs. However, because the affected hand is the patients dominant hand and he is experiencing pain and swelling, it takes him longer to complete tasks than usual. He often compensates by using his LUE.   Additionally, the patient has recently resumed light household tasks such as sweeping and mopping. However, he continues to have difficulty with activities that require grip strength, such as opening jars and water bottles, due to pain when attempting to make a fist.   FUNCTIONAL OUTCOME MEASURES: Quick Dash: 77.3% disability at eval 02/24/24: 20.5 % 03/23/24: 6.8%   UPPER EXTREMITY ROM:     Active ROM Right eval Left eval  Shoulder flexion    Shoulder abduction    Shoulder adduction    Shoulder extension    Shoulder internal rotation    Shoulder external rotation    Elbow flexion    Elbow extension    Wrist flexion Lacks full digital flexion when wrist is flexed WNL  Wrist extension Lacks full digital extension when wrist is extended WNL  Wrist ulnar deviation    Wrist radial deviation    Wrist pronation    Wrist supination    (Blank rows = not tested)  Active ROM Right eval Right 02/24/24 Left eval  Thumb MCP (0-60)     Thumb IP (0-80)     Thumb Radial abd/add (0-55)      Thumb Palmar abd/add (0-45)      Thumb Opposition to Small Finger      Index MCP (0-90)       Index PIP (0-100)      Index DIP (0-70)       Long MCP (0-90) 50*  60* 76*   Long PIP (0-100) 68 * 85* -13* ext  90*   Long DIP (0-70)  28* 58* 70*   Ring MCP (0-90)       Ring PIP (0-100)       Ring DIP (0-70)       Little MCP (0-90)       Little PIP (0-100)       Little DIP (0-70)        (Blank rows = not tested)  HAND FUNCTION:  One trial only at eval 01/28/24  Grip strength: Right: 14.5 lbs; Left: 99.4 lbs (LUE started at 77 with slow climb to 99 lbs) Pt experienced R hand pain when trying to grip the dynamometer.  02/24/24: Right: 42.3, 37.2, 31.7 lbs Left: 87.7, 82.8, 86.6 lbs Average: Right: 37.1 lbs; Left: 85.8 lbs  03/02/24:  Right: 40.1, 30.2, 25.3 Left: 86.6 lbs   03/16/24 Right: 58.2, 53.1, 66.1 Avg 59.1 lbs Left: 86.6  03/23/24 Right - ~ 60 prior to Fluido 71.4, 72.0, 73.4 Average: 72.3 lbs   COORDINATION: 9 Hole Peg test: Right: 1 minute and 36 sec; Left: 36 sec Pt had difficulty with picking up pegs with R hand due to pain when having to flex middle finger; dropped 1 peg.   02/24/24 Right: Right: 29.80 sec Left 31.29 sec  SENSATION: Eval: Pt reports feeling numbness on R middle finger.  02/24/24: Volar surface of hand at scar sites remains achy/uncomfortable.  EDEMA: Yes, right middle finger down to the palm area.   COGNITION: Overall cognitive status: Within functional limits for tasks assessed  OBSERVATIONS:  The patient presents with significant swelling and some stiffness in the right middle finger, which may be contributing to his pain and limiting the functional use of his RUE.   TODAY'S TREATMENT:  - Therapeutic Exercises completed for duration as noted below including:  Pt placed BUE in Fluidotherapy machine with supervised AAROM x 10 min. Pt was educated to complete AA/PROM and gripping during modality time to improve ROM and decrease pain/stiffness of affected extremity by use of the machine's massaging action and thermal  properties.   QuickDash questionnaire completed with pt score down to 6.8% now.  OT able to retest pt's grip strength before and after fluido with improvement from ~60lbs before on a particularly cold day to average of 72.3 lbs afterwards.  Pt provided info re: resistance bar purchase through [employer] for HEP along with provision of blue theraputty for strengthening at home.  Completed DC education re: continued use of Heat, stretches (due to limitations in full middle finger flexion/extension as well as continued HEP activities.  PATIENT EDUCATION: Education details: DC instructions Person educated: Patient Education method: Explanation, Demonstration, Tactile cues, and Verbal cues Education comprehension: verbalized understanding and returned demonstration  HOME EXERCISE PROGRAM: 01/28/24:  Tendon glides, scar massage, wrist/digit exercises- Access code; [email or web address] 02/03/24: Retrograde massage, sensory precautions 02/06/24: Joint protection handout, putty exercises- access code;[email or web address] 02/11/24: Coordination activities  02/17/24: Splint wear (hand-based finger full extension orthosis)   GOALS: Goals reviewed with patient? Yes  SHORT TERM GOALS: Target date: 02/28/2024  Patient will demonstrate initial R UE HEP with 25% verbal cues or less for proper execution.  Baseline: Received tendon glides, scar massage handout, and wrist/digit exercises at eval Goal status: MET  2.  The patient will verbalize understanding of edema management strategies and demonstrate a noticeable decrease in edema in the RUE.  Baseline: Significant swelling noted in R hand (middle finger), issued compressive finger sleeve at eval Goal status: MET  3.  Pt will independently recall at least 3 total joint protection, ergonomic, and body mechanic principles.   Baseline: Pt currently use R thumb to hold objects such as grocery bags.  Goal status: MET  4. Pt will independently recall the 5 main sensory  precautions (cold, heat, sharp, chemical, and heavy) as needed to prevent injury/harm secondary to impairments.   Baseline: Pt lacks sensation in R middle finger- received handout 02/03/24 Goal status: MET  5. Patient will verbalize two effective pain management techniques to use at home to improve functional use of RUE.  Baseline: Pt currently refrains from using his R hand due to the amount of pain when having to move the  middle finger.  Goal status: MET Take break/sleep; no meds; warm water; elevation, compression   LONG TERM GOALS: Target date: 03/27/2024   1. Patient will demonstrate updated R UE HEP with visual handouts only for proper execution.              Baseline: New to outpt OT             Goal status: MET  2.  Patient will demonstrate at least 65  lbs RUE grip strength x 3 trials as needed to open jars and other containers.  Baseline: 14.5 lbs (R hand) ; 99.4 lbs (L hand)  Goal status: MET  03/02/24 Average: Right: 37.1 lbs; Left: 85.8 lbs 03/16/24 Avg 59.1 lbs 03/23/24 Avg 72.3 lbs   3.  Patient will demo improved FM coordination as evidenced by completing nine-hole peg with use of RUE in 30 seconds or less.  Baseline: Right: 1 minute and 36 sec; Left: 36 sec Goal status: MET  4.The patient will increase AROM in digital flexion of middle finger (PIP,DIP, MCP) joints by 20 degrees in order to achieve full composite digital flexion, improving functional use of the hand during daily activities.             Baseline: RUE: MCP 50*, PIP 68*, DIP 28* versus LUE: MCP 76*, PIP 90*, DIP 70* Goal status: MET 02/24/24: RUE MCP 60* PIP 85* DIP 58* 03/23/24: RUE MCP 65* PIP 90* DIP 68*  5. Patient will demonstrate at least 30% improvement with quick Dash score (reporting <50% disability or less) indicating improved functional use of affected extremity.  Baseline: 77.3% disability  Goal status: MET 02/24/24: 20.5 % 03/23/24: 6.8%   ASSESSMENT:  CLINICAL IMPRESSION: Patient  is a 48 y.o. male who was seen today for occupational therapy treatment for flexor tenosynovitis of finger.  Therapist reviewed goals with patient and updated patient progression.  No additional functional limitations identified. Patient is appropriate for discharge and no longer demonstrates medical necessity for continued skilled occupational therapy services.  PERFORMANCE DEFICITS: in functional skills including ADLs, IADLs, coordination, dexterity, sensation, edema, ROM, strength, pain, Fine motor control, Gross motor control, endurance, decreased knowledge of precautions, decreased knowledge of use of DME, wound, skin integrity, and UE functional use.   IMPAIRMENTS: are limiting patient from ADLs, IADLs, rest and sleep, work, leisure, and social participation.   COMORBIDITIES: may have co-morbidities  that affects occupational performance. Patient will benefit from skilled OT to address above impairments and improve overall function.  PLAN:  OCCUPATIONAL THERAPY DISCHARGE SUMMARY  Visits from Start of Care: 12  Current functional level related to goals / functional outcomes: Pt has met all goals to satisfactory levels and is pleased with outcomes.   Remaining deficits: Pt has no more significant functional deficits or pain.   Education / Equipment: Pt has all needed materials and education. Pt understands how to continue on with self-management. See tx notes for more details.   Patient agrees to discharge due to max benefits received from outpatient occupational therapy / hand therapy at this time.    Clarita LITTIE Pride, OT 03/23/2024, 12:12 PM

## 2024-03-24 ENCOUNTER — Ambulatory Visit: Admitting: Occupational Therapy
